# Patient Record
Sex: Female | Born: 1950 | Race: White | Hispanic: No | Marital: Married | Smoking: Former smoker
Health system: Southern US, Community
[De-identification: ages and names within clinical notes are randomized; demographics above are authoritative.]

## PROBLEM LIST (undated history)

## (undated) DIAGNOSIS — J449 Chronic obstructive pulmonary disease, unspecified: Secondary | ICD-10-CM

## (undated) DIAGNOSIS — E669 Obesity, unspecified: Secondary | ICD-10-CM

## (undated) DIAGNOSIS — K219 Gastro-esophageal reflux disease without esophagitis: Secondary | ICD-10-CM

## (undated) DIAGNOSIS — I480 Paroxysmal atrial fibrillation: Secondary | ICD-10-CM

## (undated) DIAGNOSIS — I4892 Unspecified atrial flutter: Secondary | ICD-10-CM

## (undated) DIAGNOSIS — I341 Nonrheumatic mitral (valve) prolapse: Secondary | ICD-10-CM

## (undated) DIAGNOSIS — K635 Polyp of colon: Secondary | ICD-10-CM

## (undated) DIAGNOSIS — I340 Nonrheumatic mitral (valve) insufficiency: Secondary | ICD-10-CM

## (undated) DIAGNOSIS — E7800 Pure hypercholesterolemia, unspecified: Secondary | ICD-10-CM

## (undated) HISTORY — DX: Polyp of colon: K63.5

## (undated) HISTORY — PX: TUBAL LIGATION: SHX77

## (undated) HISTORY — DX: Obesity, unspecified: E66.9

## (undated) HISTORY — PX: ABDOMINAL HYSTERECTOMY: SHX81

## (undated) HISTORY — DX: Unspecified atrial flutter: I48.92

## (undated) HISTORY — DX: Nonrheumatic mitral (valve) insufficiency: I34.0

---

## 2009-09-03 ENCOUNTER — Ambulatory Visit: Payer: Self-pay | Admitting: Gastroenterology

## 2009-09-03 DIAGNOSIS — K625 Hemorrhage of anus and rectum: Secondary | ICD-10-CM

## 2009-10-01 ENCOUNTER — Ambulatory Visit: Payer: Self-pay | Admitting: Gastroenterology

## 2009-10-01 ENCOUNTER — Ambulatory Visit (HOSPITAL_COMMUNITY): Admission: RE | Admit: 2009-10-01 | Discharge: 2009-10-01 | Payer: Self-pay | Admitting: Family Medicine

## 2009-10-01 LAB — HM COLONOSCOPY

## 2009-10-02 ENCOUNTER — Emergency Department (HOSPITAL_COMMUNITY): Admission: EM | Admit: 2009-10-02 | Discharge: 2009-10-02 | Payer: Self-pay | Admitting: Emergency Medicine

## 2009-10-05 ENCOUNTER — Encounter (INDEPENDENT_AMBULATORY_CARE_PROVIDER_SITE_OTHER): Payer: Self-pay

## 2009-12-03 ENCOUNTER — Encounter (INDEPENDENT_AMBULATORY_CARE_PROVIDER_SITE_OTHER): Payer: Self-pay | Admitting: *Deleted

## 2009-12-05 ENCOUNTER — Telehealth (INDEPENDENT_AMBULATORY_CARE_PROVIDER_SITE_OTHER): Payer: Self-pay | Admitting: *Deleted

## 2010-02-07 ENCOUNTER — Emergency Department (HOSPITAL_COMMUNITY): Admission: EM | Admit: 2010-02-07 | Discharge: 2010-02-07 | Payer: Self-pay | Admitting: Emergency Medicine

## 2010-12-03 NOTE — Progress Notes (Signed)
  Phone Note Call from Patient   Action Taken: Phone Call Completed Details for Reason: called regarding f/u appt  Summary of Call: Pt called stating she recieved letter in the mail for follow up appt. She stated that she was " doing fine & did not feel like she needed another appt at this time."  I advised pt to call back if anything changed. cdg   Initial call taken by: Peggyann Shoals,  December 05, 2009 1:21 PM

## 2010-12-03 NOTE — Letter (Signed)
Summary: Appointment Reminder  [facility]  [address]   [place], [place] [address]   Phone: [phone]  Fax: [phone]       December 03, 2009   [name] [address] [place], [place]  [address] 1951-04-29    Dear Ms. Burlingame,  We have been unable to reach you by phone to schedule a follow up   appointment that was recommended for you by Dr. Jena Gauss. It is very   important that we reach you to schedule an appointment. We hope that you  allow [place] to participate in your health care needs. Please contact us at  [phone] at your earliest convenience to schedule your appointment.  Sincerely,    [facility] R. Roetta Sessions, M.D.    Kassie Mends, M.D. Lorenza Burton, FNP-BC    Tana Coast, PA-C Phone: [phone]    Fax: [phone]

## 2011-01-22 LAB — CBC
HCT: 36.7 % (ref 36.0–46.0)
MCHC: 34.8 g/dL (ref 30.0–36.0)
MCV: 87.5 fL (ref 78.0–100.0)
Platelets: 221 10*3/uL (ref 150–400)
WBC: 14.4 10*3/uL — ABNORMAL HIGH (ref 4.0–10.5)

## 2011-01-22 LAB — COMPREHENSIVE METABOLIC PANEL
ALT: 22 U/L (ref 0–35)
Albumin: 3.6 g/dL (ref 3.5–5.2)
Alkaline Phosphatase: 75 U/L (ref 39–117)
CO2: 25 mEq/L (ref 19–32)
Creatinine, Ser: 0.74 mg/dL (ref 0.4–1.2)
GFR calc non Af Amer: >60 mL/min (ref >60)
Glucose, Bld: 149 mg/dL — ABNORMAL HIGH (ref 70–99)
Total Protein: 6.9 g/dL (ref 6.0–8.3)

## 2011-01-22 LAB — DIFFERENTIAL
Basophils Absolute: 0 10*3/uL (ref 0.0–0.1)
Basophils Relative: 0 % (ref 0–1)
Lymphs Abs: 0.5 10*3/uL — ABNORMAL LOW (ref 0.7–4.0)
Monocytes Relative: 2 % — ABNORMAL LOW (ref 3–12)
Neutrophils Relative %: 94 % — ABNORMAL HIGH (ref 43–77)

## 2011-01-22 LAB — URINALYSIS, ROUTINE W REFLEX MICROSCOPIC
Bilirubin Urine: NEGATIVE
Ketones, ur: NEGATIVE mg/dL
Specific Gravity, Urine: 1.015 (ref 1.005–1.030)
pH: 7.5 (ref 5.0–8.0)

## 2013-08-14 ENCOUNTER — Emergency Department (HOSPITAL_COMMUNITY): Payer: BC Managed Care – PPO

## 2013-08-14 ENCOUNTER — Encounter (HOSPITAL_COMMUNITY): Payer: Self-pay | Admitting: Emergency Medicine

## 2013-08-14 ENCOUNTER — Inpatient Hospital Stay (HOSPITAL_COMMUNITY)
Admission: EM | Admit: 2013-08-14 | Discharge: 2013-08-16 | DRG: 584 | Disposition: A | Discharge status: Home / Self Care | Payer: BC Managed Care – PPO | Attending: Internal Medicine | Admitting: Internal Medicine

## 2013-08-14 DIAGNOSIS — E669 Obesity, unspecified: Secondary | ICD-10-CM | POA: Diagnosis present

## 2013-08-14 DIAGNOSIS — A419 Sepsis, unspecified organism: Principal | ICD-10-CM

## 2013-08-14 DIAGNOSIS — E7800 Pure hypercholesterolemia, unspecified: Secondary | ICD-10-CM | POA: Diagnosis present

## 2013-08-14 DIAGNOSIS — D649 Anemia, unspecified: Secondary | ICD-10-CM

## 2013-08-14 DIAGNOSIS — K219 Gastro-esophageal reflux disease without esophagitis: Secondary | ICD-10-CM | POA: Diagnosis present

## 2013-08-14 DIAGNOSIS — K625 Hemorrhage of anus and rectum: Secondary | ICD-10-CM

## 2013-08-14 DIAGNOSIS — D72829 Elevated white blood cell count, unspecified: Secondary | ICD-10-CM | POA: Diagnosis present

## 2013-08-14 DIAGNOSIS — E538 Deficiency of other specified B group vitamins: Secondary | ICD-10-CM | POA: Diagnosis present

## 2013-08-14 DIAGNOSIS — J449 Chronic obstructive pulmonary disease, unspecified: Secondary | ICD-10-CM | POA: Diagnosis present

## 2013-08-14 DIAGNOSIS — I059 Rheumatic mitral valve disease, unspecified: Secondary | ICD-10-CM | POA: Diagnosis present

## 2013-08-14 DIAGNOSIS — J189 Pneumonia, unspecified organism: Secondary | ICD-10-CM | POA: Diagnosis present

## 2013-08-14 DIAGNOSIS — J9611 Chronic respiratory failure with hypoxia: Secondary | ICD-10-CM | POA: Diagnosis present

## 2013-08-14 DIAGNOSIS — J9600 Acute respiratory failure, unspecified whether with hypoxia or hypercapnia: Secondary | ICD-10-CM

## 2013-08-14 DIAGNOSIS — R6510 Systemic inflammatory response syndrome (SIRS) of non-infectious origin without acute organ dysfunction: Secondary | ICD-10-CM

## 2013-08-14 DIAGNOSIS — J9691 Respiratory failure, unspecified with hypoxia: Secondary | ICD-10-CM

## 2013-08-14 DIAGNOSIS — T380X5A Adverse effect of glucocorticoids and synthetic analogues, initial encounter: Secondary | ICD-10-CM | POA: Diagnosis present

## 2013-08-14 DIAGNOSIS — J4489 Other specified chronic obstructive pulmonary disease: Secondary | ICD-10-CM | POA: Diagnosis present

## 2013-08-14 DIAGNOSIS — I4891 Unspecified atrial fibrillation: Secondary | ICD-10-CM | POA: Diagnosis present

## 2013-08-14 DIAGNOSIS — Z8249 Family history of ischemic heart disease and other diseases of the circulatory system: Secondary | ICD-10-CM

## 2013-08-14 DIAGNOSIS — Z87891 Personal history of nicotine dependence: Secondary | ICD-10-CM

## 2013-08-14 DIAGNOSIS — E876 Hypokalemia: Secondary | ICD-10-CM | POA: Diagnosis present

## 2013-08-14 HISTORY — DX: Pure hypercholesterolemia, unspecified: E78.00

## 2013-08-14 HISTORY — DX: Paroxysmal atrial fibrillation: I48.0

## 2013-08-14 HISTORY — DX: Chronic obstructive pulmonary disease, unspecified: J44.9

## 2013-08-14 HISTORY — DX: Nonrheumatic mitral (valve) prolapse: I34.1

## 2013-08-14 LAB — COMPREHENSIVE METABOLIC PANEL
ALT: 17 U/L (ref 0–35)
AST: 20 U/L (ref 0–37)
Alkaline Phosphatase: 73 U/L (ref 39–117)
CO2: 28 mEq/L (ref 19–32)
GFR calc Af Amer: >90 mL/min (ref >90)
GFR calc non Af Amer: 88 mL/min — ABNORMAL LOW (ref >90)
Potassium: 3.8 mEq/L (ref 3.5–5.1)
Total Bilirubin: 1.2 mg/dL (ref 0.3–1.2)

## 2013-08-14 LAB — URINALYSIS, ROUTINE W REFLEX MICROSCOPIC
Specific Gravity, Urine: 1.015 (ref 1.005–1.030)
pH: 6.5 (ref 5.0–8.0)

## 2013-08-14 LAB — CBC WITH DIFFERENTIAL/PLATELET
Basophils Absolute: 0 10*3/uL (ref 0.0–0.1)
Basophils Relative: 0 % (ref 0–1)
Eosinophils Absolute: 0 10*3/uL (ref 0.0–0.7)
Lymphocytes Relative: 5 % — ABNORMAL LOW (ref 12–46)
MCH: 30.1 pg (ref 26.0–34.0)
MCHC: 33.2 g/dL (ref 30.0–36.0)
MCV: 90.8 fL (ref 78.0–100.0)
Monocytes Relative: 4 % (ref 3–12)
Neutro Abs: 15.9 10*3/uL — ABNORMAL HIGH (ref 1.7–7.7)
Platelets: 229 10*3/uL (ref 150–400)
RBC: 4.35 MIL/uL (ref 3.87–5.11)
WBC: 17.5 10*3/uL — ABNORMAL HIGH (ref 4.0–10.5)

## 2013-08-14 MED ORDER — LEVOFLOXACIN IN D5W 750 MG/150ML IV SOLN
750.0000 mg | INTRAVENOUS | Status: DC
  Administered 2013-08-15 – 2013-08-16 (×2): 750 mg via INTRAVENOUS
  Filled 2013-08-14 (×2): qty 150

## 2013-08-14 MED ORDER — DEXTROSE 5 % IV SOLN
1.0000 g | Freq: Once | INTRAVENOUS | Status: AC
  Administered 2013-08-14: 1 g via INTRAVENOUS
  Filled 2013-08-14: qty 10

## 2013-08-14 MED ORDER — ONDANSETRON HCL 4 MG/2ML IJ SOLN: 4.0000 mg | Freq: Four times a day (QID) | INTRAMUSCULAR | Status: DC | PRN

## 2013-08-14 MED ORDER — ONDANSETRON HCL 4 MG PO TABS: 4.0000 mg | ORAL_TABLET | Freq: Four times a day (QID) | ORAL | Status: DC | PRN

## 2013-08-14 MED ORDER — SODIUM CHLORIDE 0.9 % IV BOLUS (SEPSIS)
1000.0000 mL | Freq: Once | INTRAVENOUS | Status: AC
  Administered 2013-08-14: 1000 mL via INTRAVENOUS

## 2013-08-14 MED ORDER — ALBUTEROL SULFATE (5 MG/ML) 0.5% IN NEBU
5.0000 mg | INHALATION_SOLUTION | RESPIRATORY_TRACT | Status: AC
  Administered 2013-08-14: 5 mg via RESPIRATORY_TRACT
  Filled 2013-08-14: qty 1

## 2013-08-14 MED ORDER — SODIUM CHLORIDE 0.9 % IJ SOLN
3.0000 mL | Freq: Two times a day (BID) | INTRAMUSCULAR | Status: DC
  Administered 2013-08-15: 3 mL via INTRAVENOUS

## 2013-08-14 MED ORDER — VANCOMYCIN HCL IN DEXTROSE 1-5 GM/200ML-% IV SOLN
1000.0000 mg | Freq: Once | INTRAVENOUS | Status: DC
  Administered 2013-08-14: 1000 mg via INTRAVENOUS
  Filled 2013-08-14: qty 200

## 2013-08-14 MED ORDER — DEXTROSE 5 % IV SOLN
500.0000 mg | INTRAVENOUS | Status: DC
  Administered 2013-08-14: 500 mg via INTRAVENOUS

## 2013-08-14 MED ORDER — SODIUM CHLORIDE 0.9 % IV SOLN
INTRAVENOUS | Status: AC
  Administered 2013-08-14: 21:00:00 via INTRAVENOUS

## 2013-08-14 MED ORDER — ALBUTEROL SULFATE (5 MG/ML) 0.5% IN NEBU
2.5000 mg | INHALATION_SOLUTION | RESPIRATORY_TRACT | Status: DC | PRN
  Filled 2013-08-14: qty 0.5

## 2013-08-14 MED ORDER — ACETAMINOPHEN 500 MG PO TABS
1000.0000 mg | ORAL_TABLET | Freq: Once | ORAL | Status: AC
  Administered 2013-08-14: 1000 mg via ORAL
  Filled 2013-08-14: qty 2

## 2013-08-14 MED ORDER — SODIUM CHLORIDE 0.9 % IV BOLUS (SEPSIS)
500.0000 mL | Freq: Once | INTRAVENOUS | Status: AC
  Administered 2013-08-14: 500 mL via INTRAVENOUS

## 2013-08-14 MED ORDER — MOMETASONE FURO-FORMOTEROL FUM 100-5 MCG/ACT IN AERO
INHALATION_SPRAY | RESPIRATORY_TRACT | Status: AC
  Filled 2013-08-14: qty 8.8

## 2013-08-14 MED ORDER — ONDANSETRON HCL 4 MG/2ML IJ SOLN
4.0000 mg | Freq: Once | INTRAMUSCULAR | Status: AC
Start: 2013-08-14 — End: 2013-08-14
  Administered 2013-08-14: 4 mg via INTRAVENOUS
  Filled 2013-08-14: qty 2

## 2013-08-14 MED ORDER — ACETAMINOPHEN 650 MG RE SUPP: 650.0000 mg | Freq: Four times a day (QID) | RECTAL | Status: DC | PRN

## 2013-08-14 MED ORDER — ALBUTEROL SULFATE (5 MG/ML) 0.5% IN NEBU
2.5000 mg | INHALATION_SOLUTION | Freq: Four times a day (QID) | RESPIRATORY_TRACT | Status: DC
  Administered 2013-08-14 – 2013-08-16 (×7): 2.5 mg via RESPIRATORY_TRACT
  Filled 2013-08-14 (×6): qty 0.5

## 2013-08-14 MED ORDER — SODIUM CHLORIDE 0.9 % IV SOLN
INTRAVENOUS | Status: AC
  Administered 2013-08-14: 20:00:00 via INTRAVENOUS

## 2013-08-14 MED ORDER — GUAIFENESIN ER 600 MG PO TB12
600.0000 mg | ORAL_TABLET | Freq: Two times a day (BID) | ORAL | Status: DC
  Administered 2013-08-14 – 2013-08-16 (×4): 600 mg via ORAL
  Filled 2013-08-14 (×4): qty 1

## 2013-08-14 MED ORDER — HYDROCORTISONE SOD SUCCINATE 100 MG IJ SOLR
50.0000 mg | Freq: Four times a day (QID) | INTRAMUSCULAR | Status: DC
  Administered 2013-08-14 – 2013-08-15 (×3): 50 mg via INTRAVENOUS
  Filled 2013-08-14 (×3): qty 2

## 2013-08-14 MED ORDER — TIOTROPIUM BROMIDE MONOHYDRATE 18 MCG IN CAPS
18.0000 ug | ORAL_CAPSULE | Freq: Every day | RESPIRATORY_TRACT | Status: DC
  Administered 2013-08-15 – 2013-08-16 (×2): 18 ug via RESPIRATORY_TRACT
  Filled 2013-08-14: qty 5

## 2013-08-14 MED ORDER — MONTELUKAST SODIUM 10 MG PO TABS
10.0000 mg | ORAL_TABLET | Freq: Every day | ORAL | Status: DC
  Administered 2013-08-15: 10 mg via ORAL
  Filled 2013-08-14: qty 1

## 2013-08-14 MED ORDER — ENOXAPARIN SODIUM 40 MG/0.4ML ~~LOC~~ SOLN
40.0000 mg | SUBCUTANEOUS | Status: DC
  Administered 2013-08-14 – 2013-08-15 (×2): 40 mg via SUBCUTANEOUS
  Filled 2013-08-14 (×3): qty 0.4

## 2013-08-14 MED ORDER — TIOTROPIUM BROMIDE MONOHYDRATE 18 MCG IN CAPS
ORAL_CAPSULE | RESPIRATORY_TRACT | Status: AC
  Filled 2013-08-14: qty 5

## 2013-08-14 MED ORDER — MOMETASONE FURO-FORMOTEROL FUM 100-5 MCG/ACT IN AERO
2.0000 | INHALATION_SPRAY | Freq: Two times a day (BID) | RESPIRATORY_TRACT | Status: DC
  Administered 2013-08-14 – 2013-08-16 (×4): 2 via RESPIRATORY_TRACT
  Filled 2013-08-14: qty 8.8

## 2013-08-14 MED ORDER — ACETAMINOPHEN 325 MG PO TABS
650.0000 mg | ORAL_TABLET | Freq: Four times a day (QID) | ORAL | Status: DC | PRN
  Administered 2013-08-16: 650 mg via ORAL
  Filled 2013-08-14: qty 2

## 2013-08-14 MED ORDER — ASPIRIN 325 MG PO TABS
325.0000 mg | ORAL_TABLET | Freq: Every day | ORAL | Status: DC
  Administered 2013-08-15 – 2013-08-16 (×2): 325 mg via ORAL
  Filled 2013-08-14 (×2): qty 1

## 2013-08-14 NOTE — ED Provider Notes (Addendum)
CSN: [id number]     Arrival date & time 08/14/13  1621 History   First MD Initiated Contact with Patient 08/14/13 1639    Scribed for Vida Roller, MD, the patient was seen in room APA01/APA01. This chart was scribed by Lewanda Rife, ED scribe. Patient's care was started at 6:24 PM  Chief Complaint  Patient presents with  . Emesis  . Shortness of Breath   (Consider location/radiation/quality/duration/timing/severity/associated sxs/prior Treatment) The history is provided by the patient. No language interpreter was used.   HPI Comments: [name] is a 62 y.o. female who presents to the Emergency Department with PMHx of COPD, and mitral valve prolapse complaining of constant nausea onset 9 am this morning upon waking up. Reports associated chills, fever, shortness of breath, cough, loose stools (x 2), nasal congestion, headaches, and otalgia.  Denies any aggravating or alleviating factors. Denies associated emesis, dysuria, bloody stools, sore throat, abdominal pain, and rash. Denies recent foreign travels.   On his breath started this morning at 9:00, persistent, severe, nothing makes it better or worse. Past Medical History  Diagnosis Date  . COPD (chronic obstructive pulmonary disease)   . Hypercholesteremia   . Mitral valve prolapse    Past Surgical History  Procedure Laterality Date  . Abdominal hysterectomy    . Cesarean section     History reviewed. No pertinent family history. History  Substance Use Topics  . Smoking status: Former Games developer  . Smokeless tobacco: Not on file  . Alcohol Use: No   OB History   Grav Para Term Preterm Abortions TAB SAB Ect Mult Living                 Review of Systems  Constitutional: Positive for fever.  HENT: Positive for ear pain.   Respiratory: Positive for cough and shortness of breath.   Gastrointestinal: Positive for nausea. Negative for vomiting and abdominal pain.  Genitourinary: Negative for dysuria.  Skin:  Negative for rash.  Neurological: Positive for headaches.  Psychiatric/Behavioral: Negative for confusion.  All other systems reviewed and are negative.   A complete 10 system review of systems was obtained and all systems are negative except as noted in the HPI and PMHx.    Allergies  Penicillins  Home Medications   Current Outpatient Rx  Name  Route  Sig  Dispense  Refill  . albuterol (PROAIR HFA) 108 (90 BASE) MCG/ACT inhaler   Inhalation   Inhale 2 puffs into the lungs every 6 (six) hours as needed for wheezing or shortness of breath.         Marland Kitchen aspirin 325 MG tablet   Oral   Take 325 mg by mouth daily.         . Cholecalciferol (VITAMIN D3) 5000 UNITS CAPS   Oral   Take 1 capsule by mouth daily.         Marland Kitchen CRANBERRY PO   Oral   Take 1 capsule by mouth daily.         . Fluticasone-Salmeterol (ADVAIR) 250-50 MCG/DOSE AEPB   Inhalation   Inhale 1 puff into the lungs every 12 (twelve) hours.         Marland Kitchen levofloxacin (LEVAQUIN) 500 MG tablet   Oral   Take 500 mg by mouth daily.         . meloxicam (MOBIC) 7.5 MG tablet   Oral   Take 7.5 mg by mouth daily.         . metoprolol (  LOPRESSOR) 50 MG tablet   Oral   Take 25-50 mg by mouth 2 (two) times daily. Patient takes 1 tablet in the morning and 1/2 tablet at night         . montelukast (SINGULAIR) 10 MG tablet   Oral   Take 10 mg by mouth at bedtime.         . niacin (NIASPAN) 500 MG CR tablet   Oral   Take 500 mg by mouth at bedtime.         . Omega-3 Krill Oil 300 MG CAPS   Oral   Take 1 capsule by mouth daily.         . predniSONE (DELTASONE) 10 MG tablet   Oral   Take 10 mg by mouth daily.         Marland Kitchen tiotropium (SPIRIVA) 18 MCG inhalation capsule   Inhalation   Place 18 mcg into inhaler and inhale daily.          BP 117/64  Pulse 102  Temp(Src) 101.2 F (38.4 C) (Oral)  Resp 22  Ht 5\' 6"  (1.676 m)  Wt 211 lb (95.709 kg)  BMI 34.07 kg/m2  SpO2 96% Physical Exam   Nursing note and vitals reviewed. Constitutional: She is oriented to person, place, and time. She appears well-developed and well-nourished. No distress.  Appears uncomfortable. Obese.   HENT:  Head: Normocephalic and atraumatic.  Eyes: EOM are normal.  Neck: Normal range of motion. Neck supple. No tracheal deviation present.  No meningismus   Cardiovascular: Regular rhythm.  Tachycardia present.   No murmur heard. Pulmonary/Chest: Effort normal. Not tachypneic. No respiratory distress. She has rales.  Right lower lobe with occasional crackles - slight retractions, mild tachypnea, hypoxia to 87% on room air on initial arrival, up to 92% with deep breathing  Abdominal: Soft. There is no tenderness.  Musculoskeletal: Normal range of motion.  Lymphadenopathy:    She has no cervical adenopathy.  Neurological: She is alert and oriented to person, place, and time.  Skin: Skin is warm and dry.  Psychiatric: She has a normal mood and affect. Her behavior is normal.    ED Course  Procedures (including critical care time) DIAGNOSTIC STUDIES: Oxygen Saturation is 91% on room air, adequate by my interpretation.    COORDINATION OF CARE:  Nursing notes reviewed. Vital signs reviewed. Initial pt interview and examination performed.   6:24 PM-Discussed work up plan with pt at bedside, which includes UA, CBC with diff panel, and CMP. Pt agrees with plan.   Treatment plan initiated: Medications  cefTRIAXone (ROCEPHIN) 1 g in dextrose 5 % 50 mL IVPB (1 g Intravenous New Bag/Given 08/14/13 1805)  azithromycin (ZITHROMAX) 500 mg in dextrose 5 % 250 mL IVPB (not administered)  vancomycin (VANCOCIN) IVPB 1000 mg/200 mL premix (not administered)  ondansetron (ZOFRAN) injection 4 mg (4 mg Intravenous Given 08/14/13 1715)  sodium chloride 0.9 % bolus 1,000 mL (0 mLs Intravenous Stopped 08/14/13 1805)  acetaminophen (TYLENOL) tablet 1,000 mg (1,000 mg Oral Given 08/14/13 1717)  albuterol (PROVENTIL)  (5 MG/ML) 0.5% nebulizer solution 5 mg (5 mg Nebulization Given 08/14/13 1725)     Initial diagnostic testing ordered.    Labs Review Labs Reviewed  CBC WITH DIFFERENTIAL - Abnormal; Notable for the following:    WBC 17.5 (*)    Neutrophils Relative % 91 (*)    Neutro Abs 15.9 (*)    Lymphocytes Relative 5 (*)    All other components within normal  limits  COMPREHENSIVE METABOLIC PANEL - Abnormal; Notable for the following:    Sodium 134 (*)    Chloride 94 (*)    Glucose, Bld 150 (*)    Albumin 3.3 (*)    GFR calc non Af Amer 88 (*)    All other components within normal limits  URINE CULTURE  CULTURE, BLOOD (ROUTINE X 2)  CULTURE, BLOOD (ROUTINE X 2)  LACTIC ACID, PLASMA  URINALYSIS, ROUTINE W REFLEX MICROSCOPIC   Imaging Review Dg Chest 2 View  08/14/2013   CLINICAL DATA:  Mitral valve prolapse. Nausea and vomiting with weakness.  EXAM: CHEST  2 VIEW  COMPARISON:  07/29/2013.  FINDINGS: Normal heart size. Clear left lung. Patchy infiltrates involve the right upper and right middle lobe without effusion or pneumothorax. Stable mild hyperinflation. No osseous findings. Worsened aeration from priors.  IMPRESSION: Early right upper and right middle lobe infiltrates, new from priors.   Electronically Signed   By: Davonna Belling M.D.   On: 08/14/2013 17:02    EKG Interpretation   None       MDM   1. Sepsis   2. CAP (community acquired pneumonia)    Is in respiratory distress with tachypnea, retractions and hypoxia. She has a fever, tachycardia and has a leukocytosis of more than 17,000. This is consistent with systemic inflammatory response syndrome.  On two-view chest x-ray PA and lateral views revealed that she has right-sided infiltrates consistent with multifocal pneumonia which is likely the source of her infection. This pushes her diagnosis into a sepsis category and makes me much more concerned about the patient. Lactic acid  is 2.0, oxygenation is improved with 2 L nasal  cannula and after nebulized treatment she is improved with decreased work of breathing.  Reexamined multiple times with improvement each time.  I discussed her care with the hospitalist who agrees that the patient needs to be admitted to the hospital, and has given permission for holding orders, broad-spectrum antibiotics ordered, care discussed with the patient who is in agreement with the plan  CRITICAL CARE Performed by: Vida Roller Total critical care time: 35 Critical care time was exclusive of separately billable procedures and treating other patients. Critical care was necessary to treat or prevent imminent or life-threatening deterioration. Critical care was time spent personally by me on the following activities: development of treatment plan with patient and/or surrogate as well as nursing, discussions with consultants, evaluation of patient's response to treatment, examination of patient, obtaining history from patient or surrogate, ordering and performing treatments and interventions, ordering and review of laboratory studies, ordering and review of radiographic studies, pulse oximetry and re-evaluation of patient's condition.    I personally performed the services described in this documentation, which was scribed in my presence. The recorded information has been reviewed and is accurate.     Vida Roller, MD 08/14/13 1824  Vida Roller, MD 08/14/13 [phone]

## 2013-08-14 NOTE — ED Notes (Signed)
Pt c/o chills, nausea.no vomiting.  States woke up like this this morning. Nad. Alert/oriented. Sob noted with labored breathing noted. Cough with green sputum.

## 2013-08-14 NOTE — H&P (Signed)
[facility] History and Physical  [name] MVH:[id number] DOB: 08-01-1951 DOA: 08/14/2013   PCP: Samuel Jester, DO  Specialists: None  Chief Complaint: Shortness of breath, cough, chills since this morning  HPI: [name] is a 62 y.o. female with a past medical history of COPD, hypercholesterolemia, mitral valve prolapse, who was in her usual state of health this morning when she woke up and took a bath and then subsequently started feeling weak and short of breath. She started having a cough, which was mostly dry. She does have a chronic cough, but this was worse than normal. She had chills and thinks she may have had a fever, but did not check her temperature at home. She had the nausea, with dry heaves. She denies any dizziness or lightheadedness. No sick contacts including persons from [place]. No recent travel. She is due for a flu shot this year and thinks she is up-to-date on pneumonia vaccine, although she is not sure. She denies any chest pain, no abdominal pain. No diarrhea. She had Levaquin left over from a previous prescription, so she took one tablet this morning. She has been taking prednisone on a tapering dose for the last one month for her COPD and has a few tablets left.  Home Medications: Prior to Admission medications   Medication Sig Start Date End Date Taking? Authorizing Provider  albuterol (PROAIR HFA) 108 (90 BASE) MCG/ACT inhaler Inhale 2 puffs into the lungs every 6 (six) hours as needed for wheezing or shortness of breath.   Yes Historical Provider, MD  aspirin 325 MG tablet Take 325 mg by mouth daily.   Yes Historical Provider, MD  Cholecalciferol (VITAMIN D3) 5000 UNITS CAPS Take 1 capsule by mouth daily.   Yes Historical Provider, MD  CRANBERRY PO Take 1 capsule by mouth daily.   Yes Historical Provider, MD  Fluticasone-Salmeterol (ADVAIR) 250-50 MCG/DOSE AEPB Inhale 1 puff into the lungs every 12 (twelve) hours.   Yes Historical Provider, MD   levofloxacin (LEVAQUIN) 500 MG tablet Take 500 mg by mouth daily.   Yes Historical Provider, MD  meloxicam (MOBIC) 7.5 MG tablet Take 7.5 mg by mouth daily.   Yes Historical Provider, MD  metoprolol (LOPRESSOR) 50 MG tablet Take 25-50 mg by mouth 2 (two) times daily. Patient takes 1 tablet in the morning and 1/2 tablet at night   Yes Historical Provider, MD  montelukast (SINGULAIR) 10 MG tablet Take 10 mg by mouth at bedtime.   Yes Historical Provider, MD  niacin (NIASPAN) 500 MG CR tablet Take 500 mg by mouth at bedtime.   Yes Historical Provider, MD  Omega-3 Krill Oil 300 MG CAPS Take 1 capsule by mouth daily.   Yes Historical Provider, MD  predniSONE (DELTASONE) 10 MG tablet Take 10 mg by mouth daily.   Yes Historical Provider, MD  tiotropium (SPIRIVA) 18 MCG inhalation capsule Place 18 mcg into inhaler and inhale daily.   Yes Historical Provider, MD    Allergies:  Allergies  Allergen Reactions  . Penicillins Rash    Past Medical History: Past Medical History  Diagnosis Date  . COPD (chronic obstructive pulmonary disease)   . Hypercholesteremia   . Mitral valve prolapse     Past Surgical History  Procedure Laterality Date  . Abdominal hysterectomy    . Cesarean section      Social History:  reports that she quit smoking about 23 years ago. She does not have any smokeless tobacco history on file. She reports  that she does not drink alcohol or use illicit drugs.  Living Situation: She lives in [place] with her husband. She is employed as a Environmental health practitioner Activity Level: Independent with daily activities   Family History:  Family History  Problem Relation Age of Onset  . Heart attack Mother   . Emphysema Father      Review of Systems - History obtained from the patient General ROS: positive for  - chills and fatigue Psychological ROS: negative Ophthalmic ROS: negative ENT ROS: negative Allergy and Immunology ROS: negative Hematological and Lymphatic ROS:  negative Endocrine ROS: negative Respiratory ROS: as in hpi Cardiovascular ROS: no chest pain or dyspnea on exertion Gastrointestinal ROS: no abdominal pain, change in bowel habits, or black or bloody stools Genito-Urinary ROS: no dysuria, trouble voiding, or hematuria Musculoskeletal ROS: negative Neurological ROS: no TIA or stroke symptoms Dermatological ROS: negative  Physical Examination  Filed Vitals:   08/14/13 1805 08/14/13 1837 08/14/13 1839 08/14/13 1916  BP: 117/64 85/49 80/46  94/61  Pulse: 102 102 105 90  Temp:    98.3 F (36.8 C)  TempSrc:      Resp: 22 20 19 18   Height:    5\' 6"  (1.676 m)  Weight:    97.7 kg (215 lb 6.2 oz)  SpO2: 96% 94% 95% 97%    General appearance: alert, cooperative, appears stated age, no distress and moderately obese Head: Normocephalic, without obvious abnormality, atraumatic Eyes: conjunctivae/corneas clear. PERRL, EOM's intact. Throat: Dry mucous membranes Neck: no adenopathy, no carotid bruit, no JVD, supple, symmetrical, trachea midline and thyroid not enlarged, symmetric, no tenderness/mass/nodules Resp: She has rhonchi and a few crackles at the right lower lung. Few scattered wheezes. Cardio: regular rate and rhythm, S1, S2 normal, no murmur, click, rub or gallop GI: soft, non-tender; bowel sounds normal; no masses,  no organomegaly Extremities: extremities normal, atraumatic, no cyanosis or edema Pulses: 2+ and symmetric Skin: Skin color, texture, turgor normal. No rashes or lesions Neurologic: She is alert and oriented x3. Cranial nerves are intact. Motor strength is equal, bilateral upper and lower extremities.  Laboratory Data: Results for orders placed during the hospital encounter of 08/14/13 (from the past 48 hour(s))  CBC WITH DIFFERENTIAL     Status: Abnormal   Collection Time    08/14/13  5:16 PM      Result Value Range   WBC 17.5 (*) 4.0 - 10.5 K/uL   RBC 4.35  3.87 - 5.11 MIL/uL   Hemoglobin 13.1  12.0 - 15.0 g/dL    HCT 96.0  45.4 - 09.8 %   MCV 90.8  78.0 - 100.0 fL   MCH 30.1  26.0 - 34.0 pg   MCHC 33.2  30.0 - 36.0 g/dL   RDW 11.9  14.7 - 82.9 %   Platelets 229  150 - 400 K/uL   Neutrophils Relative % 91 (*) 43 - 77 %   Neutro Abs 15.9 (*) 1.7 - 7.7 K/uL   Lymphocytes Relative 5 (*) 12 - 46 %   Lymphs Abs 0.8  0.7 - 4.0 K/uL   Monocytes Relative 4  3 - 12 %   Monocytes Absolute 0.8  0.1 - 1.0 K/uL   Eosinophils Relative 0  0 - 5 %   Eosinophils Absolute 0.0  0.0 - 0.7 K/uL   Basophils Relative 0  0 - 1 %   Basophils Absolute 0.0  0.0 - 0.1 K/uL  COMPREHENSIVE METABOLIC PANEL     Status: Abnormal  Collection Time    08/14/13  5:16 PM      Result Value Range   Sodium 134 (*) 135 - 145 mEq/L   Potassium 3.8  3.5 - 5.1 mEq/L   Chloride 94 (*) 96 - 112 mEq/L   CO2 28  19 - 32 mEq/L   Glucose, Bld 150 (*) 70 - 99 mg/dL   BUN 12  6 - 23 mg/dL   Creatinine, Ser 4.09  0.50 - 1.10 mg/dL   Calcium 9.3  8.4 - 81.1 mg/dL   Total Protein 6.8  6.0 - 8.3 g/dL   Albumin 3.3 (*) 3.5 - 5.2 g/dL   AST 20  0 - 37 U/L   ALT 17  0 - 35 U/L   Alkaline Phosphatase 73  39 - 117 U/L   Total Bilirubin 1.2  0.3 - 1.2 mg/dL   GFR calc non Af Amer 88 (*) >90 mL/min   GFR calc Af Amer >90  >90 mL/min   Comment: (NOTE)     The eGFR has been calculated using the CKD EPI equation.     This calculation has not been validated in all clinical situations.     eGFR's persistently <90 mL/min signify possible Chronic Kidney     Disease.  LACTIC ACID, PLASMA     Status: None   Collection Time    08/14/13  5:17 PM      Result Value Range   Lactic Acid, Venous 2.0  0.5 - 2.2 mmol/L  CULTURE, BLOOD (ROUTINE X 2)     Status: None   Collection Time    08/14/13  5:30 PM      Result Value Range   Specimen Description BLOOD LEFT ARM     Special Requests BOTTLES DRAWN AEROBIC AND ANAEROBIC 6CC     Culture PENDING     Report Status PENDING    CULTURE, BLOOD (ROUTINE X 2)     Status: None   Collection Time    08/14/13   6:00 PM      Result Value Range   Specimen Description BLOOD LEFT HAND     Special Requests BOTTLES DRAWN AEROBIC AND ANAEROBIC 6CC     Culture PENDING     Report Status PENDING      Radiology Reports: Dg Chest 2 View  08/14/2013   CLINICAL DATA:  Mitral valve prolapse. Nausea and vomiting with weakness.  EXAM: CHEST  2 VIEW  COMPARISON:  07/29/2013.  FINDINGS: Normal heart size. Clear left lung. Patchy infiltrates involve the right upper and right middle lobe without effusion or pneumothorax. Stable mild hyperinflation. No osseous findings. Worsened aeration from priors.  IMPRESSION: Early right upper and right middle lobe infiltrates, new from priors.   Electronically Signed   By: Davonna Belling M.D.   On: 08/14/2013 17:02    Problem List  Principal Problem:   CAP (community acquired pneumonia) Active Problems:   COPD (chronic obstructive pulmonary disease)   Obesity, unspecified   Hypercholesteremia   Acute Respiratory failure with hypoxia   Assessment: This is a 62 year old, Caucasian female, who presents with shortness of breath, cough, chills. She was noted to have a fever of 102 in the emergency department. Chest x-ray is suggestive of pneumonia. This is most likely community-acquired pneumonia. She was noted to be hypoxic in the ED She has a history of COPD, and has been on steroids for the past one month. Blood pressure is somewhat on the lower side, but lactic acid level  was normal. She has SIRS.  Plan: #1 community-acquired pneumonia with hypoxic acute respiratory failure: She'll be treated with Levaquin. She took a dose of Levaquin this morning and is sure that she did not vomit it out. She was given ceftriaxone and azithromycin in the ED. We will start Levaquin from tomorrow morning. Strep and Legionella antigens will be checked. Oxygen will be continued for now. She'll be given IV fluids, and fluid boluses as needed.  #2 history of COPD: Continue with the nebulizer  treatments. Since she has been on steroids for the last one month we will give her stress dose hydrocortisone for now.  #3 history of mitral valve prolapse: She is on metoprolol which will be held for now due to her borderline low blood pressure.  DVT Prophylaxis: Lovenox Code Status: Full code Family Communication: Discussed with the patient  Disposition Plan: Admit to telemetry   Further management decisions will depend on results of further testing and patient's response to treatment.  [facility]  [facility] Pager [phone]  If 7PM-7AM, please contact night-coverage [email or web address] Password [facility]  08/14/2013, 8:31 PM

## 2013-08-15 LAB — COMPREHENSIVE METABOLIC PANEL
ALT: 14 U/L (ref 0–35)
AST: 17 U/L (ref 0–37)
CO2: 29 mEq/L (ref 19–32)
Chloride: 106 mEq/L (ref 96–112)
GFR calc Af Amer: >90 mL/min (ref >90)
GFR calc non Af Amer: 87 mL/min — ABNORMAL LOW (ref >90)
Glucose, Bld: 135 mg/dL — ABNORMAL HIGH (ref 70–99)
Potassium: 4.8 mEq/L (ref 3.5–5.1)
Sodium: 143 mEq/L (ref 135–145)
Total Bilirubin: 1.2 mg/dL (ref 0.3–1.2)

## 2013-08-15 LAB — CBC
MCH: 30.1 pg (ref 26.0–34.0)
MCHC: 32.6 g/dL (ref 30.0–36.0)
Platelets: 228 10*3/uL (ref 150–400)
RDW: 13.5 % (ref 11.5–15.5)

## 2013-08-15 LAB — STREP PNEUMONIAE URINARY ANTIGEN: Strep Pneumo Urinary Antigen: NEGATIVE

## 2013-08-15 MED ORDER — METOPROLOL TARTRATE 50 MG PO TABS
50.0000 mg | ORAL_TABLET | Freq: Every morning | ORAL | Status: DC
Start: 2013-08-16 — End: 2013-08-16
  Administered 2013-08-16: 50 mg via ORAL
  Filled 2013-08-15: qty 1

## 2013-08-15 MED ORDER — FAMOTIDINE 20 MG PO TABS
20.0000 mg | ORAL_TABLET | Freq: Two times a day (BID) | ORAL | Status: DC
  Administered 2013-08-15 – 2013-08-16 (×3): 20 mg via ORAL
  Filled 2013-08-15 (×3): qty 1

## 2013-08-15 MED ORDER — PREDNISONE 20 MG PO TABS
40.0000 mg | ORAL_TABLET | Freq: Every day | ORAL | Status: DC
  Administered 2013-08-16: 40 mg via ORAL
  Filled 2013-08-15: qty 2

## 2013-08-15 MED ORDER — METOPROLOL TARTRATE 25 MG PO TABS: 12.5000 mg | ORAL_TABLET | Freq: Two times a day (BID) | ORAL | Status: DC

## 2013-08-15 MED ORDER — METOPROLOL TARTRATE 25 MG PO TABS
25.0000 mg | ORAL_TABLET | ORAL | Status: AC
  Administered 2013-08-15: 25 mg via ORAL
  Filled 2013-08-15: qty 1

## 2013-08-15 MED ORDER — METOPROLOL TARTRATE 1 MG/ML IV SOLN
5.0000 mg | INTRAVENOUS | Status: AC
  Administered 2013-08-15: 5 mg via INTRAVENOUS

## 2013-08-15 MED ORDER — METOPROLOL TARTRATE 1 MG/ML IV SOLN
INTRAVENOUS | Status: AC
  Filled 2013-08-15: qty 5

## 2013-08-15 MED ORDER — METOPROLOL TARTRATE 25 MG PO TABS
25.0000 mg | ORAL_TABLET | Freq: Every evening | ORAL | Status: DC
  Administered 2013-08-15: 25 mg via ORAL
  Filled 2013-08-15: qty 1

## 2013-08-15 MED ORDER — METHYLPREDNISOLONE SODIUM SUCC 125 MG IJ SOLR
INTRAMUSCULAR | Status: AC
  Filled 2013-08-15: qty 2

## 2013-08-15 NOTE — Progress Notes (Signed)
UR chart review completed.  

## 2013-08-15 NOTE — Progress Notes (Signed)
Metoprolol 25 mg po given per orders.

## 2013-08-15 NOTE — Progress Notes (Signed)
Patient reports relief of chest pressure, reports feeling better, heart rate 117-140's on telemetry.

## 2013-08-15 NOTE — Care Management Note (Addendum)
    Page 1 of 1   08/16/2013     2:19:03 PM   CARE MANAGEMENT NOTE 08/16/2013  Patient:  [name], [name]   Account Number:  [id number]  Date Initiated:  08/15/2013  Documentation initiated by:  Sharrie Rothman  Subjective/Objective Assessment:   Pt admitted from home with pneumonia. Pt lives with her husband and will return home at discharge. Pt is independent with ADL's and still works a full time job.     Action/Plan:   No CM needs anticipated.   Anticipated DC Date:  08/17/2013   Anticipated DC Plan:  HOME/SELF CARE      DC Planning Services  CM consult      Choice offered to / List presented to:             Status of service:  Completed, signed off Medicare Important Message given?   (If response is "NO", the following Medicare IM given date fields will be blank) Date Medicare IM given:   Date Additional Medicare IM given:    Discharge Disposition:  HOME/SELF CARE  Per UR Regulation:    If discussed at Long Length of Stay Meetings, dates discussed:    Comments:  08/16/13 1420 Arlyss Queen, RN BSN CM Pt discharged home today. No CM needs noted.  08/15/13 1325 Arlyss Queen, RN BSN CM

## 2013-08-15 NOTE — Progress Notes (Signed)
Dr. Kerry Hough notified of patient's status at this time, patient's heart rate fluctuating in the 110's to 140's, BP checked per orders, 114/72, orders received to give medication if SBP>110.

## 2013-08-15 NOTE — Progress Notes (Signed)
[facility] PROGRESS NOTE  [name]:[id number] DOB: [birth year] DOA: 08/14/2013 PCP: Samuel Jester, DO  Assessment/Plan: 1. Acute respiratory failure. Patient was hypoxic on room air.  Appears to be improving.  Wean oxygen off as tolerated.  Secondary to pneumonia. 2. CAP.  Patient is on levaquin.  Clinically appears to be improving.  Will continue current treatments 3. COPD.  No signs of wheezing.  Patient has been on lower dose prednisone for last 2 weeks.  She does not appear to be septic/toxic. Will discontinue stress dose steroids and start prednisone. 4. History of mitral valve prolapse.  Restart metoprolol once blood pressure permits 5. GERD.  On pepcid. 6. Leukocytosis.  Likely related to steroids/infection.  Does not appear to be toxic.  Continue current treatments.  Code Status: full code Family Communication: discussed with patient Disposition Plan: discharge home when improved, hopefully tomorrow   Consultants:  none  Procedures:  none  Antibiotics:  levaquin  HPI/Subjective: Feeling better.  Breathing improving.  She has a productive cough  Objective: Filed Vitals:   08/15/13 0509  BP: 94/40  Pulse: 83  Temp: 98.3 F (36.8 C)  Resp: 18    Intake/Output Summary (Last 24 hours) at 08/15/13 1123 Last data filed at 08/15/13 0532  Gross per 24 hour  Intake 888.33 ml  Output   1000 ml  Net -111.67 ml   Filed Weights   08/14/13 1627 08/14/13 1916  Weight: 95.709 kg (211 lb) 97.7 kg (215 lb 6.2 oz)    Exam:   General:  NAD  Cardiovascular: S1, S2 RRR  Respiratory: coarse breath sounds at bases  Abdomen: soft, nt, nd, bs+  Musculoskeletal: no edema b/l   Data Reviewed: Basic Metabolic Panel:  Recent Labs Lab 08/14/13 1716 08/15/13 0442  NA 134* 143  K 3.8 4.8  CL 94* 106  CO2 28 29  GLUCOSE 150* 135*  BUN 12 12  CREATININE 0.73 0.77  CALCIUM 9.3 8.5   Liver Function Tests:  Recent Labs Lab 08/14/13 1716  08/15/13 0442  AST 20 17  ALT 17 14  ALKPHOS 73 68  BILITOT 1.2 1.2  PROT 6.8 5.9*  ALBUMIN 3.3* 2.7*   No results found for this basename: LIPASE, AMYLASE,  in the last 168 hours No results found for this basename: AMMONIA,  in the last 168 hours CBC:  Recent Labs Lab 08/14/13 1716 08/15/13 0442  WBC 17.5* 19.6*  NEUTROABS 15.9*  --   HGB 13.1 11.6*  HCT 39.5 35.6*  MCV 90.8 92.2  PLT 229 228   Cardiac Enzymes: No results found for this basename: CKTOTAL, CKMB, CKMBINDEX, TROPONINI,  in the last 168 hours BNP (last 3 results) No results found for this basename: PROBNP,  in the last 8760 hours CBG: No results found for this basename: GLUCAP,  in the last 168 hours  Recent Results (from the past 240 hour(s))  CULTURE, BLOOD (ROUTINE X 2)     Status: None   Collection Time    08/14/13  5:30 PM      Result Value Range Status   Specimen Description BLOOD LEFT ARM   Final   Special Requests BOTTLES DRAWN AEROBIC AND ANAEROBIC 6CC   Final   Culture NO GROWTH 1 DAY   Final   Report Status PENDING   Incomplete  CULTURE, BLOOD (ROUTINE X 2)     Status: None   Collection Time    08/14/13  6:00 PM      Result  Value Range Status   Specimen Description BLOOD LEFT HAND   Final   Special Requests BOTTLES DRAWN AEROBIC AND ANAEROBIC 6CC   Final   Culture NO GROWTH 1 DAY   Final   Report Status PENDING   Incomplete     Studies: Dg Chest 2 View  08/14/2013   CLINICAL DATA:  Mitral valve prolapse. Nausea and vomiting with weakness.  EXAM: CHEST  2 VIEW  COMPARISON:  07/29/2013.  FINDINGS: Normal heart size. Clear left lung. Patchy infiltrates involve the right upper and right middle lobe without effusion or pneumothorax. Stable mild hyperinflation. No osseous findings. Worsened aeration from priors.  IMPRESSION: Early right upper and right middle lobe infiltrates, new from priors.   Electronically Signed   By: Davonna Belling M.D.   On: 08/14/2013 17:02    Scheduled Meds: .  albuterol  2.5 mg Nebulization Q6H  . aspirin  325 mg Oral Daily  . enoxaparin (LOVENOX) injection  40 mg Subcutaneous Q24H  . famotidine  20 mg Oral BID  . guaiFENesin  600 mg Oral BID  . hydrocortisone sod succinate (SOLU-CORTEF) inj  50 mg Intravenous Q6H  . levofloxacin (LEVAQUIN) IV  750 mg Intravenous Q24H  . methylPREDNISolone sodium succinate      . mometasone-formoterol  2 puff Inhalation BID  . montelukast  10 mg Oral QHS  . sodium chloride  3 mL Intravenous Q12H  . tiotropium  18 mcg Inhalation Daily   Continuous Infusions:   Principal Problem:   CAP (community acquired pneumonia) Active Problems:   COPD (chronic obstructive pulmonary disease)   Obesity, unspecified   Hypercholesteremia   Acute Respiratory failure with hypoxia    Time spent:    Loran Fleet  [facility] Pager [phone]. If 7PM-7AM, please contact night-coverage at [email or web address], password [facility] 08/15/2013, 11:23 AM  LOS: 1 day

## 2013-08-15 NOTE — Progress Notes (Signed)
Patient's heart rate increased, running 130-190's on telemetry since 1425 when alerted by central telemetry, patient c/o having pressure in the chest, BP 122/67, Dr. Kerry Hough notified, orders received.

## 2013-08-15 NOTE — Progress Notes (Signed)
ANTIBIOTIC CONSULT NOTE - INITIAL  Pharmacy Consult for renal dose adjustments Indication: COPD exacerbation  Allergies  Allergen Reactions  . Penicillins Rash    Patient Measurements: Height: 5\' 6"  (167.6 cm) Weight: 215 lb 6.2 oz (97.7 kg) IBW/kg (Calculated) : 59.3  Vital Signs: Temp: 98.3 F (36.8 C) (10/13 0509) BP: 94/40 mmHg (10/13 0509) Pulse Rate: 83 (10/13 0509) Intake/Output from previous day: 10/12 0701 - 10/13 0700 In: 888.3 [I.V.:888.3] Out: 1000 [Urine:1000] Intake/Output from this shift:    Labs:  Recent Labs  08/14/13 1716 08/15/13 0442  WBC 17.5* 19.6*  HGB 13.1 11.6*  PLT 229 228  CREATININE 0.73 0.77   Estimated Creatinine Clearance: 83.8 ml/min (by C-G formula based on Cr of 0.77). No results found for this basename: VANCOTROUGH, Leodis Binet, VANCORANDOM, GENTTROUGH, GENTPEAK, GENTRANDOM, TOBRATROUGH, TOBRAPEAK, TOBRARND, AMIKACINPEAK, AMIKACINTROU, AMIKACIN,  in the last 72 hours   Microbiology: Recent Results (from the past 720 hour(s))  CULTURE, BLOOD (ROUTINE X 2)     Status: None   Collection Time    08/14/13  5:30 PM      Result Value Range Status   Specimen Description BLOOD LEFT ARM   Final   Special Requests BOTTLES DRAWN AEROBIC AND ANAEROBIC 6CC   Final   Culture PENDING   Incomplete   Report Status PENDING   Incomplete  CULTURE, BLOOD (ROUTINE X 2)     Status: None   Collection Time    08/14/13  6:00 PM      Result Value Range Status   Specimen Description BLOOD LEFT HAND   Final   Special Requests BOTTLES DRAWN AEROBIC AND ANAEROBIC 6CC   Final   Culture PENDING   Incomplete   Report Status PENDING   Incomplete    Medical History: Past Medical History  Diagnosis Date  . COPD (chronic obstructive pulmonary disease)   . Hypercholesteremia   . Mitral valve prolapse     Medications:  Scheduled:  . albuterol  2.5 mg Nebulization Q6H  . aspirin  325 mg Oral Daily  . enoxaparin (LOVENOX) injection  40 mg Subcutaneous  Q24H  . guaiFENesin  600 mg Oral BID  . hydrocortisone sod succinate (SOLU-CORTEF) inj  50 mg Intravenous Q6H  . levofloxacin (LEVAQUIN) IV  750 mg Intravenous Q24H  . methylPREDNISolone sodium succinate      . mometasone-formoterol  2 puff Inhalation BID  . montelukast  10 mg Oral QHS  . sodium chloride  3 mL Intravenous Q12H  . tiotropium  18 mcg Inhalation Daily   Assessment: 62 yo F admitted with CAP started on Levaquin.  She also has hx COPD.   Her WBC is increasing, but her lactic acid level is normal & she is afebrile. Her renal function is good & at her baseline.   Goal of Therapy:  Eradicate infection.  Plan:  Continue Levaquin 750mg  IV q24h Monitor renal function and cx data    Elson Clan 08/15/2013,8:12 AM

## 2013-08-15 NOTE — Progress Notes (Signed)
Lopressor 5 mg IV given, Lopressor 25 mg po given per orders, patient in no acute distress, heart rate fluctuating between 130-190 on telemetry.

## 2013-08-16 ENCOUNTER — Encounter (HOSPITAL_COMMUNITY): Payer: Self-pay | Admitting: Internal Medicine

## 2013-08-16 DIAGNOSIS — D72829 Elevated white blood cell count, unspecified: Secondary | ICD-10-CM | POA: Diagnosis present

## 2013-08-16 DIAGNOSIS — D649 Anemia, unspecified: Secondary | ICD-10-CM | POA: Diagnosis present

## 2013-08-16 DIAGNOSIS — E538 Deficiency of other specified B group vitamins: Secondary | ICD-10-CM | POA: Diagnosis present

## 2013-08-16 DIAGNOSIS — E876 Hypokalemia: Secondary | ICD-10-CM | POA: Diagnosis present

## 2013-08-16 LAB — CBC
Hemoglobin: 10.3 g/dL — ABNORMAL LOW (ref 12.0–15.0)
Platelets: 202 10*3/uL (ref 150–400)
RDW: 13.9 % (ref 11.5–15.5)
WBC: 12.4 10*3/uL — ABNORMAL HIGH (ref 4.0–10.5)

## 2013-08-16 LAB — LEGIONELLA ANTIGEN, URINE: Legionella Antigen, Urine: NEGATIVE

## 2013-08-16 LAB — BASIC METABOLIC PANEL
CO2: 27 mEq/L (ref 19–32)
Chloride: 103 mEq/L (ref 96–112)
Creatinine, Ser: 0.71 mg/dL (ref 0.50–1.10)
GFR calc Af Amer: >90 mL/min (ref >90)
Potassium: 3.1 mEq/L — ABNORMAL LOW (ref 3.5–5.1)
Sodium: 139 mEq/L (ref 135–145)

## 2013-08-16 MED ORDER — LEVOFLOXACIN 750 MG PO TABS: 750.0000 mg | ORAL_TABLET | Freq: Every day | ORAL | Status: DC

## 2013-08-16 MED ORDER — POTASSIUM CHLORIDE CRYS ER 20 MEQ PO TBCR
40.0000 meq | EXTENDED_RELEASE_TABLET | Freq: Once | ORAL | Status: AC
  Administered 2013-08-16: 40 meq via ORAL
  Filled 2013-08-16: qty 2

## 2013-08-16 MED ORDER — GUAIFENESIN ER 600 MG PO TB12: 600.0000 mg | ORAL_TABLET | Freq: Two times a day (BID) | ORAL | Status: DC

## 2013-08-16 MED ORDER — PREDNISONE 10 MG PO TABS: ORAL_TABLET | ORAL | Status: DC

## 2013-08-16 NOTE — Discharge Summary (Signed)
Physician Discharge Summary  [name]:[id number] DOB: Jan 14, 1951 DOA: 08/14/2013  PCP: Samuel Jester, DO  Admit date: 08/14/2013 Discharge date: 08/16/2013  Time spent: 40 minutes  Recommendations for Outpatient Follow-up:  1. Follow up with PCP  As scheduled 08/22/13. 2. Dr. Juluis Mire 08/30/13 evaluation of mitral valve prolapse. May benefit from updated echo. Also appears to have paroxysmal atrial fibrillation  Discharge Diagnoses:  Principal Problem:   CAP (community acquired pneumonia) Active Problems:   COPD (chronic obstructive pulmonary disease)   Obesity, unspecified   Hypercholesteremia   Acute Respiratory failure with hypoxia   Hypokalemia   Anemia   Leukocytosis, unspecified Paroxysmal atrial fibrillation  Discharge Condition: stable  Diet recommendation: heart healthy  Filed Weights   08/14/13 1627 08/14/13 1916  Weight: 95.709 kg (211 lb) 97.7 kg (215 lb 6.2 oz)    History of present illness:  [name]  a 62 y.o. female with a past medical history of COPD, hypercholesterolemia, mitral valve prolapse, who was in her usual state of health on 08/14/13 when she woke up and took a bath and then subsequently started feeling weak and short of breath. She started having a cough, which was mostly dry. She has hx of chronic cough, but this was worse than normal. She had chills and thought she may have had a fever, but did not check her temperature at home. She had the nausea, with dry heaves. She denied any dizziness or lightheadedness. No sick contacts including persons from [place]. No recent travel. She was due for a flu shot this year and thought she was up-to-date on pneumonia vaccine, although she was not sure. She denied any chest pain, no abdominal pain. No diarrhea. She had Levaquin left over from a previous prescription, so she took one tablet this morning. She had been taking prednisone on a tapering dose for the last one month for her COPD and  had a few tablets left.    Hospital Course:  1. Acute respiratory failure. Secondary to pneumonia.  Admitted to tele.  Patient was hypoxic on room air at admission. Provided with oxygen support, oral steroids and IV antibiotic. At discharge sats on room air at rest >90%. Sats on room air with ambulation >90%. 2. CAP. Patient on levaquin at admission. Given IV levaquin at 750mg  as well as oral steroids. Will be discharged with 5 more days of levaquin to complete 7 day course.  3. COPD. No signs of wheezing. Patient has been on lower dose prednisone for last 2 weeks. She was placed on prednisone taper in the hospital and has done well.  She dose not have any signs of wheezing.  She will be continued on prednisone taper and will follow up with primary care doctor. 4. History of mitral valve prolapse. Metoprolol held on admission. Resumed 08/15/13. Brief episode of rapid afib during this period that resolved once BB resumed. At discharge NSR.  5. GERD. Stable during this hospitalization. On pepcid. 6. Leukocytosis. Likely related to steroids/infection.Trending downward. Patient remained non-toxic during this hospitalization.     Procedures:  none  Consultations:  none  Discharge Exam: Filed Vitals:   08/16/13 1300  BP: 95/48  Pulse: 79  Temp: 98.3 F (36.8 C)  Resp:     General: obese sitting on side of bed NAD Cardiovascular: RRR No MGR No LE edema PPP Respiratory: normal effort BS slightly distant but clear  Discharge Instructions      Discharge Orders   Future Appointments Provider Department Dept  Phone   08/30/2013 8:40 AM Laqueta Linden, MD [facility] [place] [phone]   Future Orders Complete By Expires   Diet - low sodium heart healthy  As directed    Discharge instructions  As directed    Comments:     Change positions slowly.  Drink plenty of fluids Follow up with PCP as scheduled Follow up with cardiology as scheduled Take medication as directed    Increase activity slowly  As directed        Medication List         aspirin 325 MG tablet  Take 325 mg by mouth daily.     CRANBERRY PO  Take 1 capsule by mouth daily.     Fluticasone-Salmeterol 250-50 MCG/DOSE Aepb  Commonly known as:  ADVAIR  Inhale 1 puff into the lungs every 12 (twelve) hours.     guaiFENesin 600 MG 12 hr tablet  Commonly known as:  MUCINEX  Take 1 tablet (600 mg total) by mouth 2 (two) times daily.     levofloxacin 750 MG tablet  Commonly known as:  LEVAQUIN  Take 1 tablet (750 mg total) by mouth daily.     meloxicam 7.5 MG tablet  Commonly known as:  MOBIC  Take 7.5 mg by mouth daily.     metoprolol 50 MG tablet  Commonly known as:  LOPRESSOR  Take 25-50 mg by mouth 2 (two) times daily. Patient takes 1 tablet in the morning and 1/2 tablet at night     montelukast 10 MG tablet  Commonly known as:  SINGULAIR  Take 10 mg by mouth at bedtime.     niacin 500 MG CR tablet  Commonly known as:  NIASPAN  Take 500 mg by mouth at bedtime.     Omega-3 Krill Oil 300 MG Caps  Take 1 capsule by mouth daily.     predniSONE 10 MG tablet  Commonly known as:  DELTASONE  Take 4 tabs on 10/15 then take 3 tabs on 10/16 and 10/17 then take 2 tabs on 10/18 and 10/19 then take 1 tab 10/20 and 10/21 then stop     PROAIR HFA 108 (90 BASE) MCG/ACT inhaler  Generic drug:  albuterol  Inhale 2 puffs into the lungs every 6 (six) hours as needed for wheezing or shortness of breath.     tiotropium 18 MCG inhalation capsule  Commonly known as:  SPIRIVA  Place 18 mcg into inhaler and inhale daily.     Vitamin D3 5000 UNITS Caps  Take 1 capsule by mouth daily.       Allergies  Allergen Reactions  . Penicillins Rash   Follow-up Information   Follow up with BUTLER, CYNTHIA, DO On 08/22/2013. (has appointment at 1:45pm for evaluation of symptoms)    Contact information:   [address] [place] [address] [phone]       Follow up with Laqueta Linden, MD On 08/30/2013. (8:40am for evaluation of afib episode, mitral valve. )    Specialty:  Cardiology   Contact information:   [address] [place] [address] [phone]        The results of significant diagnostics from this hospitalization (including imaging, microbiology, ancillary and laboratory) are listed below for reference.    Significant Diagnostic Studies: Dg Chest 2 View  08/14/2013   CLINICAL DATA:  Mitral valve prolapse. Nausea and vomiting with weakness.  EXAM: CHEST  2 VIEW  COMPARISON:  07/29/2013.  FINDINGS: Normal heart  size. Clear left lung. Patchy infiltrates involve the right upper and right middle lobe without effusion or pneumothorax. Stable mild hyperinflation. No osseous findings. Worsened aeration from priors.  IMPRESSION: Early right upper and right middle lobe infiltrates, new from priors.   Electronically Signed   By: Davonna Belling M.D.   On: 08/14/2013 17:02    Microbiology: Recent Results (from the past 240 hour(s))  CULTURE, BLOOD (ROUTINE X 2)     Status: None   Collection Time    08/14/13  5:30 PM      Result Value Range Status   Specimen Description BLOOD LEFT ARM   Final   Special Requests BOTTLES DRAWN AEROBIC AND ANAEROBIC 6CC   Final   Culture NO GROWTH 2 DAYS   Final   Report Status PENDING   Incomplete  CULTURE, BLOOD (ROUTINE X 2)     Status: None   Collection Time    08/14/13  6:00 PM      Result Value Range Status   Specimen Description BLOOD LEFT HAND   Final   Special Requests BOTTLES DRAWN AEROBIC AND ANAEROBIC 6CC   Final   Culture NO GROWTH 2 DAYS   Final   Report Status PENDING   Incomplete     Labs: Basic Metabolic Panel:  Recent Labs Lab 08/14/13 1716 08/15/13 0442 08/16/13 0442  NA 134* 143 139  K 3.8 4.8 3.1*  CL 94* 106 103  CO2 28 29 27   GLUCOSE 150* 135* 107*  BUN 12 12 10   CREATININE 0.73 0.77 0.71  CALCIUM 9.3 8.5 8.5   Liver Function Tests:  Recent Labs Lab 08/14/13 1716  08/15/13 0442  AST 20 17  ALT 17 14  ALKPHOS 73 68  BILITOT 1.2 1.2  PROT 6.8 5.9*  ALBUMIN 3.3* 2.7*   No results found for this basename: LIPASE, AMYLASE,  in the last 168 hours No results found for this basename: AMMONIA,  in the last 168 hours CBC:  Recent Labs Lab 08/14/13 1716 08/15/13 0442 08/16/13 0442  WBC 17.5* 19.6* 12.4*  NEUTROABS 15.9*  --   --   HGB 13.1 11.6* 10.3*  HCT 39.5 35.6* 32.4*  MCV 90.8 92.2 92.3  PLT 229 228 202   Cardiac Enzymes: No results found for this basename: CKTOTAL, CKMB, CKMBINDEX, TROPONINI,  in the last 168 hours BNP: BNP (last 3 results) No results found for this basename: PROBNP,  in the last 8760 hours CBG: No results found for this basename: GLUCAP,  in the last 168 hours     Signed:  Gwenyth Bender  [facility] 08/16/2013, 2:12 PM  Attending note:  Patient seen and examined.  Agree with note as above.  She has been admitted with pneumonia. She's been started on antibiotics and is now afebrile. Her leukocytosis is trending down. Clinically she is improving on room air. She is breathing comfortably. She also has a history of COPD. She was placed on a prednisone taper and appears to be tolerating this very well. Her beta blocker was held on admission due to low blood pressure. She did develop an episode of paroxysmal rapid atrial fibrillation. This resolved once her beta blocker was resumed. She's been asked to followup with cardiology in the outpatient setting to see if repeat echo is needed/any further workup. The patient is ambulating without difficulty on room air. She is ready for discharge home.  [name]

## 2013-08-16 NOTE — Progress Notes (Signed)
Patient ambulated without 02 and saturation was 90%.

## 2013-08-19 LAB — CULTURE, BLOOD (ROUTINE X 2)

## 2013-08-20 NOTE — Progress Notes (Signed)
Patient received discharge instructions along with follow up appointments. Patient verbalized understanding of all instructions. Patient was escorteded by staff via wheelchair to vehicle. Patient discharged to home in stable conditions.

## 2013-08-22 ENCOUNTER — Other Ambulatory Visit (HOSPITAL_COMMUNITY): Payer: Self-pay | Admitting: Physician Assistant

## 2013-08-22 ENCOUNTER — Ambulatory Visit (HOSPITAL_COMMUNITY)
Admission: RE | Admit: 2013-08-22 | Discharge: 2013-08-22 | Disposition: A | Discharge status: Home / Self Care | Payer: BC Managed Care – PPO | Source: Ambulatory Visit | Attending: Physician Assistant | Admitting: Physician Assistant

## 2013-08-22 ENCOUNTER — Other Ambulatory Visit (HOSPITAL_COMMUNITY): Payer: Self-pay | Admitting: *Deleted

## 2013-08-22 DIAGNOSIS — R222 Localized swelling, mass and lump, trunk: Secondary | ICD-10-CM

## 2013-08-22 DIAGNOSIS — J189 Pneumonia, unspecified organism: Secondary | ICD-10-CM

## 2013-08-24 ENCOUNTER — Ambulatory Visit (HOSPITAL_COMMUNITY)
Admission: RE | Admit: 2013-08-24 | Discharge: 2013-08-24 | Disposition: A | Discharge status: Home / Self Care | Payer: BC Managed Care – PPO | Source: Ambulatory Visit | Attending: *Deleted | Admitting: *Deleted

## 2013-08-24 DIAGNOSIS — J449 Chronic obstructive pulmonary disease, unspecified: Secondary | ICD-10-CM | POA: Insufficient documentation

## 2013-08-24 DIAGNOSIS — R918 Other nonspecific abnormal finding of lung field: Secondary | ICD-10-CM | POA: Insufficient documentation

## 2013-08-24 DIAGNOSIS — R059 Cough, unspecified: Secondary | ICD-10-CM | POA: Insufficient documentation

## 2013-08-24 DIAGNOSIS — J4489 Other specified chronic obstructive pulmonary disease: Secondary | ICD-10-CM | POA: Insufficient documentation

## 2013-08-24 DIAGNOSIS — R05 Cough: Secondary | ICD-10-CM | POA: Insufficient documentation

## 2013-08-24 DIAGNOSIS — R222 Localized swelling, mass and lump, trunk: Secondary | ICD-10-CM

## 2013-08-24 MED ORDER — IOHEXOL 300 MG/ML  SOLN
80.0000 mL | Freq: Once | INTRAMUSCULAR | Status: AC | PRN
  Administered 2013-08-24: 80 mL via INTRAVENOUS

## 2013-08-30 ENCOUNTER — Encounter: Payer: Self-pay | Admitting: Cardiovascular Disease

## 2013-08-30 ENCOUNTER — Ambulatory Visit (INDEPENDENT_AMBULATORY_CARE_PROVIDER_SITE_OTHER): Payer: BC Managed Care – PPO | Admitting: Cardiovascular Disease

## 2013-08-30 VITALS — BP 134/89 | HR 49 | Ht 66.0 in | Wt 213.0 lb

## 2013-08-30 DIAGNOSIS — I059 Rheumatic mitral valve disease, unspecified: Secondary | ICD-10-CM | POA: Insufficient documentation

## 2013-08-30 DIAGNOSIS — I4891 Unspecified atrial fibrillation: Secondary | ICD-10-CM | POA: Insufficient documentation

## 2013-08-30 DIAGNOSIS — I340 Nonrheumatic mitral (valve) insufficiency: Secondary | ICD-10-CM | POA: Insufficient documentation

## 2013-08-30 NOTE — Patient Instructions (Addendum)
Your physician recommends that you schedule a follow-up appointment in: ONE MONTH  Your physician has requested that you have an echocardiogram. Echocardiography is a painless test that uses sound waves to create images of your heart. It provides your doctor with information about the size and shape of your heart and how well your heart's chambers and valves are working. This procedure takes approximately one hour. There are no restrictions for this procedure.  Your physician has recommended that you wear an event monitor. Event monitors are medical devices that record the heart's electrical activity. Doctors most often [place] these monitors to diagnose arrhythmias. Arrhythmias are problems with the speed or rhythm of the heartbeat. The monitor is a small, portable device. You can wear one while you do your normal daily activities. This is usually used to diagnose what is causing palpitations/syncope (passing out).  WE WILL CALL YOU WITH YOUR TEST RESULTS/INSTRUCTIONS/NEXT STEPS ONCE RECEIVED BY THE PROVIDER  PLEASE BE ADVISED YOU WILL STILL NEED TO KEEP YOUR FOLLOW UP APPOINTMENT TO DISCUSS FURTHER DETAILS OF YOUR TEST RESULTS/NEXT STEPS/FUTURE PLAN OF CARE WITH YOUR PROVIDER DESPITE THE FACT THAT YOU MAY HAVE NORMAL TEST RESULTS

## 2013-08-30 NOTE — Progress Notes (Signed)
Patient ID: [name], female   DOB: 03/19/1951, 62 y.o.   MRN: [id number]       CARDIOLOGY CONSULT NOTE  Patient ID: [name] MRN: [id number] DOB/AGE: 09-25-1951 62 y.o.  Admit date: (Not on file) Primary Physician Samuel Jester, DO  Reason for Consultation: mitral valve disease, atrial fibrillation  HPI: Mrs. Yebra is a 62 yr old woman with COPD, mitral valve prolapse, and hypercholesterolemia who was recently hospitalized for community acquired pneumonia. During this hospitalization, she was noted to have a paroxysm of atrial fibrillation which resolved once her home dose of metoprolol was initiated. An ECG from 10/13 revealed atrial fibrillation at 112 bpm. A chest CT revealed scattered groundglass opacities suggestive of an atypical pneumonia.  She said she experienced palpitations for about 2 hours when she had this episode, but after receiving metoprolol, her symptoms resolved. Prior to this hospitalization, she denies any palpitations, chest pain, lightheadedness, syncope, orthopnea, PND, and leg swelling.  She checks her HR at home and says it averages 60-70 bpm. If she feels very tired or uses a lot of albuterol for her COPD, she notices her HR goes to 125 bpm. With this, she denies experiencing palpitations and says her SOB is no different than her baseline.  She says she was diagnosed with mitral valve prolapse in 03-30-84 and hasn't had an echocardiogram since that time.  SocHx: quit smoking in 1990/03/30.  FamHx: mother died of MI at age 69, brother has atrial fibrillation. Father died of emphysema complications.  Allergies  Allergen Reactions  . Penicillins Rash    Current Outpatient Prescriptions  Medication Sig Dispense Refill  . albuterol (PROAIR HFA) 108 (90 BASE) MCG/ACT inhaler Inhale 2 puffs into the lungs every 6 (six) hours as needed for wheezing or shortness of breath.      Marland Kitchen aspirin 325 MG tablet Take 325 mg by mouth daily.      . Cholecalciferol  (VITAMIN D3) 5000 UNITS CAPS Take 1 capsule by mouth daily.      Marland Kitchen CRANBERRY PO Take 1 capsule by mouth daily.      . Fluticasone-Salmeterol (ADVAIR) 250-50 MCG/DOSE AEPB Inhale 1 puff into the lungs every 12 (twelve) hours.      Marland Kitchen guaiFENesin (MUCINEX) 600 MG 12 hr tablet Take 1 tablet (600 mg total) by mouth 2 (two) times daily.      . meloxicam (MOBIC) 7.5 MG tablet Take 7.5 mg by mouth daily.      . metoprolol (LOPRESSOR) 50 MG tablet Take 25-50 mg by mouth 2 (two) times daily. Patient takes 1 tablet in the morning and 1/2 tablet at night      . montelukast (SINGULAIR) 10 MG tablet Take 10 mg by mouth at bedtime.      . niacin (NIASPAN) 500 MG CR tablet Take 500 mg by mouth at bedtime.      . Omega-3 Krill Oil 300 MG CAPS Take 1 capsule by mouth daily.      . ranitidine (ZANTAC) 150 MG tablet Take 150 mg by mouth daily.      Marland Kitchen tiotropium (SPIRIVA) 18 MCG inhalation capsule Place 18 mcg into inhaler and inhale daily.       No current facility-administered medications for this visit.    Past Medical History  Diagnosis Date  . COPD (chronic obstructive pulmonary disease)   . Hypercholesteremia   . Mitral valve prolapse   . Paroxysmal atrial fibrillation     Past Surgical History  Procedure Laterality  Date  . Abdominal hysterectomy    . Cesarean section      History   Social History  . Marital Status: Married    Spouse Name: N/A    Number of Children: N/A  . Years of Education: N/A   Occupational History  . Not on file.   Social History Main Topics  . Smoking status: Former Smoker    Quit date: 08/14/1990  . Smokeless tobacco: Not on file  . Alcohol Use: No  . Drug Use: No  . Sexual Activity: Not on file   Other Topics Concern  . Not on file   Social History Narrative  . No narrative on file     Family History  Problem Relation Age of Onset  . Heart attack Mother   . Emphysema Father      Prior to Admission medications   Medication Sig Start Date End  Date Taking? Authorizing Provider  albuterol (PROAIR HFA) 108 (90 BASE) MCG/ACT inhaler Inhale 2 puffs into the lungs every 6 (six) hours as needed for wheezing or shortness of breath.   Yes Historical Provider, MD  aspirin 325 MG tablet Take 325 mg by mouth daily.   Yes Historical Provider, MD  Cholecalciferol (VITAMIN D3) 5000 UNITS CAPS Take 1 capsule by mouth daily.   Yes Historical Provider, MD  CRANBERRY PO Take 1 capsule by mouth daily.   Yes Historical Provider, MD  Fluticasone-Salmeterol (ADVAIR) 250-50 MCG/DOSE AEPB Inhale 1 puff into the lungs every 12 (twelve) hours.   Yes Historical Provider, MD  guaiFENesin (MUCINEX) 600 MG 12 hr tablet Take 1 tablet (600 mg total) by mouth 2 (two) times daily. 08/16/13  Yes Lesle Chris Black, NP  meloxicam (MOBIC) 7.5 MG tablet Take 7.5 mg by mouth daily.   Yes Historical Provider, MD  metoprolol (LOPRESSOR) 50 MG tablet Take 25-50 mg by mouth 2 (two) times daily. Patient takes 1 tablet in the morning and 1/2 tablet at night   Yes Historical Provider, MD  montelukast (SINGULAIR) 10 MG tablet Take 10 mg by mouth at bedtime.   Yes Historical Provider, MD  niacin (NIASPAN) 500 MG CR tablet Take 500 mg by mouth at bedtime.   Yes Historical Provider, MD  Omega-3 Krill Oil 300 MG CAPS Take 1 capsule by mouth daily.   Yes Historical Provider, MD  ranitidine (ZANTAC) 150 MG tablet Take 150 mg by mouth daily.   Yes Historical Provider, MD  tiotropium (SPIRIVA) 18 MCG inhalation capsule Place 18 mcg into inhaler and inhale daily.   Yes Historical Provider, MD     Review of systems complete and found to be negative unless listed above in HPI     Physical exam Blood pressure 134/89, pulse 49, height 5\' 6"  (1.676 m), weight 213 lb (96.616 kg). General: NAD Neck: No JVD, no thyromegaly or thyroid nodule.  Lungs: Clear to auscultation bilaterally with normal respiratory effort. CV: Nondisplaced PMI.  Heart irregular rhythm, normal rate, normal S1/S2, no S3/S4,  no murmur.  No peripheral edema.  No carotid bruit.  Normal pedal pulses.  Abdomen: Soft, nontender, no hepatosplenomegaly, no distention.  Skin: Intact without lesions or rashes.  Neurologic: Alert and oriented x 3.  Psych: Normal affect. Extremities: No clubbing or cyanosis.  HEENT: Normal.   Labs:   Lab Results  Component Value Date   WBC 12.4* 08/16/2013   HGB 10.3* 08/16/2013   HCT 32.4* 08/16/2013   MCV 92.3 08/16/2013   PLT 202 08/16/2013  No results found for this basename: NA, K, CL, CO2, BUN, CREATININE, CALCIUM, LABALBU, PROT, BILITOT, ALKPHOS, ALT, AST, GLUCOSE,  in the last 168 hours No results found for this basename: CKTOTAL, CKMB, CKMBINDEX, TROPONINI    No results found for this basename: CHOL   No results found for this basename: HDL   No results found for this basename: LDLCALC   No results found for this basename: TRIG   No results found for this basename: CHOLHDL   No results found for this basename: LDLDIRECT       EKG: 10/28 (atrial fibrillation, 104 bpm, TWI in I, aVL, q waves in I and aVL (consider old high lateral infarct)  Studies: see HPI   ASSESSMENT AND PLAN: 1. Atrial fibrillation: the patient denies any symptoms with respect to this outside of her hospitalization for pneumonia. With respect to her risk factors for a stroke, her CHADS-Vasc score is 1/9 (female gender), thus putting her at an exceedingly low risk. For this reason, I do not recommend anticoagulation. I will obtain an echocardiogram to assess her left atrial volume and systolic function. I will also order an event monitor (2 week) to assess her burden of atrial fibrillation. She may have persistent atrial fibrillation with regards to her classification. Her HR has been controlled thus far as per home monitoring, thus I will not change her dose of metoprolol. She takes ASA 325 mg daily to alleviate symptoms from niacin. 2. Mitral valve prolapse: the method of diagnosing MVP in 1985  is different when compared to current techniques, as echocardiography technology has progressed significantly, and many patients who were previously thought to have this actually do not. I will obtain an echocardiogram to assess her mitral valve. 3. Hyperlipidemia: takes niacin with ASA.  Dispo: f/u 4-6 weeks.   Signed: Prentice Docker, M.D., F.A.C.C.  08/30/2013, 8:48 AM

## 2013-09-02 ENCOUNTER — Ambulatory Visit (HOSPITAL_COMMUNITY)
Admission: RE | Admit: 2013-09-02 | Discharge: 2013-09-02 | Disposition: A | Discharge status: Home / Self Care | Payer: BC Managed Care – PPO | Source: Ambulatory Visit | Attending: *Deleted | Admitting: *Deleted

## 2013-09-02 DIAGNOSIS — J4489 Other specified chronic obstructive pulmonary disease: Secondary | ICD-10-CM | POA: Insufficient documentation

## 2013-09-02 DIAGNOSIS — I059 Rheumatic mitral valve disease, unspecified: Secondary | ICD-10-CM | POA: Insufficient documentation

## 2013-09-02 DIAGNOSIS — Z87891 Personal history of nicotine dependence: Secondary | ICD-10-CM | POA: Insufficient documentation

## 2013-09-02 DIAGNOSIS — I4891 Unspecified atrial fibrillation: Secondary | ICD-10-CM

## 2013-09-02 DIAGNOSIS — J449 Chronic obstructive pulmonary disease, unspecified: Secondary | ICD-10-CM | POA: Insufficient documentation

## 2013-09-02 DIAGNOSIS — Z6834 Body mass index (BMI) 34.0-34.9, adult: Secondary | ICD-10-CM | POA: Insufficient documentation

## 2013-09-02 DIAGNOSIS — E669 Obesity, unspecified: Secondary | ICD-10-CM | POA: Insufficient documentation

## 2013-09-02 NOTE — Progress Notes (Signed)
*  PRELIMINARY RESULTS* Echocardiogram 2D Echocardiogram has been performed.  [name] 09/02/2013, 11:54 AM

## 2013-09-05 ENCOUNTER — Ambulatory Visit (INDEPENDENT_AMBULATORY_CARE_PROVIDER_SITE_OTHER): Payer: BC Managed Care – PPO | Admitting: Internal Medicine

## 2013-09-05 ENCOUNTER — Encounter: Payer: Self-pay | Admitting: Internal Medicine

## 2013-09-05 VITALS — BP 108/74 | HR 67 | Temp 98.1°F | Ht 65.25 in | Wt 215.0 lb

## 2013-09-05 DIAGNOSIS — R918 Other nonspecific abnormal finding of lung field: Secondary | ICD-10-CM

## 2013-09-05 DIAGNOSIS — J449 Chronic obstructive pulmonary disease, unspecified: Secondary | ICD-10-CM

## 2013-09-05 NOTE — Patient Instructions (Addendum)
Try stopping the pm dose of advair for two weeks then stop the am dose also and see if it makes any difference  Continue spiriva one capsule daily   Only use your albuterol(proair)  as a rescue medication to be used if you can't catch your breath by resting or doing a relaxed purse lip breathing pattern.  - The less you use it, the better it will work when you need it. - Ok to use up to every 4 hours if you must but call for immediate appointment if use goes up over your usual need - Don't leave home without it !!  (think of it like your spare tire for your car)   Please schedule a follow up office visit in 4 weeks, sooner if needed with cxr and pft's

## 2013-09-05 NOTE — Progress Notes (Signed)
  Subjective:    Patient ID: [name], female    DOB: 1951/03/08    MRN: [id number]  HPI  18 yowf quit smoking in 1991 and dx as copd 1993 here in pulmonary clinic referred back to [facility] clinic 09/05/2013  By Dr Valentina Gu PA Prudy Feeler  for eval of abn CXR   09/05/2013 1st [facility] office visit/ [name] cc acute onset shaking chills, cough/ gag 08/14/13 admitted to [facility] and rx as CAP  but now  feeling 100% baseline doe on  spiriva/ advair/ singulair no 02 and some am cough > most the time not productive.  No obvious day to day or daytime variabilty or   cp or chest tightness, subjective wheeze overt sinus or hb symptoms. No unusual exp hx or h/o childhood pna/ asthma or knowledge of premature birth.  Sleeping ok without nocturnal  or early am exacerbation  of respiratory  c/o's or need for noct saba. Also denies any obvious fluctuation of symptoms with weather or environmental changes or other aggravating or alleviating factors except as outlined above   Current Medications, Allergies, Complete Past Medical History, Past Surgical History, Family History, and Social History were reviewed in [employer] record.             Review of Systems  Constitutional: Negative for fever, chills and unexpected weight change.  HENT: Negative for congestion, dental problem, ear pain, nosebleeds, postnasal drip, rhinorrhea, sinus pressure, sneezing, sore throat, trouble swallowing and voice change.   Eyes: Negative for visual disturbance.  Respiratory: Negative for cough, choking and shortness of breath.   Cardiovascular: Negative for chest pain and leg swelling.  Gastrointestinal: Negative for vomiting, abdominal pain and diarrhea.  Genitourinary: Negative for difficulty urinating.  Musculoskeletal: Negative for arthralgias.  Skin: Negative for rash.  Neurological: Negative for tremors, syncope and headaches.  Hematological: Does not bruise/bleed easily.        Objective:   Physical Exam  Wt Readings from Last 3 Encounters:  09/05/13 215 lb (97.523 kg)  08/30/13 213 lb (96.616 kg)  08/14/13 215 lb 6.2 oz (97.7 kg)      Top dentures/ edentulous   HEENT mild turbinate edema.  Oropharynx no thrush or excess pnd or cobblestoning.  No JVD or cervical adenopathy. Mild accessory muscle hypertrophy. Trachea midline, nl thryroid. Chest was hyperinflated by percussion with diminished breath sounds and moderate increased exp time without wheeze. Hoover sign positive at mid inspiration. Regular rate and rhythm without murmur gallop or rub or increase P2 or edema.  Abd: no hsm, nl excursion. Ext warm without cyanosis or clubbing.      CT chest 08/24/13 1. Scattered ground-glass and indistinct pulmonary nodules in both  lungs. Indistinct margins and the fact that these are new compared  to the prior chest radiograph from 07/29/2013 indicates a high  likelihood of inflammatory etiology, likely from atypical pneumonia.  There is associated airway thickening.      Assessment & Plan:

## 2013-09-07 DIAGNOSIS — R918 Other nonspecific abnormal finding of lung field: Secondary | ICD-10-CM | POA: Insufficient documentation

## 2013-09-07 NOTE — Assessment & Plan Note (Addendum)
See CT 08/22/13 1. Scattered ground-glass and indistinct pulmonary nodules in both  lungs. Indistinct margins and the fact that these are new compared  to the prior chest radiograph from 07/29/2013 indicates a high  likelihood of inflammatory etiology, likely from atypical pneumonia.  There is associated airway thickening. Followup chest radiography in  4 weeks time following antimicrobial therapy is recommended to  ensure resolution and exclude the unlikely possibility of underlying  Malignancy.  Note the acute change on cxr that lead to the ct occurred in the setting of  A febrile illness and Although there are clearly abnormalities on CT scan, most of the nodules  should probably be considered "microscopic" since not obvious on plain cxr and are probably all benign but difficult to prove this now.    In the setting of obvious "macroscopic" health issues,  I am usually  reluctatnt to embark on an invasive w/u at this point anyway but will arrange consevative  follow up and in the meantime see what we can do to address the patient's subjective concerns.  All we really need to do in the setting of multiple nodules is repeat a f/u cxr to see if the one "macroscopic change" clears which would suggest this was all inflammatory  Discussed in detail all the  indications, usual  risks and alternatives  relative to the benefits with patient who agrees to proceed with conservative f/u with cxr at next ov and go from there

## 2013-09-07 NOTE — Assessment & Plan Note (Signed)
DDX of  difficult airways managment all start with A and  include Adherence, Ace Inhibitors, Acid Reflux, Active Sinus Disease, Alpha 1 Antitripsin deficiency, Anxiety masquerading as Airways dz,  ABPA,  allergy(esp in young), Aspiration (esp in elderly), Adverse effects of DPI,  Active smokers, plus two Bs  = Bronchiectasis and Beta blocker use..and one C= CHF  Adherence is always the initial "prime suspect" and is a multilayered concern that requires a "trust but verify" approach in every patient - starting with knowing how to use medications, especially inhalers, correctly, keeping up with refills and understanding the fundamental difference between maintenance and prns vs those medications only taken for a very short course and then stopped and not refilled. The proper method of use, as well as anticipated side effects, of a metered-dose inhaler are discussed and demonstrated to the patient. Improved effectiveness after extensive coaching during this visit to a level of approximately  90% with spiriva but only 75% with advair and 50% with hfa   To simplify rx suggested she try just the spriva and prn saba and regroup in 4-6 weeks with pfts

## 2013-09-15 ENCOUNTER — Other Ambulatory Visit: Payer: Self-pay | Admitting: *Deleted

## 2013-09-15 DIAGNOSIS — I059 Rheumatic mitral valve disease, unspecified: Secondary | ICD-10-CM

## 2013-09-15 DIAGNOSIS — I4891 Unspecified atrial fibrillation: Secondary | ICD-10-CM

## 2013-09-19 MED ORDER — METOPROLOL TARTRATE 50 MG PO TABS: 50.0000 mg | ORAL_TABLET | Freq: Two times a day (BID) | ORAL | Status: DC

## 2013-10-03 ENCOUNTER — Ambulatory Visit (INDEPENDENT_AMBULATORY_CARE_PROVIDER_SITE_OTHER): Payer: BC Managed Care – PPO | Admitting: Internal Medicine

## 2013-10-03 ENCOUNTER — Encounter: Payer: Self-pay | Admitting: Internal Medicine

## 2013-10-03 ENCOUNTER — Ambulatory Visit (INDEPENDENT_AMBULATORY_CARE_PROVIDER_SITE_OTHER)
Admission: RE | Admit: 2013-10-03 | Discharge: 2013-10-03 | Disposition: A | Discharge status: Home / Self Care | Payer: BC Managed Care – PPO | Source: Ambulatory Visit | Attending: Internal Medicine | Admitting: Internal Medicine

## 2013-10-03 VITALS — BP 132/80 | HR 60 | Temp 97.4°F | Ht 66.5 in | Wt 213.0 lb

## 2013-10-03 DIAGNOSIS — J449 Chronic obstructive pulmonary disease, unspecified: Secondary | ICD-10-CM

## 2013-10-03 DIAGNOSIS — R918 Other nonspecific abnormal finding of lung field: Secondary | ICD-10-CM

## 2013-10-03 LAB — PULMONARY FUNCTION TEST

## 2013-10-03 MED ORDER — MOMETASONE FURO-FORMOTEROL FUM 200-5 MCG/ACT IN AERO: INHALATION_SPRAY | RESPIRATORY_TRACT | Status: DC

## 2013-10-03 NOTE — Patient Instructions (Addendum)
Advair 250 /50 one twice daily or dulera 200 Take 2 puffs first thing in am and then another 2 puffs about 12 hours later.   Only use your albuterol as a rescue medication to be used if you can't catch your breath by resting or doing a relaxed purse lip breathing pattern.  - The less you use it, the better it will work when you need it. - Ok to use up to every 4 hours if you must but call for immediate appointment if use goes up over your usual need - Don't leave home without it !!  (think of it like your spare tire for your car)   Work on inhaler technique:  relax and gently blow all the way out then take a nice smooth deep breath back in, triggering the inhaler at same time you start breathing in.  Hold for up to 5 seconds if you can.  Rinse and gargle with water when done   If you are satisfied with your treatment plan let your doctor know and he/she can either refill your medications or you can return here when your prescription runs out.     If in any way you are not 100% satisfied,  please tell us.  If 100% better, tell your friends!

## 2013-10-03 NOTE — Progress Notes (Signed)
PFT done today. 

## 2013-10-03 NOTE — Progress Notes (Signed)
Subjective:    Patient ID: [name], female    DOB: [birth year]    MRN: [id number]   Brief patient profile:  62 yowf quit smoking in 1991 and dx as copd 1993 here in pulmonary clinic referred back to [facility] clinic 09/05/2013  By Dr Valentina Gu PA Prudy Feeler  for eval of abn CXR  History of Present Illness  09/05/2013 1st [facility] office visit/ Wert cc acute onset shaking chills, cough/ gag 08/14/13 admitted to [facility] and rx as CAP  but now  feeling 100% baseline doe on  spiriva/ advair/ singulair no 02 and some am cough > most the time not productive. rec Try stopping the pm dose of advair for two weeks then stop the am dose also and see if it makes any difference Continue spiriva one capsule daily  Only use your albuterol(proair)      10/03/2013 f/u ov/Wert re: GOLD III COPD Chief Complaint  Patient presents with  . Followup with PFT and CXR    Pt states that her breathing is doing well. She has used rescue inhaler x 2 in the past wk. No new co's today.    Worse off advair so restarted - has tried symbicort in past didn't work as well but hfa quite poor (see a/p)   No obvious day to day or daytime variabilty or assoc chronic cough or cp or chest tightness, subjective wheeze overt sinus or hb symptoms. No unusual exp hx or h/o childhood pna/ asthma or knowledge of premature birth.  Sleeping ok without nocturnal  or early am exacerbation  of respiratory  c/o's or need for noct saba. Also denies any obvious fluctuation of symptoms with weather or environmental changes or other aggravating or alleviating factors except as outlined above   Current Medications, Allergies, Complete Past Medical History, Past Surgical History, Family History, and Social History were reviewed in [employer] record.  ROS  The following are not active complaints unless bolded sore throat, dysphagia, dental problems, itching, sneezing,  nasal congestion or excess/ purulent  secretions, ear ache,   fever, chills, sweats, unintended wt loss, pleuritic or exertional cp, hemoptysis,  orthopnea pnd or leg swelling, presyncope, palpitations, heartburn, abdominal pain, anorexia, nausea, vomiting, diarrhea  or change in bowel or urinary habits, change in stools or urine, dysuria,hematuria,  rash, arthralgias, visual complaints, headache, numbness weakness or ataxia or problems with walking or coordination,  change in mood/affect or memory.                       Objective:   Physical Exam  Wt Readings from Last 3 Encounters:  10/03/13 213 lb (96.616 kg)  09/05/13 215 lb (97.523 kg)  08/30/13 213 lb (96.616 kg)         Top dentures/ edentulous   HEENT mild turbinate edema.  Oropharynx no thrush or excess pnd or cobblestoning.  No JVD or cervical adenopathy. Mild accessory muscle hypertrophy. Trachea midline, nl thryroid. Chest was hyperinflated by percussion with diminished breath sounds and moderate increased exp time without wheeze. Hoover sign positive at mid inspiration. Regular rate and rhythm without murmur gallop or rub or increase P2 or edema.  Abd: no hsm, nl excursion. Ext warm without cyanosis or clubbing.      CT chest 08/24/13 1. Scattered ground-glass and indistinct pulmonary nodules in both  lungs. Indistinct margins and the fact that these are new compared  to the prior chest radiograph from 07/29/2013 indicates  a high  likelihood of inflammatory etiology, likely from atypical pneumonia.  There is associated airway thickening.    CXR  10/03/2013 : No active cardiopulmonary disease.     Assessment & Plan:

## 2013-10-03 NOTE — Progress Notes (Signed)
Quick Note:  Spoke with pt and notified of results per Dr. Wert. Pt verbalized understanding and denied any questions.  ______ 

## 2013-10-04 NOTE — Assessment & Plan Note (Signed)
1. Scattered ground-glass and indistinct pulmonary nodules in both  lungs. Indistinct margins and the fact that these are new compared  to the prior chest radiograph from 07/29/2013 indicates a high  likelihood of inflammatory etiology, likely from atypical pneumonia.  There is associated airway thickening. Followup chest radiography in  4 weeks time following antimicrobial therapy is recommended to  ensure resolution and exclude the unlikely possibility of underlying  malignancy.   cxr today is nl so f/u cxr in 6 months all that's needed here

## 2013-10-04 NOTE — Assessment & Plan Note (Signed)
-   PFT's  FEV1  1.07 (40%) ratio 46 and 13% better p B2 and DLCO 61%    Adequate rx though on LABA/LAMA/ICS and issues with insurance/ costs > try dulera 200 2bid samples (on her preferred list and she hasn't tried it yet but essentially the same as symbicort  The proper method of use, as well as anticipated side effects, of a metered-dose inhaler are discussed and demonstrated to the patient. Improved effectiveness after extensive coaching during this visit to a level of approximately  75% from baseline of < 25%  Also reviewed dpi   See instructions for specific recommendations which were reviewed directly with the patient who was given a copy with highlighter outlining the key components.

## 2013-10-13 ENCOUNTER — Ambulatory Visit (INDEPENDENT_AMBULATORY_CARE_PROVIDER_SITE_OTHER): Payer: BC Managed Care – PPO | Admitting: Cardiovascular Disease

## 2013-10-13 ENCOUNTER — Encounter: Payer: Self-pay | Admitting: Cardiovascular Disease

## 2013-10-13 VITALS — BP 119/61 | HR 70 | Ht 67.0 in | Wt 215.0 lb

## 2013-10-13 DIAGNOSIS — I4891 Unspecified atrial fibrillation: Secondary | ICD-10-CM

## 2013-10-13 DIAGNOSIS — E785 Hyperlipidemia, unspecified: Secondary | ICD-10-CM

## 2013-10-13 DIAGNOSIS — I059 Rheumatic mitral valve disease, unspecified: Secondary | ICD-10-CM

## 2013-10-13 NOTE — Progress Notes (Signed)
Patient ID: [name], female   DOB: 01-30-1951, 62 y.o.   MRN: [id number]      SUBJECTIVE: The patient is here to followup on the results of her cardiovascular testing, which include an echocardiogram and an event monitor. Her echo showed normal left ventricular systolic function with an ejection fraction of 55-60% and grade 1 diastolic dysfunction. There is no evidence of mitral valve prolapse nor regurgitation. Her event monitor showed atrial fibrillation with heart rates which ranged between 100-110 beats per minute. For this reason I increased her metoprolol to 50 mg twice daily since her last office visit. She has been feeling well and has only experienced palpitations once. She denies chest pain and dizziness.  She works at [employer].    Allergies  Allergen Reactions  . Penicillins Rash    Current Outpatient Prescriptions  Medication Sig Dispense Refill  . albuterol (PROAIR HFA) 108 (90 BASE) MCG/ACT inhaler Inhale 2 puffs into the lungs every 6 (six) hours as needed for wheezing or shortness of breath.      Marland Kitchen aspirin 325 MG tablet Take 325 mg by mouth daily.      . Cholecalciferol (VITAMIN D3) 5000 UNITS CAPS Take 1 capsule by mouth daily.      Marland Kitchen CRANBERRY PO Take 1 capsule by mouth daily.      . Fluticasone-Salmeterol (ADVAIR) 250-50 MCG/DOSE AEPB Inhale 1 puff into the lungs every 12 (twelve) hours.      Marland Kitchen guaiFENesin (MUCINEX) 600 MG 12 hr tablet Take 600 mg by mouth daily as needed.      . meloxicam (MOBIC) 7.5 MG tablet Take 7.5 mg by mouth daily.      . metoprolol (LOPRESSOR) 50 MG tablet Take 50 mg by mouth 2 (two) times daily.      . mometasone-formoterol (DULERA) 200-5 MCG/ACT AERO Take 2 puffs first thing in am and then another 2 puffs about 12 hours later.  1 Inhaler  11  . montelukast (SINGULAIR) 10 MG tablet Take 10 mg by mouth at bedtime.      . niacin (NIASPAN) 500 MG CR tablet Take 500 mg by mouth at bedtime.      . Omega-3 Krill Oil 300 MG CAPS  Take 1 capsule by mouth daily.      . ranitidine (ZANTAC) 150 MG tablet Take 150 mg by mouth daily.      Marland Kitchen tiotropium (SPIRIVA) 18 MCG inhalation capsule Place 18 mcg into inhaler and inhale daily.       No current facility-administered medications for this visit.    Past Medical History  Diagnosis Date  . COPD (chronic obstructive pulmonary disease)   . Hypercholesteremia   . Mitral valve prolapse   . Paroxysmal atrial fibrillation     Past Surgical History  Procedure Laterality Date  . Abdominal hysterectomy    . Cesarean section    . Tubal ligation      History   Social History  . Marital Status: Married    Spouse Name: N/A    Number of Children: 1  . Years of Education: N/A   Occupational History  . [employer]    Social History Main Topics  . Smoking status: Former Smoker -- 1.00 packs/day for 20 years    Types: Cigarettes    Quit date: 08/14/1990  . Smokeless tobacco: Not on file  . Alcohol Use: No  . Drug Use: No  . Sexual Activity: Not on file   Other Topics Concern  .  Not on file   Social History Narrative  . No narrative on file     Filed Vitals:   10/13/13 1045  Height: 5\' 7"  (1.702 m)  Weight: 215 lb (97.523 kg)    PHYSICAL EXAM General: NAD Neck: No JVD, no thyromegaly or thyroid nodule.  Lungs: Clear to auscultation bilaterally with normal respiratory effort. CV: Nondisplaced PMI.  Heart regular S1/S2, no S3/S4, no murmur.  No peripheral edema.  No carotid bruit.  Normal pedal pulses.  Abdomen: Soft, nontender, no hepatosplenomegaly, no distention.  Neurologic: Alert and oriented x 3.  Psych: Normal affect. Extremities: No clubbing or cyanosis.   ECG: reviewed and available in electronic records.      ASSESSMENT AND PLAN: 1. Persistent atrial fibrillation: she is symptomatically stable. With respect to her risk factors for a stroke, her CHADS-Vasc score is 1/9 (female gender), thus putting her at an exceedingly low risk. For this  reason, I do not recommend anticoagulation at present. Her event monitor showed atrial fibrillation with HR's of 100-110 bpm, for which reason I increased her metoprolol to 50 mg bid. She takes ASA 325 mg daily to alleviate symptoms from niacin. Next year when she turns 76, her CHADS-Vasc score will increase to 2/9, at which point anticoagulation would be indicated. We discussed this in detail, and she is agreeable to this. She will call me in February 2015 to initiate Eliquis. I recommended she not take ASA along with this to reduce her risk for bleeding. She may decide to cut back to 81 mg daily at that time, in order to alleviate the side effects of niacin. 2. Mitral valve prolapse: her echocardiogram demonstrated no evidence of MVP nor mitral regurgitation. 3. Hyperlipidemia: takes niacin with ASA.  Dispo: f/u 6 months.  Prentice Docker, M.D., F.A.C.C.

## 2013-10-13 NOTE — Patient Instructions (Addendum)
Your physician recommends that you schedule a follow-up appointment in: 6 months  Your physician recommends that you continue on your current medications as directed. Please refer to the Current Medication list given to you today.  

## 2013-10-17 ENCOUNTER — Encounter: Payer: Self-pay | Admitting: Internal Medicine

## 2014-02-02 ENCOUNTER — Telehealth: Payer: Self-pay | Admitting: Cardiovascular Disease

## 2014-02-02 NOTE — Telephone Encounter (Signed)
I reached patient,she asked pcp to place her on eliquis 2.5 mg qd and she is taking ASA 81 mg qd,states she will discuss dosing with you at [facility]

## 2014-02-02 NOTE — Telephone Encounter (Signed)
Pt has question about eliquise

## 2014-02-02 NOTE — Telephone Encounter (Signed)
[facility] staff states pt called to say her pcp put her on eliquis as she has turned 74.I attempted to reach pt but she does not have voice mail set up on her phone

## 2014-03-06 ENCOUNTER — Encounter: Payer: Self-pay | Admitting: Cardiovascular Disease

## 2014-03-06 ENCOUNTER — Ambulatory Visit (INDEPENDENT_AMBULATORY_CARE_PROVIDER_SITE_OTHER): Payer: Medicare Other | Admitting: Cardiovascular Disease

## 2014-03-06 VITALS — BP 112/70 | HR 75 | Ht 66.0 in | Wt 216.1 lb

## 2014-03-06 DIAGNOSIS — E785 Hyperlipidemia, unspecified: Secondary | ICD-10-CM

## 2014-03-06 DIAGNOSIS — Z7901 Long term (current) use of anticoagulants: Secondary | ICD-10-CM

## 2014-03-06 DIAGNOSIS — I4891 Unspecified atrial fibrillation: Secondary | ICD-10-CM | POA: Diagnosis not present

## 2014-03-06 DIAGNOSIS — Z5181 Encounter for therapeutic drug level monitoring: Secondary | ICD-10-CM

## 2014-03-06 MED ORDER — APIXABAN 2.5 MG PO TABS: 2.5000 mg | ORAL_TABLET | Freq: Two times a day (BID) | ORAL | Status: DC

## 2014-03-06 NOTE — Patient Instructions (Signed)
Your physician wants you to follow-up in: 6 months You will receive a reminder letter in the mail two months in advance. If you don't receive a letter, please call our office to schedule the follow-up appointment.    Your physician has recommended you make the following change in your medication:    START Eliquis 2.5 mg twice a day    Thank you for choosing [facility] !

## 2014-03-06 NOTE — Progress Notes (Signed)
Patient ID: [name], female   DOB: 1950-12-13, 63 y.o.   MRN: [id number]      SUBJECTIVE: The patient is here to followup for atrial fibrillation. She also has a history of hyperlipidemia and takes niacin and ASA to alleviate the side effects of niacin. She had been recently started on Eliquis 2.5 mg daily but stopped taking it 3 weeks ago. She denies chest pain, shortness of breath, and palpitations.    Allergies  Allergen Reactions  . Penicillins Rash    Current Outpatient Prescriptions  Medication Sig Dispense Refill  . albuterol (PROAIR HFA) 108 (90 BASE) MCG/ACT inhaler Inhale 2 puffs into the lungs every 6 (six) hours as needed for wheezing or shortness of breath.      Marland Kitchen aspirin 325 MG tablet Take 325 mg by mouth daily.      . Cholecalciferol (VITAMIN D3) 5000 UNITS CAPS Take 1 capsule by mouth daily.      Marland Kitchen CRANBERRY PO Take 1 capsule by mouth daily.      . Fluticasone-Salmeterol (ADVAIR) 250-50 MCG/DOSE AEPB Inhale 1 puff into the lungs every 12 (twelve) hours.      Marland Kitchen guaiFENesin (MUCINEX) 600 MG 12 hr tablet Take 600 mg by mouth daily as needed.      . meloxicam (MOBIC) 7.5 MG tablet Take 7.5 mg by mouth daily.      . metoprolol (LOPRESSOR) 50 MG tablet Take 50 mg by mouth 2 (two) times daily.      . mometasone-formoterol (DULERA) 200-5 MCG/ACT AERO Take 2 puffs first thing in am and then another 2 puffs about 12 hours later.  1 Inhaler  11  . montelukast (SINGULAIR) 10 MG tablet Take 10 mg by mouth at bedtime.      . niacin (NIASPAN) 500 MG CR tablet Take 1,000 mg by mouth at bedtime.       . Omega-3 Krill Oil 300 MG CAPS Take 1 capsule by mouth daily.      . ranitidine (ZANTAC) 150 MG tablet Take 150 mg by mouth daily.      Marland Kitchen tiotropium (SPIRIVA) 18 MCG inhalation capsule Place 18 mcg into inhaler and inhale daily.       No current facility-administered medications for this visit.    Past Medical History  Diagnosis Date  . COPD (chronic obstructive pulmonary  disease)   . Hypercholesteremia   . Mitral valve prolapse   . Paroxysmal atrial fibrillation     Past Surgical History  Procedure Laterality Date  . Abdominal hysterectomy    . Cesarean section    . Tubal ligation      History   Social History  . Marital Status: Married    Spouse Name: N/A    Number of Children: 1  . Years of Education: N/A   Occupational History  . [employer]    Social History Main Topics  . Smoking status: Former Smoker -- 1.00 packs/day for 20 years    Types: Cigarettes    Quit date: 08/14/1990  . Smokeless tobacco: Not on file  . Alcohol Use: No  . Drug Use: No  . Sexual Activity: Not on file   Other Topics Concern  . Not on file   Social History Narrative  . No narrative on file    BP 112/70  Pulse 75    PHYSICAL EXAM General: NAD Neck: No JVD, no thyromegaly. Lungs: Clear to auscultation bilaterally with normal respiratory effort. CV: Nondisplaced PMI.  Regular rate and  rhythm, normal S1/S2, no S3/S4, no murmur. No pretibial or periankle edema.  No carotid bruit.  Normal pedal pulses.  Abdomen: Soft, nontender, no hepatosplenomegaly, no distention.  Neurologic: Alert and oriented x 3.  Psych: Normal affect. Extremities: No clubbing or cyanosis.   ECG: reviewed and available in electronic records.      ASSESSMENT AND PLAN: 1. Persistent atrial fibrillation: She is symptomatically stable and currently in a regular rhythm. With respect to her risk factors for a stroke, her CHADS-Vasc score is now 2/9 (age, female gender), thus putting her at an increased risk. For this reason, I recommend anticoagulation. Prior event monitoring showed atrial fibrillation with HR's of 100-110 bpm, for which reason I increased her metoprolol to 50 mg bid previously. She takes ASA 81 mg daily to alleviate symptoms from niacin. We discussed this in detail, and she is agreeable to commencing therapy with Eliquis, but requires ASA. While I would prefer to  treat her with Eliquis 5 mg bid, given her concomitant use of ASA, I will prescribe 2.5 mg bid. She is opposed to taking warfarin. 2. Hyperlipidemia: Takes niacin with ASA 81 mg daily.   Dispo: f/u 6 months.    Kate Sable, M.D., F.A.C.C.

## 2014-03-07 ENCOUNTER — Telehealth: Payer: Self-pay | Admitting: Cardiovascular Disease

## 2014-03-07 NOTE — Telephone Encounter (Signed)
Please see Prior Authorization form in refill bin / tgs  °

## 2014-03-08 ENCOUNTER — Telehealth: Payer: Self-pay | Admitting: *Deleted

## 2014-03-08 NOTE — Telephone Encounter (Signed)
Meloxicam is an NSAID, and all NSAID's have the potential to increase bleeding when used with anticoagulants. That being said, she is on a reduced dose of apixaban. I would not recommend the daily use of meloxican regardless, as it can lead to ulcer formation and kidney disease.

## 2014-03-08 NOTE — Telephone Encounter (Signed)
Made pt aware

## 2014-03-08 NOTE — Telephone Encounter (Signed)
Prior Auth Faxed to [facility].

## 2014-03-08 NOTE — Telephone Encounter (Signed)
Please advise 

## 2014-03-08 NOTE — Telephone Encounter (Signed)
Pt states that [facility] is requesting that patient make sure that she is able to take eliquis and meloxicam together

## 2014-04-05 DIAGNOSIS — Z1231 Encounter for screening mammogram for malignant neoplasm of breast: Secondary | ICD-10-CM | POA: Diagnosis not present

## 2014-08-15 DIAGNOSIS — Z23 Encounter for immunization: Secondary | ICD-10-CM | POA: Diagnosis not present

## 2014-10-10 ENCOUNTER — Encounter: Payer: Self-pay | Admitting: Cardiovascular Disease

## 2014-10-10 ENCOUNTER — Ambulatory Visit (INDEPENDENT_AMBULATORY_CARE_PROVIDER_SITE_OTHER): Payer: Medicare Other | Admitting: Cardiovascular Disease

## 2014-10-10 VITALS — BP 130/70 | HR 67 | Ht 66.0 in | Wt 217.8 lb

## 2014-10-10 DIAGNOSIS — I481 Persistent atrial fibrillation: Secondary | ICD-10-CM

## 2014-10-10 DIAGNOSIS — Z7901 Long term (current) use of anticoagulants: Secondary | ICD-10-CM | POA: Diagnosis not present

## 2014-10-10 DIAGNOSIS — I4819 Other persistent atrial fibrillation: Secondary | ICD-10-CM

## 2014-10-10 DIAGNOSIS — E785 Hyperlipidemia, unspecified: Secondary | ICD-10-CM | POA: Diagnosis not present

## 2014-10-10 DIAGNOSIS — Z5181 Encounter for therapeutic drug level monitoring: Secondary | ICD-10-CM

## 2014-10-10 DIAGNOSIS — I482 Chronic atrial fibrillation: Secondary | ICD-10-CM

## 2014-10-10 NOTE — Patient Instructions (Signed)
Your physician wants you to follow-up in: 6 months You will receive a reminder letter in the mail two months in advance. If you don't receive a letter, please call our office to schedule the follow-up appointment.     Your physician recommends that you continue on your current medications as directed. Please refer to the Current Medication list given to you today.      Thank you for choosing [facility] !        

## 2014-10-10 NOTE — Addendum Note (Signed)
Addended by: Barbarann Ehlers A on: 10/10/2014 09:26 AM   Modules accepted: Level of Service

## 2014-10-10 NOTE — Progress Notes (Signed)
Patient ID: [name], female   DOB: 03/03/1951, 63 y.o.   MRN: [id number]      SUBJECTIVE: The patient is here to followup for atrial fibrillation for which she takes Eliquis 2.5 mg bid. She also has a history of hyperlipidemia and takes niacin and ASA to alleviate the side effects of niacin. She seldom has palpitations. She denies chest pain and leg swelling, and has not had any bleeding problems. ECG today showed normal sinus rhythm with rSR' pattern, HR 63 bpm.   Review of Systems: As per "subjective", otherwise negative.  Allergies  Allergen Reactions  . Penicillins Rash    Current Outpatient Prescriptions  Medication Sig Dispense Refill  . albuterol (PROAIR HFA) 108 (90 BASE) MCG/ACT inhaler Inhale 2 puffs into the lungs every 6 (six) hours as needed for wheezing or shortness of breath.    Marland Kitchen apixaban (ELIQUIS) 2.5 MG TABS tablet Take 1 tablet (2.5 mg total) by mouth 2 (two) times daily. 90 tablet 3  . aspirin 81 MG tablet Take 81 mg by mouth daily.    . Cholecalciferol (VITAMIN D3) 5000 UNITS CAPS Take 1 capsule by mouth daily.    Marland Kitchen CRANBERRY PO Take 1 capsule by mouth daily.    Marland Kitchen guaiFENesin (MUCINEX) 600 MG 12 hr tablet Take 600 mg by mouth daily as needed.    . meloxicam (MOBIC) 7.5 MG tablet Take 7.5 mg by mouth daily.    . metoprolol (LOPRESSOR) 50 MG tablet Take 50 mg by mouth 2 (two) times daily.    . mometasone-formoterol (DULERA) 200-5 MCG/ACT AERO Take 2 puffs first thing in am and then another 2 puffs about 12 hours later. 1 Inhaler 11  . montelukast (SINGULAIR) 10 MG tablet Take 10 mg by mouth at bedtime.    . niacin (NIASPAN) 500 MG CR tablet Take 1,000 mg by mouth at bedtime.     . Omega-3 Krill Oil 300 MG CAPS Take 1 capsule by mouth daily.    . ranitidine (ZANTAC) 150 MG tablet Take 150 mg by mouth as needed.     . tiotropium (SPIRIVA) 18 MCG inhalation capsule Place 18 mcg into inhaler and inhale daily.     No current facility-administered medications for  this visit.    Past Medical History  Diagnosis Date  . COPD (chronic obstructive pulmonary disease)   . Hypercholesteremia   . Mitral valve prolapse   . Paroxysmal atrial fibrillation     Past Surgical History  Procedure Laterality Date  . Abdominal hysterectomy    . Cesarean section    . Tubal ligation      History   Social History  . Marital Status: Married    Spouse Name: N/A    Number of Children: 1  . Years of Education: N/A   Occupational History  . [employer]    Social History Main Topics  . Smoking status: Former Smoker -- 1.00 packs/day for 20 years    Types: Cigarettes    Quit date: 08/14/1990  . Smokeless tobacco: Not on file  . Alcohol Use: No  . Drug Use: No  . Sexual Activity: Not on file   Other Topics Concern  . Not on file   Social History Narrative     Filed Vitals:   10/10/14 0858  BP: 130/70  Pulse: 67  Height: 5\' 6"  (1.676 m)  Weight: 217 lb 12.8 oz (98.793 kg)    PHYSICAL EXAM General: NAD HEENT: Normal. Neck: No JVD, no thyromegaly.  Lungs: Faint, intermittent expiratory wheezes with normal respiratory effort. CV: Nondisplaced PMI.  Regular rate and rhythm, normal S1/S2, no S3/S4, no murmur. No pretibial or periankle edema.  No carotid bruit.  Normal pedal pulses.  Abdomen: Soft, nontender, no hepatosplenomegaly, no distention.  Neurologic: Alert and oriented x 3.  Psych: Normal affect. Skin: Normal. Musculoskeletal: Normal range of motion, no gross deformities. Extremities: No clubbing or cyanosis.   ECG: Most recent ECG reviewed.    ASSESSMENT AND PLAN: 1. Atrial fibrillation: Symptomatically stable, and in normal sinus rhythm. Continue low-dose Eliquis and metoprolol. 2. Hyperlipidemia: Continue niacin with ASA.  Dispo: f/u 6 months.  Kate Sable, M.D., F.A.C.C.

## 2015-06-25 ENCOUNTER — Encounter: Payer: Self-pay | Admitting: Cardiovascular Disease

## 2015-06-25 ENCOUNTER — Ambulatory Visit (INDEPENDENT_AMBULATORY_CARE_PROVIDER_SITE_OTHER): Payer: Medicare Other | Admitting: Cardiovascular Disease

## 2015-06-25 VITALS — BP 112/74 | HR 65 | Ht 66.5 in | Wt 219.8 lb

## 2015-06-25 DIAGNOSIS — Z5181 Encounter for therapeutic drug level monitoring: Secondary | ICD-10-CM

## 2015-06-25 DIAGNOSIS — Z7901 Long term (current) use of anticoagulants: Secondary | ICD-10-CM

## 2015-06-25 DIAGNOSIS — I481 Persistent atrial fibrillation: Secondary | ICD-10-CM | POA: Diagnosis not present

## 2015-06-25 DIAGNOSIS — I4819 Other persistent atrial fibrillation: Secondary | ICD-10-CM

## 2015-06-25 DIAGNOSIS — E785 Hyperlipidemia, unspecified: Secondary | ICD-10-CM | POA: Diagnosis not present

## 2015-06-25 NOTE — Patient Instructions (Signed)
Your physician wants you to follow-up in: 1 year You will receive a reminder letter in the mail two months in advance. If you don't receive a letter, please call our office to schedule the follow-up appointment.    Your physician recommends that you continue on your current medications as directed. Please refer to the Current Medication list given to you today.     Thank you for choosing [facility] !  

## 2015-06-25 NOTE — Progress Notes (Signed)
Patient ID: [name], female   DOB: 1951-05-16, 64 y.o.   MRN: [id number]      SUBJECTIVE: The patient is here to followup for atrial fibrillation for which she takes Eliquis 2.5 mg bid. She also has a history of hyperlipidemia and takes niacin and ASA to alleviate the side effects of niacin. She has palpitations very few weeks and are precipitated by fatigue.  She denies chest pain and leg swelling, and has not had any bleeding problems. She stays active with her grandchildren.   Review of Systems: As per "subjective", otherwise negative.  Allergies  Allergen Reactions  . Penicillins Rash    Current Outpatient Prescriptions  Medication Sig Dispense Refill  . ADVAIR DISKUS 250-50 MCG/DOSE AEPB USE 1 PUFF TWICE A DAY  12  . albuterol (PROAIR HFA) 108 (90 BASE) MCG/ACT inhaler Inhale 2 puffs into the lungs every 6 (six) hours as needed for wheezing or shortness of breath.    Marland Kitchen apixaban (ELIQUIS) 2.5 MG TABS tablet Take 1 tablet (2.5 mg total) by mouth 2 (two) times daily. 90 tablet 3  . aspirin 81 MG tablet Take 81 mg by mouth daily.    . Cholecalciferol (VITAMIN D3) 5000 UNITS CAPS Take 1 capsule by mouth daily.    Marland Kitchen CRANBERRY PO Take 1 capsule by mouth daily.    Marland Kitchen guaiFENesin (MUCINEX) 600 MG 12 hr tablet Take 600 mg by mouth daily as needed.    . meloxicam (MOBIC) 7.5 MG tablet Take 7.5 mg by mouth daily.    . metoprolol (LOPRESSOR) 50 MG tablet Take 50 mg by mouth 2 (two) times daily.    . montelukast (SINGULAIR) 10 MG tablet Take 10 mg by mouth at bedtime.    . niacin (NIASPAN) 750 MG CR tablet Take 750 mg by mouth 2 (two) times daily.  99  . Omega-3 Krill Oil 300 MG CAPS Take 1 capsule by mouth daily.    . ranitidine (ZANTAC) 150 MG tablet Take 150 mg by mouth as needed.     . tiotropium (SPIRIVA) 18 MCG inhalation capsule Place 18 mcg into inhaler and inhale daily.     No current facility-administered medications for this visit.    Past Medical History  Diagnosis Date   . COPD (chronic obstructive pulmonary disease)   . Hypercholesteremia   . Mitral valve prolapse   . Paroxysmal atrial fibrillation     Past Surgical History  Procedure Laterality Date  . Abdominal hysterectomy    . Cesarean section    . Tubal ligation      Social History   Social History  . Marital Status: Married    Spouse Name: N/A  . Number of Children: 1  . Years of Education: N/A   Occupational History  . [employer] Patroller    Social History Main Topics  . Smoking status: Former Smoker -- 1.00 packs/day for 20 years    Types: Cigarettes    Quit date: 08/14/1990  . Smokeless tobacco: Not on file  . Alcohol Use: No  . Drug Use: No  . Sexual Activity: Not on file   Other Topics Concern  . Not on file   Social History Narrative     Filed Vitals:   06/25/15 1324  BP: 112/74  Pulse: 65  Height: 5' 6.5" (1.689 m)  Weight: 219 lb 12.8 oz (99.701 kg)  SpO2: 92%    PHYSICAL EXAM General: NAD HEENT: Normal. Neck: No JVD, no thyromegaly. Lungs: Clear to auscultation bilaterally  with normal respiratory effort. CV: Nondisplaced PMI.  Regular rate and rhythm, normal S1/S2, no S3/S4, no murmur. No pretibial or periankle edema.  No carotid bruit.  Normal pedal pulses.  Abdomen: Soft, nontender, no hepatosplenomegaly, no distention.  Neurologic: Alert and oriented x 3.  Psych: Normal affect. Skin: Normal. Musculoskeletal: Normal range of motion, no gross deformities. Extremities: No clubbing or cyanosis.   ECG: Most recent ECG reviewed.      ASSESSMENT AND PLAN: 1. Persistent atrial fibrillation: Symptomatically stable. Continue low-dose Eliquis (will provide samples) and metoprolol.  2. Hyperlipidemia: Continue niacin with ASA. TG down to 185 as per patient.  Dispo: f/u 1 year.  Kate Sable, M.D., F.A.C.C.

## 2016-06-17 ENCOUNTER — Ambulatory Visit: Payer: Medicare Other | Admitting: Cardiovascular Disease

## 2016-07-14 ENCOUNTER — Ambulatory Visit (INDEPENDENT_AMBULATORY_CARE_PROVIDER_SITE_OTHER): Payer: Medicare Other | Admitting: Cardiovascular Disease

## 2016-07-14 VITALS — BP 124/76 | HR 62 | Ht 66.0 in | Wt 209.0 lb

## 2016-07-14 DIAGNOSIS — I4891 Unspecified atrial fibrillation: Secondary | ICD-10-CM | POA: Diagnosis not present

## 2016-07-14 DIAGNOSIS — I481 Persistent atrial fibrillation: Secondary | ICD-10-CM | POA: Diagnosis not present

## 2016-07-14 DIAGNOSIS — E785 Hyperlipidemia, unspecified: Secondary | ICD-10-CM

## 2016-07-14 DIAGNOSIS — I4819 Other persistent atrial fibrillation: Secondary | ICD-10-CM

## 2016-07-14 NOTE — Patient Instructions (Addendum)
Your physician wants you to follow-up in:  1 year Dr Virgina Jock will receive a reminder letter in the mail two months in advance. If you don't receive a letter, please call our office to schedule the follow-up appointment.    Your physician recommends that you continue on your current medications as directed. Please refer to the Current Medication list given to you today.  3 BOXES ELIQUIS 2.5 MG GIVEN LOT [id number] EXP 9/18 If you need a refill on your cardiac medications before your next appointment, please call your pharmacy.   Thank you for choosing [facility] !

## 2016-07-14 NOTE — Progress Notes (Signed)
SUBJECTIVE: The patient is here to followup for atrial fibrillation for which she takes Eliquis 2.5 mg bid. She also has a history of hyperlipidemia and takes niacin and ASA to alleviate the side effects of niacin. She denies chest pain and leg swelling. Seldom has palpitations.  ECG today shows sinus bradycardia with sinus arrhythmia.   Review of Systems: As per "subjective", otherwise negative.  Allergies  Allergen Reactions  . Penicillins Rash    Current Outpatient Prescriptions  Medication Sig Dispense Refill  . ADVAIR DISKUS 250-50 MCG/DOSE AEPB USE 1 PUFF TWICE A DAY  12  . albuterol (PROAIR HFA) 108 (90 BASE) MCG/ACT inhaler Inhale 2 puffs into the lungs every 6 (six) hours as needed for wheezing or shortness of breath.    Marland Kitchen apixaban (ELIQUIS) 2.5 MG TABS tablet Take 1 tablet (2.5 mg total) by mouth 2 (two) times daily. 90 tablet 3  . aspirin 81 MG tablet Take 81 mg by mouth daily.    . Cholecalciferol (VITAMIN D3) 5000 UNITS CAPS Take 1 capsule by mouth daily.    Marland Kitchen CRANBERRY PO Take 1 capsule by mouth daily.    Marland Kitchen guaiFENesin (MUCINEX) 600 MG 12 hr tablet Take 600 mg by mouth daily as needed.    . meloxicam (MOBIC) 7.5 MG tablet Take 7.5 mg by mouth daily.    . metoprolol (LOPRESSOR) 50 MG tablet Take 50 mg by mouth 2 (two) times daily.    . montelukast (SINGULAIR) 10 MG tablet Take 10 mg by mouth at bedtime.    . niacin (NIASPAN) 750 MG CR tablet Take 750 mg by mouth at bedtime.    . Omega-3 Krill Oil 300 MG CAPS Take 1 capsule by mouth daily.    . ranitidine (ZANTAC) 150 MG tablet Take 150 mg by mouth as needed.     . tiotropium (SPIRIVA) 18 MCG inhalation capsule Place 18 mcg into inhaler and inhale daily.     No current facility-administered medications for this visit.     Past Medical History:  Diagnosis Date  . COPD (chronic obstructive pulmonary disease)   . Hypercholesteremia   . Mitral valve prolapse   . Paroxysmal atrial fibrillation     Past  Surgical History:  Procedure Laterality Date  . ABDOMINAL HYSTERECTOMY    . CESAREAN SECTION    . TUBAL LIGATION      Social History   Social History  . Marital status: Married    Spouse name: N/A  . Number of children: 1  . Years of education: N/A   Occupational History  . [employer]    Social History Main Topics  . Smoking status: Former Smoker    Packs/day: 1.00    Years: 20.00    Types: Cigarettes    Quit date: 08/14/1990  . Smokeless tobacco: Not on file  . Alcohol use No  . Drug use: No  . Sexual activity: Not on file   Other Topics Concern  . Not on file   Social History Narrative  . No narrative on file     Vitals:   07/14/16 0953  BP: 124/76  Pulse: 62  SpO2: 94%  Weight: 209 lb (94.8 kg)  Height: 5\' 6"  (1.676 m)    PHYSICAL EXAM General: NAD HEENT: Normal. Neck: No JVD, no thyromegaly. Lungs: Clear to auscultation bilaterally with normal respiratory effort. CV: Nondisplaced PMI.  Regular rate and rhythm, normal S1/S2, no S3/S4, no murmur. No pretibial or periankle edema.  No carotid bruit.   Abdomen: Soft, nontender, no distention.  Neurologic: Alert and oriented.  Psych: Normal affect. Skin: Normal. Musculoskeletal: No gross deformities.    ECG: Most recent ECG reviewed.      ASSESSMENT AND PLAN: 1. Persistent atrial fibrillation: Symptomatically stable. Continue low-dose Eliquis and metoprolol.  2. Hyperlipidemia: Continue niacin with ASA.  Dispo: f/u 1 year.   Kate Sable, M.D., F.A.C.C.

## 2016-07-28 ENCOUNTER — Other Ambulatory Visit: Payer: Self-pay | Admitting: Physician Assistant

## 2016-08-26 ENCOUNTER — Ambulatory Visit (INDEPENDENT_AMBULATORY_CARE_PROVIDER_SITE_OTHER): Payer: Medicare Other

## 2016-08-26 ENCOUNTER — Encounter: Payer: Self-pay | Admitting: *Deleted

## 2016-08-26 DIAGNOSIS — Z23 Encounter for immunization: Secondary | ICD-10-CM | POA: Diagnosis not present

## 2016-10-01 ENCOUNTER — Other Ambulatory Visit: Payer: Self-pay | Admitting: Physician Assistant

## 2016-10-30 ENCOUNTER — Other Ambulatory Visit: Payer: Self-pay | Admitting: Physician Assistant

## 2017-01-12 ENCOUNTER — Other Ambulatory Visit: Payer: Self-pay | Admitting: Physician Assistant

## 2017-04-01 ENCOUNTER — Ambulatory Visit (INDEPENDENT_AMBULATORY_CARE_PROVIDER_SITE_OTHER): Payer: Medicare HMO | Admitting: Physician Assistant

## 2017-04-01 ENCOUNTER — Encounter: Payer: Self-pay | Admitting: Physician Assistant

## 2017-04-01 VITALS — BP 112/57 | HR 66 | Temp 97.9°F | Ht 66.0 in | Wt 205.8 lb

## 2017-04-01 DIAGNOSIS — I4819 Other persistent atrial fibrillation: Secondary | ICD-10-CM

## 2017-04-01 DIAGNOSIS — E7800 Pure hypercholesterolemia, unspecified: Secondary | ICD-10-CM

## 2017-04-01 DIAGNOSIS — J449 Chronic obstructive pulmonary disease, unspecified: Secondary | ICD-10-CM

## 2017-04-01 DIAGNOSIS — I481 Persistent atrial fibrillation: Secondary | ICD-10-CM

## 2017-04-01 DIAGNOSIS — I059 Rheumatic mitral valve disease, unspecified: Secondary | ICD-10-CM | POA: Diagnosis not present

## 2017-04-01 DIAGNOSIS — Z0000 Encounter for general adult medical examination without abnormal findings: Secondary | ICD-10-CM | POA: Diagnosis not present

## 2017-04-01 MED ORDER — TIOTROPIUM BROMIDE MONOHYDRATE 18 MCG IN CAPS: 18.0000 ug | ORAL_CAPSULE | Freq: Every day | RESPIRATORY_TRACT | 11 refills | Status: DC

## 2017-04-01 MED ORDER — ADVAIR DISKUS 250-50 MCG/DOSE IN AEPB: 1.0000 | INHALATION_SPRAY | Freq: Two times a day (BID) | RESPIRATORY_TRACT | 12 refills | Status: DC

## 2017-04-01 MED ORDER — MONTELUKAST SODIUM 10 MG PO TABS: 10.0000 mg | ORAL_TABLET | Freq: Every day | ORAL | 3 refills | Status: DC

## 2017-04-01 MED ORDER — METOPROLOL TARTRATE 50 MG PO TABS: 50.0000 mg | ORAL_TABLET | Freq: Two times a day (BID) | ORAL | 3 refills | Status: DC

## 2017-04-01 MED ORDER — APIXABAN 2.5 MG PO TABS: 2.5000 mg | ORAL_TABLET | Freq: Two times a day (BID) | ORAL | 11 refills | Status: DC

## 2017-04-01 MED ORDER — MELOXICAM 7.5 MG PO TABS: 7.5000 mg | ORAL_TABLET | Freq: Every day | ORAL | 3 refills | Status: DC

## 2017-04-01 MED ORDER — RANITIDINE HCL 150 MG PO TABS: 150.0000 mg | ORAL_TABLET | ORAL | 3 refills | Status: DC | PRN

## 2017-04-01 MED ORDER — ALBUTEROL SULFATE HFA 108 (90 BASE) MCG/ACT IN AERS: 2.0000 | INHALATION_SPRAY | Freq: Four times a day (QID) | RESPIRATORY_TRACT | 11 refills | Status: DC | PRN

## 2017-04-01 NOTE — Patient Instructions (Signed)
Lumbosacral Strain Lumbosacral strain is an injury that causes pain in the lower back (lumbosacral spine). This injury usually occurs from overstretching the muscles or ligaments along your spine. A strain can affect one or more muscles or cord-like tissues that connect bones to other bones (ligaments). What are the causes? This condition may be caused by:  A hard, direct hit (blow) to the back.  Excessive stretching of the lower back muscles. This may result from: ? A fall. ? Lifting something heavy. ? Repetitive movements such as bending or crouching.  What increases the risk? The following factors may increase your risk of getting this condition:  Participating in sports or activities that involve: ? A sudden twist of the back. ? Pushing or pulling motions.  Being overweight or obese.  Having poor strength and flexibility, especially tight hamstrings or weak muscles in the back or abdomen.  Having too much of a curve in the lower back.  Having a pelvis that is tilted forward.  What are the signs or symptoms? The main symptom of this condition is pain in the lower back, at the site of the strain. Pain may extend (radiate) down one or both legs. How is this diagnosed? This condition is diagnosed based on:  Your symptoms.  Your medical history.  A physical exam. ? Your health care provider may push on certain areas of your back to determine the source of your pain. ? You may be asked to bend forward, backward, and side to side to assess the severity of your pain and your range of motion.  Imaging tests, such as: ? X-rays. ? MRI.  How is this treated? Treatment for this condition may include:  Putting heat and cold on the affected area.  Medicines to help relieve pain and relax your muscles (muscle relaxants).  NSAIDs to help reduce swelling and discomfort.  When your symptoms improve, it is important to gradually return to your normal routine as soon as possible  to reduce pain, avoid stiffness, and avoid loss of muscle strength. Generally, symptoms should improve within 6 weeks of treatment. However, recovery time varies. Follow these instructions at home: Managing pain, stiffness, and swelling   If directed, put ice on the injured area during the first 24 hours after your strain. ? Put ice in a plastic bag. ? Place a towel between your skin and the bag. ? Leave the ice on for 20 minutes, 2-3 times a day.  If directed, put heat on the affected area as often as told by your health care provider. Use the heat source that your health care provider recommends, such as a moist heat pack or a heating pad. ? Place a towel between your skin and the heat source. ? Leave the heat on for 20-30 minutes. ? Remove the heat if your skin turns bright red. This is especially important if you are unable to feel pain, heat, or cold. You may have a greater risk of getting burned. Activity  Rest and return to your normal activities as told by your health care provider. Ask your health care provider what activities are safe for you.  Avoid activities that take a lot of energy for as long as told by your health care provider. General instructions  Take over-the-counter and prescription medicines only as told by your health care provider.  Donot drive or use heavy machinery while taking prescription pain medicine.  Do not use any products that contain nicotine or tobacco, such as cigarettes and e-cigarettes.   If you need help quitting, ask your health care provider.  Keep all follow-up visits as told by your health care provider. This is important. How is this prevented?  Use correct form when playing sports and lifting heavy objects.  Use good posture when sitting and standing.  Maintain a healthy weight.  Sleep on a mattress with medium firmness to support your back.  Be safe and responsible while being active to avoid falls.  Do at least 150 minutes of  moderate-intensity exercise each week, such as brisk walking or water aerobics. Try a form of exercise that takes stress off your back, such as swimming or stationary cycling.  Maintain physical fitness, including: ? Strength. ? Flexibility. ? Cardiovascular fitness. ? Endurance. Contact a health care provider if:  Your back pain does not improve after 6 weeks of treatment.  Your symptoms get worse. Get help right away if:  Your back pain is severe.  You cannot stand or walk.  You have difficulty controlling when you urinate or when you have a bowel movement.  You feel nauseous or you vomit.  Your feet get very cold.  You have numbness, tingling, weakness, or problems using your arms or legs.  You develop any of the following: ? Shortness of breath. ? Dizziness. ? Pain in your legs. ? Weakness in your buttocks or legs. ? Discoloration of the skin on your toes or legs. This information is not intended to replace advice given to you by your health care provider. Make sure you discuss any questions you have with your health care provider. Document Released: 07/30/2005 Document Revised: 05/09/2016 Document Reviewed: 03/23/2016 Elsevier Interactive Patient Education  2017 [employer]  

## 2017-04-02 LAB — LIPID PANEL
CHOL/HDL RATIO: 4.4 ratio (ref 0.0–4.4)
Cholesterol, Total: 196 mg/dL (ref 100–199)
HDL: 45 mg/dL (ref >39)
LDL Calculated: 119 mg/dL — ABNORMAL HIGH (ref 0–99)
TRIGLYCERIDES: 161 mg/dL — AB (ref 0–149)
VLDL Cholesterol Cal: 32 mg/dL (ref 5–40)

## 2017-04-02 LAB — CBC WITH DIFFERENTIAL/PLATELET
Basophils Absolute: 0 10*3/uL (ref 0.0–0.2)
Basos: 0 %
EOS (ABSOLUTE): 0.2 10*3/uL (ref 0.0–0.4)
Eos: 4 %
HEMATOCRIT: 37.8 % (ref 34.0–46.6)
Hemoglobin: 12.6 g/dL (ref 11.1–15.9)
Immature Grans (Abs): 0 10*3/uL (ref 0.0–0.1)
Immature Granulocytes: 0 %
LYMPHS ABS: 1.6 10*3/uL (ref 0.7–3.1)
Lymphs: 30 %
MCH: 29.6 pg (ref 26.6–33.0)
MCHC: 33.3 g/dL (ref 31.5–35.7)
MCV: 89 fL (ref 79–97)
MONOS ABS: 0.3 10*3/uL (ref 0.1–0.9)
Monocytes: 6 %
Neutrophils Absolute: 3.1 10*3/uL (ref 1.4–7.0)
Neutrophils: 60 %
Platelets: 341 10*3/uL (ref 150–379)
RBC: 4.26 x10E6/uL (ref 3.77–5.28)
RDW: 13.8 % (ref 12.3–15.4)
WBC: 5.2 10*3/uL (ref 3.4–10.8)

## 2017-04-02 LAB — CMP14+EGFR
ALT: 15 IU/L (ref 0–32)
AST: 19 IU/L (ref 0–40)
Albumin/Globulin Ratio: 1.4 (ref 1.2–2.2)
Albumin: 3.9 g/dL (ref 3.6–4.8)
Alkaline Phosphatase: 94 IU/L (ref 39–117)
BUN/Creatinine Ratio: 23 (ref 12–28)
BUN: 17 mg/dL (ref 8–27)
Bilirubin Total: 1 mg/dL (ref 0.0–1.2)
CALCIUM: 9.3 mg/dL (ref 8.7–10.3)
CO2: 27 mmol/L (ref 18–29)
CREATININE: 0.74 mg/dL (ref 0.57–1.00)
Chloride: 100 mmol/L (ref 96–106)
GFR calc Af Amer: 96 mL/min/{1.73_m2} (ref >59)
GFR, EST NON AFRICAN AMERICAN: 84 mL/min/{1.73_m2} (ref >59)
GLUCOSE: 97 mg/dL (ref 65–99)
Globulin, Total: 2.7 g/dL (ref 1.5–4.5)
Potassium: 4.5 mmol/L (ref 3.5–5.2)
Sodium: 142 mmol/L (ref 134–144)
Total Protein: 6.6 g/dL (ref 6.0–8.5)

## 2017-04-02 LAB — TSH: TSH: 2.1 u[IU]/mL (ref 0.450–4.500)

## 2017-04-02 NOTE — Progress Notes (Signed)
BP (!) 112/57   Pulse 66   Temp 97.9 F (36.6 C) (Oral)   Ht '5\' 6"'  (1.676 m)   Wt 205 lb 12.8 oz (93.4 kg)   BMI 33.22 kg/m    Subjective:    Patient ID: [name], female    DOB: 11-Apr-1951, 66 y.o.   MRN: [id number]  HPI: JENILEE FRANEY is a 66 y.o. female presenting on 04/01/2017 for Establish Care  This patient comes in for periodic recheck on medications and conditions including COPD, atrial fibrillation, hypercholesterolemia, mitral valve prolapse. She reports that she is doing very well except lumbar pain that will radiate down leg. She has some weakness in legs.   All medications are reviewed today. There are no reports of any problems with the medications. All of the medical conditions are reviewed and updated.  Lab work is reviewed and will be ordered as medically necessary. There are no new problems reported with today's visit.   Relevant past medical, surgical, family and social history reviewed and updated as indicated. Allergies and medications reviewed and updated.  Past Medical History:  Diagnosis Date  . COPD (chronic obstructive pulmonary disease) ([place])   . Hypercholesteremia   . Mitral valve prolapse   . Paroxysmal atrial fibrillation [facility])     Past Surgical History:  Procedure Laterality Date  . ABDOMINAL HYSTERECTOMY    . CESAREAN SECTION    . TUBAL LIGATION      Review of Systems  Constitutional: Negative.  Negative for activity change, fatigue and fever.  HENT: Negative.   Eyes: Negative.   Respiratory: Negative.  Negative for cough.   Cardiovascular: Negative.  Negative for chest pain.  Gastrointestinal: Negative.  Negative for abdominal pain.  Endocrine: Negative.   Genitourinary: Negative.  Negative for dysuria.  Musculoskeletal: Negative.   Skin: Negative.   Neurological: Negative.     Allergies as of 04/01/2017      Reactions   Penicillins Rash      Medication List       Accurate as of 04/01/17 11:59 PM. Always use your  most recent med list.          ADVAIR DISKUS 250-50 MCG/DOSE Aepb Generic drug:  Fluticasone-Salmeterol Inhale 1 puff into the lungs 2 (two) times daily.   albuterol 108 (90 Base) MCG/ACT inhaler Commonly known as:  PROAIR HFA Inhale 2 puffs into the lungs every 6 (six) hours as needed for wheezing or shortness of breath.   apixaban 2.5 MG Tabs tablet Commonly known as:  ELIQUIS Take 1 tablet (2.5 mg total) by mouth 2 (two) times daily.   aspirin 81 MG tablet Take 81 mg by mouth daily.   CRANBERRY PO Take 1 capsule by mouth daily.   guaiFENesin 600 MG 12 hr tablet Commonly known as:  MUCINEX Take 600 mg by mouth daily as needed.   meloxicam 7.5 MG tablet Commonly known as:  MOBIC Take 1 tablet (7.5 mg total) by mouth daily.   metoprolol tartrate 50 MG tablet Commonly known as:  LOPRESSOR Take 1 tablet (50 mg total) by mouth 2 (two) times daily.   montelukast 10 MG tablet Commonly known as:  SINGULAIR Take 1 tablet (10 mg total) by mouth at bedtime.   ranitidine 150 MG tablet Commonly known as:  ZANTAC Take 1 tablet (150 mg total) by mouth as needed.   tiotropium 18 MCG inhalation capsule Commonly known as:  SPIRIVA Place 1 capsule (18 mcg total) into inhaler and inhale daily.  Vitamin D3 5000 units Caps Take 1 capsule by mouth daily.          Objective:    BP (!) 112/57   Pulse 66   Temp 97.9 F (36.6 C) (Oral)   Ht '5\' 6"'  (1.676 m)   Wt 205 lb 12.8 oz (93.4 kg)   BMI 33.22 kg/m   Allergies  Allergen Reactions  . Penicillins Rash    Physical Exam  Constitutional: She is oriented to person, place, and time. She appears well-developed and well-nourished.  HENT:  Head: Normocephalic and atraumatic.  Right Ear: Tympanic membrane, external ear and ear canal normal.  Left Ear: Tympanic membrane, external ear and ear canal normal.  Nose: Nose normal. No rhinorrhea.  Mouth/Throat: Oropharynx is clear and moist and mucous membranes are normal. No  oropharyngeal exudate or posterior oropharyngeal erythema.  Eyes: Conjunctivae and EOM are normal. Pupils are equal, round, and reactive to light.  Neck: Normal range of motion. Neck supple.  Cardiovascular: Normal rate, regular rhythm, normal heart sounds and intact distal pulses.   Pulmonary/Chest: Effort normal and breath sounds normal.  Abdominal: Soft. Bowel sounds are normal.  Neurological: She is alert and oriented to person, place, and time. She has normal reflexes.  Skin: Skin is warm and dry. No rash noted.  Psychiatric: She has a normal mood and affect. Her behavior is normal. Judgment and thought content normal.  Nursing note and vitals reviewed.   Results for orders placed or performed in visit on 04/01/17  CBC with Differential/Platelet  Result Value Ref Range   WBC 5.2 3.4 - 10.8 x10E3/uL   RBC 4.26 3.77 - 5.28 x10E6/uL   Hemoglobin 12.6 11.1 - 15.9 g/dL   Hematocrit 37.8 34.0 - 46.6 %   MCV 89 79 - 97 fL   MCH 29.6 26.6 - 33.0 pg   MCHC 33.3 31.5 - 35.7 g/dL   RDW 13.8 12.3 - 15.4 %   Platelets 341 150 - 379 x10E3/uL   Neutrophils 60 Not Estab. %   Lymphs 30 Not Estab. %   Monocytes 6 Not Estab. %   Eos 4 Not Estab. %   Basos 0 Not Estab. %   Neutrophils Absolute 3.1 1.4 - 7.0 x10E3/uL   Lymphocytes Absolute 1.6 0.7 - 3.1 x10E3/uL   Monocytes Absolute 0.3 0.1 - 0.9 x10E3/uL   EOS (ABSOLUTE) 0.2 0.0 - 0.4 x10E3/uL   Basophils Absolute 0.0 0.0 - 0.2 x10E3/uL   Immature Granulocytes 0 Not Estab. %   Immature Grans (Abs) 0.0 0.0 - 0.1 x10E3/uL  Lipid panel  Result Value Ref Range   Cholesterol, Total 196 100 - 199 mg/dL   Triglycerides 161 (H) 0 - 149 mg/dL   HDL 45 >39 mg/dL   VLDL Cholesterol Cal 32 5 - 40 mg/dL   LDL Calculated 119 (H) 0 - 99 mg/dL   Chol/HDL Ratio 4.4 0.0 - 4.4 ratio  CMP14+EGFR  Result Value Ref Range   Glucose 97 65 - 99 mg/dL   BUN 17 8 - 27 mg/dL   Creatinine, Ser 0.74 0.57 - 1.00 mg/dL   GFR calc non Af Amer 84 >59 mL/min/1.73    GFR calc Af Amer 96 >59 mL/min/1.73   BUN/Creatinine Ratio 23 12 - 28   Sodium 142 134 - 144 mmol/L   Potassium 4.5 3.5 - 5.2 mmol/L   Chloride 100 96 - 106 mmol/L   CO2 27 18 - 29 mmol/L   Calcium 9.3 8.7 - 10.3 mg/dL  Total Protein 6.6 6.0 - 8.5 g/dL   Albumin 3.9 3.6 - 4.8 g/dL   Globulin, Total 2.7 1.5 - 4.5 g/dL   Albumin/Globulin Ratio 1.4 1.2 - 2.2   Bilirubin Total 1.0 0.0 - 1.2 mg/dL   Alkaline Phosphatase 94 39 - 117 IU/L   AST 19 0 - 40 IU/L   ALT 15 0 - 32 IU/L  TSH  Result Value Ref Range   TSH 2.100 0.450 - 4.500 uIU/mL      Assessment & Plan:   1. COPD  GOLD III - tiotropium (SPIRIVA) 18 MCG inhalation capsule; Place 1 capsule (18 mcg total) into inhaler and inhale daily.  Dispense: 30 capsule; Refill: 11 - albuterol (PROAIR HFA) 108 (90 Base) MCG/ACT inhaler; Inhale 2 puffs into the lungs every 6 (six) hours as needed for wheezing or shortness of breath.  Dispense: 1 Inhaler; Refill: 11 - ADVAIR DISKUS 250-50 MCG/DOSE AEPB; Inhale 1 puff into the lungs 2 (two) times daily.  Dispense: 60 each; Refill: 12 - CBC with Differential/Platelet - Lipid panel - CMP14+EGFR - TSH  2. Persistent atrial fibrillation (HCC) - apixaban (ELIQUIS) 2.5 MG TABS tablet; Take 1 tablet (2.5 mg total) by mouth 2 (two) times daily.  Dispense: 60 tablet; Refill: 11 - metoprolol tartrate (LOPRESSOR) 50 MG tablet; Take 1 tablet (50 mg total) by mouth 2 (two) times daily.  Dispense: 180 tablet; Refill: 3 - CBC with Differential/Platelet - Lipid panel - CMP14+EGFR - TSH  3. Hypercholesteremia  4. Mitral valve disease  5. Well adult exam - CBC with Differential/Platelet - Lipid panel - CMP14+EGFR - TSH   Current Outpatient Prescriptions:  .  ADVAIR DISKUS 250-50 MCG/DOSE AEPB, Inhale 1 puff into the lungs 2 (two) times daily., Disp: 60 each, Rfl: 12 .  albuterol (PROAIR HFA) 108 (90 Base) MCG/ACT inhaler, Inhale 2 puffs into the lungs every 6 (six) hours as needed for wheezing or  shortness of breath., Disp: 1 Inhaler, Rfl: 11 .  apixaban (ELIQUIS) 2.5 MG TABS tablet, Take 1 tablet (2.5 mg total) by mouth 2 (two) times daily., Disp: 60 tablet, Rfl: 11 .  aspirin 81 MG tablet, Take 81 mg by mouth daily., Disp: , Rfl:  .  Cholecalciferol (VITAMIN D3) 5000 UNITS CAPS, Take 1 capsule by mouth daily., Disp: , Rfl:  .  CRANBERRY PO, Take 1 capsule by mouth daily., Disp: , Rfl:  .  guaiFENesin (MUCINEX) 600 MG 12 hr tablet, Take 600 mg by mouth daily as needed., Disp: , Rfl:  .  meloxicam (MOBIC) 7.5 MG tablet, Take 1 tablet (7.5 mg total) by mouth daily., Disp: 90 tablet, Rfl: 3 .  metoprolol tartrate (LOPRESSOR) 50 MG tablet, Take 1 tablet (50 mg total) by mouth 2 (two) times daily., Disp: 180 tablet, Rfl: 3 .  montelukast (SINGULAIR) 10 MG tablet, Take 1 tablet (10 mg total) by mouth at bedtime., Disp: 90 tablet, Rfl: 3 .  ranitidine (ZANTAC) 150 MG tablet, Take 1 tablet (150 mg total) by mouth as needed., Disp: 90 tablet, Rfl: 3 .  tiotropium (SPIRIVA) 18 MCG inhalation capsule, Place 1 capsule (18 mcg total) into inhaler and inhale daily., Disp: 30 capsule, Rfl: 11  Continue all other maintenance medications as listed above.  Follow up plan: Return in about 6 months (around 10/02/2017) for recheck.  Educational handout given for lumbar strain  Terald Sleeper PA-C [place] [address]  [place], [place] [address] [phone]   04/02/2017, 11:03 PM

## 2017-04-04 ENCOUNTER — Ambulatory Visit (INDEPENDENT_AMBULATORY_CARE_PROVIDER_SITE_OTHER): Payer: Medicare HMO | Admitting: Family Medicine

## 2017-04-04 ENCOUNTER — Encounter: Payer: Self-pay | Admitting: Family Medicine

## 2017-04-04 VITALS — BP 97/67 | HR 82 | Temp 97.7°F | Ht 66.0 in | Wt 208.0 lb

## 2017-04-04 DIAGNOSIS — N3001 Acute cystitis with hematuria: Secondary | ICD-10-CM

## 2017-04-04 DIAGNOSIS — R300 Dysuria: Secondary | ICD-10-CM | POA: Diagnosis not present

## 2017-04-04 MED ORDER — CEPHALEXIN 500 MG PO CAPS: 500.0000 mg | ORAL_CAPSULE | Freq: Four times a day (QID) | ORAL | 0 refills | Status: DC

## 2017-04-04 NOTE — Progress Notes (Signed)
BP 97/67 (BP Location: Left Arm, Patient Position: Sitting, Cuff Size: Large)   Pulse 82   Temp 97.7 F (36.5 C) (Oral)   Ht 5\' 6"  (1.676 m)   Wt 208 lb (94.3 kg)   BMI 33.57 kg/m    Subjective:    Patient ID: [name], female    DOB: 01-29-1951, 66 y.o.   MRN: [id number]  HPI: [name] is a 66 y.o. female presenting on 04/04/2017 for Urinary Tract Infection (lower abd pressure with urgency, x > 1 wk)   HPI Urinary tract infection Patient comes in today for a urinary tract infection symptoms. She feels like she is having lower abdominal pressure and urgency and frequency that has been increasing over the past week. She denies any hematuria but has had dysuria. She denies any fevers or chills or flank pain. She does get these frequently about a couple times a year but usually clear pretty well. She is taking oral cranberry pills as well.  Relevant past medical, surgical, family and social history reviewed and updated as indicated. Interim medical history since our last visit reviewed. Allergies and medications reviewed and updated.  Review of Systems  Constitutional: Negative for chills and fever.  HENT: Negative for congestion, ear discharge and ear pain.   Eyes: Negative for redness and visual disturbance.  Respiratory: Negative for chest tightness and shortness of breath.   Cardiovascular: Negative for chest pain and leg swelling.  Gastrointestinal: Positive for abdominal pain. Negative for constipation, diarrhea, nausea and vomiting.  Genitourinary: Positive for dysuria, frequency and urgency. Negative for decreased urine volume, difficulty urinating, flank pain, hematuria, vaginal bleeding, vaginal discharge and vaginal pain.  Musculoskeletal: Negative for back pain and gait problem.  Psychiatric/Behavioral: Negative for agitation and behavioral problems.  All other systems reviewed and are negative.   Per HPI unless specifically indicated above          Objective:    BP 97/67 (BP Location: Left Arm, Patient Position: Sitting, Cuff Size: Large)   Pulse 82   Temp 97.7 F (36.5 C) (Oral)   Ht 5\' 6"  (1.676 m)   Wt 208 lb (94.3 kg)   BMI 33.57 kg/m   Wt Readings from Last 3 Encounters:  04/04/17 208 lb (94.3 kg)  04/01/17 205 lb 12.8 oz (93.4 kg)  07/14/16 209 lb (94.8 kg)    Physical Exam  Constitutional: She is oriented to person, place, and time. She appears well-developed and well-nourished. No distress.  Eyes: Conjunctivae are normal.  Cardiovascular: Normal rate, regular rhythm, normal heart sounds and intact distal pulses.   No murmur heard. Pulmonary/Chest: Effort normal and breath sounds normal. No respiratory distress. She has no wheezes.  Abdominal: Soft. Bowel sounds are normal. She exhibits no distension. There is tenderness (Mild suprapubic tenderness, no CVA tenderness). There is no rebound and no guarding.  Musculoskeletal: Normal range of motion. She exhibits no edema or tenderness.  Neurological: She is alert and oriented to person, place, and time. Coordination normal.  Skin: Skin is warm and dry. No rash noted. She is not diaphoretic.  Psychiatric: She has a normal mood and affect. Her behavior is normal.  Nursing note and vitals reviewed.  Urine dip: 3+ blood, 1+ protein, 3+ leukocytes     Assessment & Plan:   Problem List Items Addressed This Visit    None    Visit Diagnoses    Acute cystitis with hematuria    -  Primary   Relevant Medications  cephALEXin (KEFLEX) 500 MG capsule   Other Relevant Orders   Urine culture   Urinalysis       Follow up plan: Return if symptoms worsen or fail to improve.  Counseling provided for all of the vaccine components Orders Placed This Encounter  Procedures  . Urine culture  . Urinalysis    Caryl Pina, MD [facility] 04/04/2017, 10:34 AM

## 2017-04-06 ENCOUNTER — Other Ambulatory Visit: Payer: Self-pay | Admitting: Physician Assistant

## 2017-04-06 DIAGNOSIS — R928 Other abnormal and inconclusive findings on diagnostic imaging of breast: Secondary | ICD-10-CM

## 2017-04-06 LAB — URINALYSIS
BILIRUBIN UA: NEGATIVE
Glucose, UA: NEGATIVE
Ketones, UA: NEGATIVE
NITRITE UA: NEGATIVE
PH UA: 5.5 (ref 5.0–7.5)
SPEC GRAV UA: 1.025 (ref 1.005–1.030)
UUROB: 0.2 mg/dL (ref 0.2–1.0)

## 2017-04-07 LAB — URINE CULTURE

## 2017-04-10 ENCOUNTER — Encounter: Payer: Self-pay | Admitting: Physician Assistant

## 2017-04-22 ENCOUNTER — Encounter: Payer: Self-pay | Admitting: Physician Assistant

## 2017-04-22 MED ORDER — FLUCONAZOLE 150 MG PO TABS: 150.0000 mg | ORAL_TABLET | Freq: Once | ORAL | 0 refills | Status: AC

## 2017-04-30 DIAGNOSIS — N6489 Other specified disorders of breast: Secondary | ICD-10-CM | POA: Diagnosis not present

## 2017-05-10 ENCOUNTER — Emergency Department (HOSPITAL_COMMUNITY): Payer: Medicare HMO

## 2017-05-10 ENCOUNTER — Emergency Department (HOSPITAL_COMMUNITY)
Admission: EM | Admit: 2017-05-10 | Discharge: 2017-05-10 | Disposition: A | Discharge status: Home / Self Care | Payer: Medicare HMO | Attending: Emergency Medicine | Admitting: Emergency Medicine

## 2017-05-10 ENCOUNTER — Encounter (HOSPITAL_COMMUNITY): Payer: Self-pay

## 2017-05-10 DIAGNOSIS — Z7901 Long term (current) use of anticoagulants: Secondary | ICD-10-CM | POA: Insufficient documentation

## 2017-05-10 DIAGNOSIS — I341 Nonrheumatic mitral (valve) prolapse: Secondary | ICD-10-CM | POA: Insufficient documentation

## 2017-05-10 DIAGNOSIS — R002 Palpitations: Secondary | ICD-10-CM | POA: Diagnosis present

## 2017-05-10 DIAGNOSIS — Z87891 Personal history of nicotine dependence: Secondary | ICD-10-CM | POA: Diagnosis not present

## 2017-05-10 DIAGNOSIS — J449 Chronic obstructive pulmonary disease, unspecified: Secondary | ICD-10-CM | POA: Diagnosis not present

## 2017-05-10 DIAGNOSIS — I480 Paroxysmal atrial fibrillation: Secondary | ICD-10-CM

## 2017-05-10 DIAGNOSIS — Z79899 Other long term (current) drug therapy: Secondary | ICD-10-CM | POA: Insufficient documentation

## 2017-05-10 DIAGNOSIS — Z7982 Long term (current) use of aspirin: Secondary | ICD-10-CM | POA: Insufficient documentation

## 2017-05-10 DIAGNOSIS — R000 Tachycardia, unspecified: Secondary | ICD-10-CM | POA: Diagnosis not present

## 2017-05-10 LAB — CBC
HEMATOCRIT: 41.8 % (ref 36.0–46.0)
Hemoglobin: 13.8 g/dL (ref 12.0–15.0)
MCH: 30.1 pg (ref 26.0–34.0)
MCHC: 33 g/dL (ref 30.0–36.0)
MCV: 91.3 fL (ref 78.0–100.0)
Platelets: 301 10*3/uL (ref 150–400)
RBC: 4.58 MIL/uL (ref 3.87–5.11)
RDW: 12.9 % (ref 11.5–15.5)
WBC: 7.3 10*3/uL (ref 4.0–10.5)

## 2017-05-10 LAB — BASIC METABOLIC PANEL
Anion gap: 9 (ref 5–15)
BUN: 5 mg/dL — AB (ref 6–20)
CO2: 25 mmol/L (ref 22–32)
Calcium: 8.9 mg/dL (ref 8.9–10.3)
Chloride: 105 mmol/L (ref 101–111)
Creatinine, Ser: 0.65 mg/dL (ref 0.44–1.00)
GFR calc Af Amer: >60 mL/min (ref >60)
GLUCOSE: 121 mg/dL — AB (ref 65–99)
POTASSIUM: 4 mmol/L (ref 3.5–5.1)
Sodium: 139 mmol/L (ref 135–145)

## 2017-05-10 LAB — TSH: TSH: 1.569 u[IU]/mL (ref 0.350–4.500)

## 2017-05-10 LAB — I-STAT TROPONIN, ED: Troponin i, poc: 0.01 ng/mL (ref 0.00–0.08)

## 2017-05-10 LAB — PROTIME-INR
INR: 1.09
Prothrombin Time: 14.1 seconds (ref 11.4–15.2)

## 2017-05-10 LAB — T4, FREE: Free T4: 1.01 ng/dL (ref 0.61–1.12)

## 2017-05-10 NOTE — Discharge Instructions (Signed)
Continue taking your home medications as prescribed including your prescription of Eliquis and Metoprolol. Call your cardiologist's office tomorrow morning to schedule a follow-up appointment for this week for follow-up evaluation regarding your A. fib. Please return to the Emergency Department if symptoms worsen or new onset of fever, lightheadedness/dizziness, chest pain, shortness of breath, abdominal pain, vomiting, numbness, weakness, syncope.

## 2017-05-10 NOTE — ED Triage Notes (Signed)
Patient complains of recurrent atrial fib that started yesterday. States that this normally last only a few hours and has persisted throughout the night. No pain only palpitations and feeling her heart race, no SOB.heart rate 140's on arrival

## 2017-05-10 NOTE — ED Provider Notes (Signed)
[facility] DEPT Provider Note   CSN: [id number] Arrival date & time: 05/10/17  0800     History   Chief Complaint No chief complaint on file.   HPI [name] is a 66 y.o. female.  HPI   Patient is a 66 year old female with history of A. fib on Eliquis, COPD, HLD and mitral valve prolapse who presents to the ED with complaint of palpitations. Patient reports yesterday afternoon after eating she began having palpitations which she describes as "my heart is racing". She reports monitoring her heart rate at home and reporting HR 127-137 but notes at night her heart rate improved into the 70s without any symptoms. She states when she woke up this morning her symptoms continued resulting her coming to the ED. She reports having similar episodes of A. fib in the past but notes they typically resolve on their own. She states she has been taking her home prescriptions of L Oquist and metoprolol as prescribed without any recent changes in dosages. Denies fever, headache, lightheadedness, dizziness, nasal congestion, rhinorrhea, cough, shortness of breath, chest pain, wheezing, abdominal pain, nausea, vomiting, diarrhea, urinary symptoms, numbness, tingling, weakness, syncope.  PCP- Dr. Ronnald Ramp Cardiologist- Dr. Bronson Ing  Past Medical History:  Diagnosis Date  . COPD (chronic obstructive pulmonary disease) ([place])   . Hypercholesteremia   . Mitral valve prolapse   . Paroxysmal atrial fibrillation [facility])     Patient Active Problem List   Diagnosis Date Noted  . Multiple pulmonary nodules 09/07/2013  . Atrial fibrillation ([place]) 08/30/2013  . Mitral valve disease 08/30/2013  . Hypokalemia 08/16/2013  . Anemia 08/16/2013  . Leukocytosis, unspecified 08/16/2013  . COPD  GOLD III 08/14/2013  . Obesity, unspecified 08/14/2013  . Hypercholesteremia 08/14/2013    Past Surgical History:  Procedure Laterality Date  . ABDOMINAL HYSTERECTOMY    . CESAREAN SECTION    . TUBAL LIGATION        OB History    No data available       Home Medications    Prior to Admission medications   Medication Sig Start Date End Date Taking? Authorizing Provider  ADVAIR DISKUS 250-50 MCG/DOSE AEPB Inhale 1 puff into the lungs 2 (two) times daily. 04/01/17 05/10/17 Yes Terald Sleeper, PA-C  albuterol (PROAIR HFA) 108 (90 Base) MCG/ACT inhaler Inhale 2 puffs into the lungs every 6 (six) hours as needed for wheezing or shortness of breath. 04/01/17  Yes Terald Sleeper, PA-C  apixaban (ELIQUIS) 2.5 MG TABS tablet Take 1 tablet (2.5 mg total) by mouth 2 (two) times daily. 04/01/17  Yes Terald Sleeper, PA-C  aspirin 81 MG tablet Take 81 mg by mouth daily.   Yes [provider]  Cholecalciferol (VITAMIN D3) 5000 UNITS CAPS Take 1 capsule by mouth daily.   Yes [provider]  CRANBERRY PO Take 1 capsule by mouth daily.   Yes [provider]  guaiFENesin (MUCINEX) 600 MG 12 hr tablet Take 600 mg by mouth every morning.  08/16/13  Yes Black, Lezlie Octave, NP  meloxicam (MOBIC) 7.5 MG tablet Take 1 tablet (7.5 mg total) by mouth daily. 04/01/17  Yes Terald Sleeper, PA-C  metoprolol tartrate (LOPRESSOR) 50 MG tablet Take 1 tablet (50 mg total) by mouth 2 (two) times daily. 04/01/17  Yes Terald Sleeper, PA-C  montelukast (SINGULAIR) 10 MG tablet Take 1 tablet (10 mg total) by mouth at bedtime. Patient taking differently: Take 10 mg by mouth every morning.  04/01/17  Yes  Terald Sleeper, PA-C  ranitidine (ZANTAC) 150 MG tablet Take 1 tablet (150 mg total) by mouth as needed. Patient taking differently: Take 150 mg by mouth daily as needed for heartburn.  04/01/17  Yes Terald Sleeper, PA-C  tiotropium (SPIRIVA) 18 MCG inhalation capsule Place 1 capsule (18 mcg total) into inhaler and inhale daily. 04/01/17  Yes Terald Sleeper, PA-C  cephALEXin (KEFLEX) 500 MG capsule Take 1 capsule (500 mg total) by mouth 4 (four) times daily. Patient not taking: Reported on 05/10/2017 04/04/17   Dettinger, Fransisca Kaufmann, MD    Family History Family History  Problem Relation Age of Onset  . Heart attack Mother 22  . Asthma Mother   . Emphysema Father        smoked    Social History Social History  Substance Use Topics  . Smoking status: Former Smoker    Packs/day: 1.00    Years: 20.00    Types: Cigarettes    Quit date: 08/14/1990  . Smokeless tobacco: Never Used  . Alcohol use No     Allergies   Penicillins   Review of Systems Review of Systems  Cardiovascular: Positive for palpitations.  All other systems reviewed and are negative.    Physical Exam Updated Vital Signs BP 131/87   Pulse 70   Temp 98.1 F (36.7 C) (Oral)   Resp 17   Ht 5\' 6"  (1.676 m)   Wt 97.1 kg (214 lb)   SpO2 96%   BMI 34.54 kg/m   Physical Exam  Constitutional: She is oriented to person, place, and time. She appears well-developed and well-nourished. No distress.  HENT:  Head: Normocephalic and atraumatic.  Mouth/Throat: Oropharynx is clear and moist. No oropharyngeal exudate.  Eyes: Conjunctivae and EOM are normal. Right eye exhibits no discharge. Left eye exhibits no discharge. No scleral icterus.  Neck: Normal range of motion. Neck supple.  Cardiovascular: Normal heart sounds and intact distal pulses.   Irregularly irregular rhythm, HR 133  Pulmonary/Chest: Effort normal and breath sounds normal. No respiratory distress. She has no wheezes. She has no rales. She exhibits no tenderness.  Abdominal: Soft. Bowel sounds are normal. She exhibits no distension and no mass. There is no tenderness. There is no rebound and no guarding. No hernia.  Musculoskeletal: Normal range of motion. She exhibits no edema.  Neurological: She is alert and oriented to person, place, and time.  Skin: Skin is warm and dry. She is not diaphoretic.  Nursing note and vitals reviewed.    ED Treatments / Results  Labs (all labs ordered are listed, but only abnormal results are displayed) Labs Reviewed  BASIC METABOLIC  PANEL - Abnormal; Notable for the following:       Result Value   Glucose, Bld 121 (*)    BUN 5 (*)    All other components within normal limits  CBC  PROTIME-INR  TSH  T4, FREE  I-STAT TROPOININ, ED    EKG  EKG Interpretation  Date/Time:  Sunday May 10 2017 08:09:10 EDT Ventricular Rate:  138 PR Interval:  144 QRS Duration: 108 QT Interval:  290 QTC Calculation: 439 R Axis:   -5 Text Interpretation:  Sinus tachycardia Incomplete right bundle branch block Borderline ECG No significant change since last tracing Confirmed by Gareth Morgan [phone]) on 05/10/2017 9:14:27 AM       Radiology Dg Chest 2 View  Result Date: 05/10/2017 CLINICAL DATA:  Tachycardia. EXAM: CHEST  2 VIEW COMPARISON:  Chest  radiograph 10/03/2013 FINDINGS: Normal cardiac and mediastinal contours. Bibasilar linear opacities. No pleural effusion or pneumothorax. Thoracic spine degenerative changes. IMPRESSION: No acute cardiopulmonary process. Basilar scarring/ atelectasis. Electronically Signed   By: Lovey Newcomer M.D.   On: 05/10/2017 08:34    Procedures Procedures (including critical care time)  Medications Ordered in ED Medications - No data to display   Initial Impression / Assessment and Plan / ED Course  I have reviewed the triage vital signs and the nursing notes.  Pertinent labs & imaging results that were available during my care of the patient were reviewed by me and considered in my medical decision making (see chart for details).     Patient presents with palpitations consistent with previous episodes of A. fib she has had in the past. History of A. fib, currently on L Oquist and metoprolol which she reports being compliant. On initial exam, HR 133 in afib on monitor. Remaining vital stable. Exam unremarkable. Patient denies any chest pain or shortness of breath. Labs unremarkable. Chest x-ray negative. Initial EKG showed sinus tachycardia, heart rate 138. Repeat EKG showed A. fib, heart rate  105. On reevaluation, pt sitting resting in bed without any complaints. HR in 80s. Discussed pt with Dr. Billy Fischer. Plan to observe pt in the ED until remaining labs have resulted, if HR >100 will give pt dose of metoprolol. Remaining labs (TSH, T4) unremarkable. Review of monitor vitals shows HR 70s-90s over the past 1.5hours with single episode of mild tachycardic (HR 110) while walking to use restroom. On reevaluation pt continues to deny any sxs, HR 83. Patient has remained hemodynamically stable on the ED. Suspect patient's symptoms are likely due to episodes of paroxysmal A. fib, due to patient with improved heart rate over the past hour and improvement of symptoms do not feel that any further medication/treatment is warranted at this time. Advised patient to take her afternoon/evening dose of metoprolol as prescribed today. Plan to discharge patient home with complaint of calling her cardiologist tomorrow morning to schedule a follow-up appointment. Discussed return precautions.  Final Clinical Impressions(s) / ED Diagnoses   Final diagnoses:  Paroxysmal A-fib [facility])    New Prescriptions New Prescriptions   No medications on file     Nona Dell, Hershal Coria 05/10/17 [place], MD 05/10/17 2211

## 2017-05-12 ENCOUNTER — Encounter: Payer: Self-pay | Admitting: Cardiovascular Disease

## 2017-05-12 ENCOUNTER — Telehealth: Payer: Self-pay | Admitting: Cardiovascular Disease

## 2017-05-12 ENCOUNTER — Ambulatory Visit (INDEPENDENT_AMBULATORY_CARE_PROVIDER_SITE_OTHER): Payer: Medicare HMO | Admitting: Cardiovascular Disease

## 2017-05-12 VITALS — BP 121/77 | HR 69 | Ht 66.5 in | Wt 208.4 lb

## 2017-05-12 DIAGNOSIS — Z5181 Encounter for therapeutic drug level monitoring: Secondary | ICD-10-CM

## 2017-05-12 DIAGNOSIS — E782 Mixed hyperlipidemia: Secondary | ICD-10-CM | POA: Diagnosis not present

## 2017-05-12 DIAGNOSIS — I481 Persistent atrial fibrillation: Secondary | ICD-10-CM | POA: Diagnosis not present

## 2017-05-12 DIAGNOSIS — I4819 Other persistent atrial fibrillation: Secondary | ICD-10-CM

## 2017-05-12 DIAGNOSIS — Z9289 Personal history of other medical treatment: Secondary | ICD-10-CM | POA: Diagnosis not present

## 2017-05-12 DIAGNOSIS — Z7901 Long term (current) use of anticoagulants: Secondary | ICD-10-CM

## 2017-05-12 MED ORDER — APIXABAN 5 MG PO TABS
5.0000 mg | ORAL_TABLET | Freq: Two times a day (BID) | ORAL | 0 refills | Status: DC
Start: 2017-05-12 — End: 2017-07-02

## 2017-05-12 NOTE — Telephone Encounter (Signed)
Pre-cert Verification for the following procedure    ECHO -scheduled for 05-13-17 at [facility]

## 2017-05-12 NOTE — Progress Notes (Signed)
SUBJECTIVE: The patient presents for follow-up of atrial fibrillation. She was evaluated in the ED on 05/10/17 for tachycardia, palpitations, and paroxysmal rapid atrial fibrillation. She takes metoprolol 50 g twice daily. I personally reviewed all labs, studies, and relevant documentation.  I personally reviewed the ECG performed at 9:24 which demonstrated atrial flutter, 105 bpm.  Basic metabolic panel, CBC, TSH, and free T4 were all unremarkable.  Chest x-ray showed by basilar scarring and atelectasis.  ECG performed in the office today which I ordered and personally interpreted demonstrates normal sinus rhythm with no ischemic ST segment or T-wave abnormalities, nor any arrhythmias.  She noticed her heart rate at home prior to going to the ED was between 130 537 bpm. She was talking on the phone when this occurred. She believes the entire episode lasted about 30 hours. She said she has only had occasional palpitations since I last saw her in September 2017.  We again had a discussion about being on a therapeutic dose of Eliquis.  In about another month she will be coming off of aspirin and niacin.    Review of Systems: As per "subjective", otherwise negative.  Allergies  Allergen Reactions  . Penicillins Rash    Has patient had a PCN reaction causing immediate rash, facial/tongue/throat swelling, SOB or lightheadedness with hypotension: Yes Has patient had a PCN reaction causing severe rash involving mucus membranes or skin necrosis: Yes Has patient had a PCN reaction that required hospitalization: No Has patient had a PCN reaction occurring within the last 10 years: No If all of the above answers are "NO", then may proceed with Cephalosporin use.     Current Outpatient Prescriptions  Medication Sig Dispense Refill  . ADVAIR DISKUS 250-50 MCG/DOSE AEPB Inhale 1 puff into the lungs 2 (two) times daily. 60 each 12  . albuterol (PROAIR HFA) 108 (90 Base) MCG/ACT inhaler  Inhale 2 puffs into the lungs every 6 (six) hours as needed for wheezing or shortness of breath. 1 Inhaler 11  . apixaban (ELIQUIS) 2.5 MG TABS tablet Take 1 tablet (2.5 mg total) by mouth 2 (two) times daily. 60 tablet 11  . aspirin 81 MG tablet Take 81 mg by mouth daily.    . Cholecalciferol (VITAMIN D3) 5000 UNITS CAPS Take 1 capsule by mouth daily.    Marland Kitchen CRANBERRY PO Take 1 capsule by mouth daily.    Marland Kitchen guaiFENesin (MUCINEX) 600 MG 12 hr tablet Take 600 mg by mouth every morning.     . meloxicam (MOBIC) 7.5 MG tablet Take 1 tablet (7.5 mg total) by mouth daily. 90 tablet 3  . metoprolol tartrate (LOPRESSOR) 50 MG tablet Take 1 tablet (50 mg total) by mouth 2 (two) times daily. 180 tablet 3  . montelukast (SINGULAIR) 10 MG tablet Take 1 tablet (10 mg total) by mouth at bedtime. (Patient taking differently: Take 10 mg by mouth every morning. ) 90 tablet 3  . ranitidine (ZANTAC) 150 MG tablet Take 1 tablet (150 mg total) by mouth as needed. (Patient taking differently: Take 150 mg by mouth daily as needed for heartburn. ) 90 tablet 3  . tiotropium (SPIRIVA) 18 MCG inhalation capsule Place 1 capsule (18 mcg total) into inhaler and inhale daily. 30 capsule 11   No current facility-administered medications for this visit.     Past Medical History:  Diagnosis Date  . COPD (chronic obstructive pulmonary disease) ([place])   . Hypercholesteremia   . Mitral valve prolapse   .  Paroxysmal atrial fibrillation [facility])     Past Surgical History:  Procedure Laterality Date  . ABDOMINAL HYSTERECTOMY    . CESAREAN SECTION    . TUBAL LIGATION      Social History   Social History  . Marital status: Married    Spouse name: N/A  . Number of children: 1  . Years of education: N/A   Occupational History  . [employer] Pat[employer]    Social History Main Topics  . Smoking status: Former Smoker    Packs/day: 1.00    Years: 20.00    Types: Cigarettes    Quit date: 08/14/1990  . Smokeless tobacco: Never Used  .  Alcohol use No  . Drug use: No  . Sexual activity: Not on file   Other Topics Concern  . Not on file   Social History Narrative  . No narrative on file     Vitals:   05/12/17 0804  BP: 121/77  Pulse: 69  Weight: 208 lb 6.4 oz (94.5 kg)  Height: 5' 6.5" (1.689 m)    Wt Readings from Last 3 Encounters:  05/12/17 208 lb 6.4 oz (94.5 kg)  05/10/17 214 lb (97.1 kg)  04/04/17 208 lb (94.3 kg)     PHYSICAL EXAM General: NAD HEENT: Normal. Neck: No JVD, no thyromegaly. Lungs: Clear to auscultation bilaterally with normal respiratory effort. CV: Nondisplaced PMI.  Regular rate and rhythm, normal S1/S2, no S3/S4, no murmur. No pretibial or periankle edema.  No carotid bruit.   Abdomen: Soft, nontender, no distention.  Neurologic: Alert and oriented.  Psych: Normal affect. Skin: Normal. Musculoskeletal: No gross deformities.    ECG: Most recent ECG reviewed.   Labs: Lab Results  Component Value Date/Time   K 4.0 05/10/2017 08:15 AM   BUN 5 (L) 05/10/2017 08:15 AM   BUN 17 04/01/2017 10:02 AM   CREATININE 0.65 05/10/2017 08:15 AM   ALT 15 04/01/2017 10:02 AM   TSH 1.569 05/10/2017 09:20 AM   TSH 2.100 04/01/2017 10:02 AM   HGB 13.8 05/10/2017 08:15 AM   HGB 12.6 04/01/2017 10:02 AM     Lipids: Lab Results  Component Value Date/Time   LDLCALC 119 (H) 04/01/2017 10:02 AM   CHOL 196 04/01/2017 10:02 AM   TRIG 161 (H) 04/01/2017 10:02 AM   HDL 45 04/01/2017 10:02 AM       ASSESSMENT AND PLAN: 1. Persistent atrial fibrillation and flutter: I will obtain an echocardiogram to evaluate cardiac structure and function and left atrial size in particular. I will continue metoprolol tartrate 50 mg twice daily. I've instructed her she can take an extra 25 mg of metoprolol should she develop tachycardia and palpitations again. I instructed her on how to perform Valsalva maneuvers. I had a long discussion with her about being on a therapeutic dose of Eliquis. This will be  increased to 5 mg twice daily.    Disposition: Follow up 2 months.  Time spent: 40 minutes, of which greater than 50% was spent reviewing symptoms, relevant blood tests and studies, and discussing management plan with the patient.   Kate Sable, M.D., F.A.C.C.

## 2017-05-12 NOTE — Patient Instructions (Signed)
Medication Instructions:  Your physician has recommended you make the following change in your medication:   Begin taking Eliquis 5 mg twice daily  Labwork: NONE  Testing/Procedures: Your physician has requested that you have an echocardiogram. Echocardiography is a painless test that uses sound waves to create images of your heart. It provides your doctor with information about the size and shape of your heart and how well your heart's chambers and valves are working. This procedure takes approximately one hour. There are no restrictions for this procedure.  Follow-Up: Your physician recommends that you schedule a follow-up appointment in: 2 [address] Ing   Any Other Special Instructions Will Be Listed Below (If Applicable).  If you need a refill on your cardiac medications before your next appointment, please call your pharmacy.

## 2017-05-13 ENCOUNTER — Ambulatory Visit (INDEPENDENT_AMBULATORY_CARE_PROVIDER_SITE_OTHER): Payer: Medicare HMO

## 2017-05-13 ENCOUNTER — Other Ambulatory Visit: Payer: Self-pay

## 2017-05-13 DIAGNOSIS — I481 Persistent atrial fibrillation: Secondary | ICD-10-CM | POA: Diagnosis not present

## 2017-05-13 DIAGNOSIS — I4819 Other persistent atrial fibrillation: Secondary | ICD-10-CM

## 2017-05-15 ENCOUNTER — Telehealth: Payer: Self-pay | Admitting: *Deleted

## 2017-05-15 ENCOUNTER — Encounter: Payer: Self-pay | Admitting: Physician Assistant

## 2017-05-15 NOTE — Telephone Encounter (Signed)
Notes recorded by Laurine Blazer, LPN on 05/02/5283 at 1:32 AM EDT Patient notified. Copy to pmd. Follow up already scheduled for 07/28/2017. ------  Notes recorded by Laurine Blazer, LPN on 4/40/1027 at 2:53 PM EDT Left message to return call.  ------  Notes recorded by Herminio Commons, MD on 05/13/2017 at 4:42 PM EDT Normal cardiac function.

## 2017-05-19 MED ORDER — ICOSAPENT ETHYL 1 G PO CAPS: 2.0000 g | ORAL_CAPSULE | Freq: Two times a day (BID) | ORAL | 5 refills | Status: DC

## 2017-05-27 ENCOUNTER — Ambulatory Visit (INDEPENDENT_AMBULATORY_CARE_PROVIDER_SITE_OTHER): Payer: Medicare HMO | Admitting: *Deleted

## 2017-05-27 VITALS — BP 129/66 | HR 59 | Temp 97.0°F | Ht 66.5 in | Wt 209.0 lb

## 2017-05-27 DIAGNOSIS — Z0000 Encounter for general adult medical examination without abnormal findings: Secondary | ICD-10-CM | POA: Diagnosis not present

## 2017-05-27 DIAGNOSIS — Z1212 Encounter for screening for malignant neoplasm of rectum: Secondary | ICD-10-CM

## 2017-05-27 NOTE — Progress Notes (Signed)
Subjective:   [name] is a 66 y.o. female who presents for an Initial Medicare Annual Wellness Visit.Patient has no complaints at this time.She feels her health is the same as it was this time last year. She does report an ER visit on 05/10/17 for Paroxysmal Atrial Fibrillation. She was not hospitalized for the episode. She followed up with her cardiologist Dr Jacinta Shoe 2 days later and an echocardigram was performed and she was instructed to take and an extra 25mg  of metoprolol if she developed tachycardia or palpitations again. Her eliquis was increase to 5mg  BID.She has been feeling well. She works PT at [employer] in [place]. She is retired from [employer] after 42 years of service. She has been married for 103 years and lives at home with her husband. They have one son and 2 grandchildren. She is actively involved in her church serving on several committees.      Objective:    Today's Vitals   05/27/17 0911  BP: 129/66  Pulse: (!) 59  Temp: (!) 97 F (36.1 C)  TempSrc: Oral  Weight: 209 lb (94.8 kg)  Height: 5' 6.5" (1.689 m)  PainSc: 0-No pain   Body mass index is 33.23 kg/m.   Current Medications (verified) Outpatient Encounter Prescriptions as of 05/27/2017  Medication Sig  . albuterol (PROAIR HFA) 108 (90 Base) MCG/ACT inhaler Inhale 2 puffs into the lungs every 6 (six) hours as needed for wheezing or shortness of breath.  Marland Kitchen apixaban (ELIQUIS) 5 MG TABS tablet Take 1 tablet (5 mg total) by mouth 2 (two) times daily.  . Cholecalciferol (VITAMIN D3) 5000 UNITS CAPS Take 1 capsule by mouth daily.  Marland Kitchen CRANBERRY PO Take 1 capsule by mouth daily.  Marland Kitchen guaiFENesin (MUCINEX) 600 MG 12 hr tablet Take 600 mg by mouth every morning.   Vanessa Kick Ethyl (VASCEPA) 1 g CAPS Take 2 g by mouth 2 (two) times daily.  . meloxicam (MOBIC) 7.5 MG tablet Take 1 tablet (7.5 mg total) by mouth daily.  . metoprolol tartrate (LOPRESSOR) 50 MG tablet Take 1 tablet (50 mg total) by mouth 2  (two) times daily.  . montelukast (SINGULAIR) 10 MG tablet Take 1 tablet (10 mg total) by mouth at bedtime. (Patient taking differently: Take 10 mg by mouth every morning. )  . ranitidine (ZANTAC) 150 MG tablet Take 1 tablet (150 mg total) by mouth as needed. (Patient taking differently: Take 150 mg by mouth daily as needed for heartburn. )  . tiotropium (SPIRIVA) 18 MCG inhalation capsule Place 1 capsule (18 mcg total) into inhaler and inhale daily.  Marland Kitchen ADVAIR DISKUS 250-50 MCG/DOSE AEPB Inhale 1 puff into the lungs 2 (two) times daily.  . [DISCONTINUED] aspirin 81 MG tablet Take 81 mg by mouth daily.   No facility-administered encounter medications on file as of 05/27/2017.     Allergies (verified) Penicillins   History: Past Medical History:  Diagnosis Date  . COPD (chronic obstructive pulmonary disease) ([place])   . Hypercholesteremia   . Mitral valve prolapse   . Paroxysmal atrial fibrillation [facility])    Past Surgical History:  Procedure Laterality Date  . ABDOMINAL HYSTERECTOMY    . CESAREAN SECTION    . TUBAL LIGATION     Family History  Problem Relation Age of Onset  . Heart attack Mother 63  . Asthma Mother   . Emphysema Father        smoked  . Atrial fibrillation Brother   . Hypertension Son  Social History   Occupational History  . [employer] Patroller    Social History Main Topics  . Smoking status: Former Smoker    Packs/day: 1.00    Years: 20.00    Types: Cigarettes    Quit date: 08/14/1990  . Smokeless tobacco: Never Used  . Alcohol use No  . Drug use: No  . Sexual activity: Not Currently    Tobacco Counseling Counseling given: Not Answered   Activities of Daily Living In your present state of health, do you have any difficulty performing the following activities: 05/27/2017  Hearing? Y  Vision? Y  Difficulty concentrating or making decisions? N  Walking or climbing stairs? Y  Dressing or bathing? N  Doing errands, shopping? N  Some recent data might  be hidden    Immunizations and Health Maintenance Immunization History  Administered Date(s) Administered  . Influenza Split 08/03/2013  . Influenza,inj,Quad PF,36+ Mos 08/26/2016  . Pneumococcal Conjugate-13 11/04/2007   Health Maintenance Due  Topic Date Due  . Hepatitis C Screening  11-10-1950  . DEXA SCAN  12/10/2013  . PNA vac Low Risk Adult (2 of 2 - PPSV23) 12/10/2013    Patient Care Team: Theodoro Clock as PCP - General (Physician Assistant) Tanda Rockers, MD as Consulting Physician (Pulmonary Disease) Herminio Commons, MD as Attending Physician (Cardiology)  Indicate any recent Medical Services you may have received from other than [facility] providers in the past year (date may be approximate).     Assessment:   This is a routine wellness examination for [name].   Hearing/Vision screen Pt reports she has some mild hearing loss from working at [employer] for 42 years. She wears glasses and will call [facility] for an appointment soon.   Dietary issues and exercise activities discussed: Current Exercise Habits: The patient does not participate in regular exercise at present, Exercise limited by: respiratory conditions(s) (COPD SOB )  Goals    None     Depression Screen PHQ 2/9 Scores 05/27/2017 04/04/2017 04/01/2017  PHQ - 2 Score 0 0 0    Fall Risk Fall Risk  05/27/2017 04/04/2017 04/01/2017  Falls in the past year? No No No    Cognitive Function: MMSE - Mini Mental State Exam 05/27/2017  Orientation to time 5  Orientation to Place 5  Registration 3  Attention/ Calculation 5  Recall 3  Language- name 2 objects 2  Language- repeat 1  Language- follow 3 step command 3  Language- read & follow direction 1  Write a sentence 1  Copy design 1  Total score 30        Screening Tests Health Maintenance  Topic Date Due  . Hepatitis C Screening  09/16/1951  . DEXA SCAN  12/10/2013  . PNA vac Low Risk Adult (2 of 2 - PPSV23) 12/10/2013  . TETANUS/TDAP   09/22/2017 (Originally 12/11/1967)  . INFLUENZA VACCINE  06/03/2017  . MAMMOGRAM  08/26/2018  . COLONOSCOPY  10/02/2019      Plan:   Continue all medications and diet as previously prescribed. FOBT given and explained to patient. Discussed the shingrix vaccine and price is 22.50 today and patient will wait to see if it comes down even more. Appointment made with Particia Nearing PA for 6 month follow up on  11/20 and a mammogram is also scheduled for the mobile unit on 11/20. Will follow up with patient and schedule a dexascan if needed.   I have personally reviewed and noted the following  in the patient's chart:   . Medical and social history . Use of alcohol, tobacco or illicit drugs  . Current medications and supplements . Functional ability and status . Nutritional status . Physical activity . Advanced directives . List of other physicians . Hospitalizations, surgeries, and ER visits in previous 12 months . Vitals . Screenings to include cognitive, depression, and falls . Referrals and appointments  In addition, I have reviewed and discussed with patient certain preventive protocols, quality metrics, and best practice recommendations. A written personalized care plan for preventive services as well as general preventive health recommendations were provided to patient.     Torrie Mayers, RN   05/27/2017    I have reviewed and agree with the above AWV documentation.   Terald Sleeper PA-C [facility] [address]  [place], [place] [address] [phone]

## 2017-05-27 NOTE — Patient Instructions (Signed)
  [name] , Thank you for taking time to come for your Medicare Wellness Visit. I appreciate your ongoing commitment to your health goals. Please review the following plan we discussed and let me know if I can assist you in the future.   These are the goals we discussed: Goals    None      This is a list of the screening recommended for you and due dates:  Health Maintenance  Topic Date Due  .  Hepatitis C: One time screening is recommended by Center for Disease Control  (CDC) for  adults born from [birth year] through 1967.   [birth year]  . Tetanus Vaccine  12/11/1967  . Colon Cancer Screening  12/10/1998  . DEXA scan (bone density measurement)  12/10/2013  . Pneumonia vaccines (2 of 2 - PPSV23) 12/10/2013  . Flu Shot  06/03/2017  . Mammogram  08/26/2018

## 2017-06-02 ENCOUNTER — Other Ambulatory Visit: Payer: Medicare HMO

## 2017-06-02 DIAGNOSIS — Z1212 Encounter for screening for malignant neoplasm of rectum: Secondary | ICD-10-CM

## 2017-06-04 LAB — FECAL OCCULT BLOOD, IMMUNOCHEMICAL: Fecal Occult Bld: POSITIVE — AB

## 2017-06-30 ENCOUNTER — Other Ambulatory Visit: Payer: Self-pay | Admitting: Physician Assistant

## 2017-07-02 ENCOUNTER — Other Ambulatory Visit: Payer: Self-pay | Admitting: Cardiovascular Disease

## 2017-07-02 ENCOUNTER — Ambulatory Visit (INDEPENDENT_AMBULATORY_CARE_PROVIDER_SITE_OTHER): Payer: Medicare HMO | Admitting: Family Medicine

## 2017-07-02 ENCOUNTER — Encounter: Payer: Self-pay | Admitting: Family Medicine

## 2017-07-02 VITALS — BP 105/77 | HR 120 | Temp 98.8°F | Ht 66.5 in | Wt 208.0 lb

## 2017-07-02 DIAGNOSIS — J441 Chronic obstructive pulmonary disease with (acute) exacerbation: Secondary | ICD-10-CM

## 2017-07-02 DIAGNOSIS — N3001 Acute cystitis with hematuria: Secondary | ICD-10-CM | POA: Diagnosis not present

## 2017-07-02 DIAGNOSIS — R109 Unspecified abdominal pain: Secondary | ICD-10-CM | POA: Diagnosis not present

## 2017-07-02 LAB — URINALYSIS
BILIRUBIN UA: NEGATIVE
Glucose, UA: NEGATIVE
NITRITE UA: NEGATIVE
PH UA: 5.5 (ref 5.0–7.5)
UUROB: 0.2 mg/dL (ref 0.2–1.0)

## 2017-07-02 MED ORDER — DOXYCYCLINE HYCLATE 100 MG PO TABS: 100.0000 mg | ORAL_TABLET | Freq: Two times a day (BID) | ORAL | 0 refills | Status: DC

## 2017-07-02 MED ORDER — GUAIFENESIN-CODEINE 100-10 MG/5ML PO SOLN: 5.0000 mL | Freq: Three times a day (TID) | ORAL | 0 refills | Status: DC | PRN

## 2017-07-02 NOTE — Patient Instructions (Signed)
Great to meet you!  Be sure to finish all antibiotics   Urinary Tract Infection, Adult A urinary tract infection (UTI) is an infection of any part of the urinary tract, which includes the kidneys, ureters, bladder, and urethra. These organs make, store, and get rid of urine in the body. UTI can be a bladder infection (cystitis) or kidney infection (pyelonephritis). What are the causes? This infection may be caused by fungi, viruses, or bacteria. Bacteria are the most common cause of UTIs. This condition can also be caused by repeated incomplete emptying of the bladder during urination. What increases the risk? This condition is more likely to develop if:  You ignore your need to urinate or hold urine for long periods of time.  You do not empty your bladder completely during urination.  You wipe back to front after urinating or having a bowel movement, if you are female.  You are uncircumcised, if you are female.  You are constipated.  You have a urinary catheter that stays in place (indwelling).  You have a weak defense (immune) system.  You have a medical condition that affects your bowels, kidneys, or bladder.  You have diabetes.  You take antibiotic medicines frequently or for long periods of time, and the antibiotics no longer work well against certain types of infections (antibiotic resistance).  You take medicines that irritate your urinary tract.  You are exposed to chemicals that irritate your urinary tract.  You are female.  What are the signs or symptoms? Symptoms of this condition include:  Fever.  Frequent urination or passing small amounts of urine frequently.  Needing to urinate urgently.  Pain or burning with urination.  Urine that smells bad or unusual.  Cloudy urine.  Pain in the lower abdomen or back.  Trouble urinating.  Blood in the urine.  Vomiting or being less hungry than normal.  Diarrhea or abdominal pain.  Vaginal discharge, if  you are female.  How is this diagnosed? This condition is diagnosed with a medical history and physical exam. You will also need to provide a urine sample to test your urine. Other tests may be done, including:  Blood tests.  Sexually transmitted disease (STD) testing.  If you have had more than one UTI, a cystoscopy or imaging studies may be done to determine the cause of the infections. How is this treated? Treatment for this condition often includes a combination of two or more of the following:  Antibiotic medicine.  Other medicines to treat less common causes of UTI.  Over-the-counter medicines to treat pain.  Drinking enough water to stay hydrated.  Follow these instructions at home:  Take over-the-counter and prescription medicines only as told by your health care provider.  If you were prescribed an antibiotic, take it as told by your health care provider. Do not stop taking the antibiotic even if you start to feel better.  Avoid alcohol, caffeine, tea, and carbonated beverages. They can irritate your bladder.  Drink enough fluid to keep your urine clear or pale yellow.  Keep all follow-up visits as told by your health care provider. This is important.  Make sure to: ? Empty your bladder often and completely. Do not hold urine for long periods of time. ? Empty your bladder before and after sex. ? Wipe from front to back after a bowel movement if you are female. Use each tissue one time when you wipe. Contact a health care provider if:  You have back pain.  You have  a fever.  You feel nauseous or vomit.  Your symptoms do not get better after 3 days.  Your symptoms go away and then return. Get help right away if:  You have severe back pain or lower abdominal pain.  You are vomiting and cannot keep down any medicines or water. This information is not intended to replace advice given to you by your health care provider. Make sure you discuss any questions you  have with your health care provider. Document Released: 07/30/2005 Document Revised: 04/02/2016 Document Reviewed: 09/10/2015 Elsevier Interactive Patient Education  2017 [employer].

## 2017-07-02 NOTE — Progress Notes (Signed)
   HPI  Patient presents today he reviewed to symptoms.  Patient states that she's had a few days of low back pain, abdominal pressure, and dark-colored urine. She denies dysuria, blood in the urine, or foul odor to the urine. She has had many UTIs and this is characteristic of her previous UTIs.  Cough Patient has COPD, she states that over the last week or so she's had increased cough and increased sputum. She describes an episode of chills characteristic of previous fevers about 2 nights ago.   She requests cough syrup as well  PMH: Smoking status noted ROS: Per HPI  Objective: BP 105/77   Pulse (!) 120   Temp 98.8 F (37.1 C) (Oral)   Ht 5' 6.5" (1.689 m)   Wt 208 lb (94.3 kg)   BMI 33.07 kg/m  Gen: NAD, alert, cooperative with exam HEENT: NCAT CV: RRR, good S1/S2, no murmur Resp: CTABL, no wheezes, non-labored Abd: SNTND, BS present, no guarding or organomegaly, no suprapubic tenderness Ext: No edema, warm Neuro: Alert and oriented, No gross deficits  Assessment and plan:  # COPD exacerbation Issue with increased cough and increased sputum for about one week Treatment doxycycline Cough syrup with codeine also given  # UTI Patient with urinalysis consistent with UTI. Doxycycline should be adequate coverage based on previous urine culture and usual coverage. Return to clinic with any concerns  Patient's pulses noted to be 120 above, on my exam her pulse had normalized significantly, although I did not recheck her pulse she was rate controlled by auscultation   Orders Placed This Encounter  Procedures  . Urine Culture  . Urinalysis    Meds ordered this encounter  Medications  . doxycycline (VIBRA-TABS) 100 MG tablet    Sig: Take 1 tablet (100 mg total) by mouth 2 (two) times daily. 1 po bid    Dispense:  20 tablet    Refill:  0  . guaiFENesin-codeine 100-10 MG/5ML syrup    Sig: Take 5-10 mLs by mouth 3 (three) times daily as needed for cough.   Dispense:  180 mL    Refill:  0    Laroy Apple, MD [place] 07/02/2017, 6:53 PM

## 2017-07-05 LAB — URINE CULTURE

## 2017-07-23 ENCOUNTER — Ambulatory Visit: Payer: Medicare HMO | Admitting: Cardiovascular Disease

## 2017-07-28 ENCOUNTER — Encounter: Payer: Self-pay | Admitting: Cardiovascular Disease

## 2017-07-28 ENCOUNTER — Ambulatory Visit (INDEPENDENT_AMBULATORY_CARE_PROVIDER_SITE_OTHER): Payer: Medicare HMO | Admitting: Cardiovascular Disease

## 2017-07-28 ENCOUNTER — Other Ambulatory Visit: Payer: Self-pay | Admitting: Physician Assistant

## 2017-07-28 ENCOUNTER — Encounter: Payer: Self-pay | Admitting: Physician Assistant

## 2017-07-28 VITALS — BP 140/82 | HR 60 | Ht 66.0 in | Wt 211.0 lb

## 2017-07-28 DIAGNOSIS — I4819 Other persistent atrial fibrillation: Secondary | ICD-10-CM

## 2017-07-28 DIAGNOSIS — Z7901 Long term (current) use of anticoagulants: Secondary | ICD-10-CM

## 2017-07-28 DIAGNOSIS — Z5181 Encounter for therapeutic drug level monitoring: Secondary | ICD-10-CM | POA: Diagnosis not present

## 2017-07-28 DIAGNOSIS — I481 Persistent atrial fibrillation: Secondary | ICD-10-CM

## 2017-07-28 MED ORDER — DOXYCYCLINE HYCLATE 100 MG PO TABS: 100.0000 mg | ORAL_TABLET | Freq: Two times a day (BID) | ORAL | 0 refills | Status: DC

## 2017-07-28 NOTE — Patient Instructions (Signed)

## 2017-07-28 NOTE — Progress Notes (Signed)
SUBJECTIVE: The patient presents for follow-up of persistent atrial fibrillation and atrial flutter.  Echocardiogram 05/13/17: Normal left ventricular systolic and diastolic function, LVEF 41-74%, mild mitral regurgitation.  She has had some palpitations but has not had to take extra metoprolol.  Valsalva maneuvers have been employed and have helped.  I talked to her about potential atrial fibrillation ablation but she is not interested at this time.  She denies chest pain, shortness of breath, and bleeding problems with Eliquis. She does have occasional hot flashes.   Review of Systems: As per "subjective", otherwise negative.  Allergies  Allergen Reactions  . Penicillins Rash    Has patient had a PCN reaction causing immediate rash, facial/tongue/throat swelling, SOB or lightheadedness with hypotension: Yes Has patient had a PCN reaction causing severe rash involving mucus membranes or skin necrosis: Yes Has patient had a PCN reaction that required hospitalization: No Has patient had a PCN reaction occurring within the last 10 years: No If all of the above answers are "NO", then may proceed with Cephalosporin use.     Current Outpatient Prescriptions  Medication Sig Dispense Refill  . albuterol (PROAIR HFA) 108 (90 Base) MCG/ACT inhaler Inhale 2 puffs into the lungs every 6 (six) hours as needed for wheezing or shortness of breath. 1 Inhaler 11  . Cholecalciferol (VITAMIN D3) 5000 UNITS CAPS Take 1 capsule by mouth daily.    Marland Kitchen CRANBERRY PO Take 1 capsule by mouth daily.    Marland Kitchen doxycycline (VIBRA-TABS) 100 MG tablet Take 1 tablet (100 mg total) by mouth 2 (two) times daily. 1 po bid 20 tablet 0  . ELIQUIS 5 MG TABS tablet TAKE  (1)  TABLET TWICE A DAY. 60 tablet 3  . Fluticasone-Salmeterol (ADVAIR) 250-50 MCG/DOSE AEPB Inhale 1 puff into the lungs 2 (two) times daily.    Marland Kitchen guaiFENesin (MUCINEX) 600 MG 12 hr tablet Take 600 mg by mouth every morning.     Marland Kitchen guaiFENesin-codeine  100-10 MG/5ML syrup Take 5-10 mLs by mouth 3 (three) times daily as needed for cough. 180 mL 0  . Icosapent Ethyl (VASCEPA) 1 g CAPS Take 2 g by mouth 2 (two) times daily. 120 capsule 5  . meloxicam (MOBIC) 7.5 MG tablet Take 1 tablet (7.5 mg total) by mouth daily. 90 tablet 3  . metoprolol tartrate (LOPRESSOR) 50 MG tablet Take 1 tablet (50 mg total) by mouth 2 (two) times daily. 180 tablet 3  . montelukast (SINGULAIR) 10 MG tablet Take 1 tablet (10 mg total) by mouth at bedtime. (Patient taking differently: Take 10 mg by mouth every morning. ) 90 tablet 3  . ranitidine (ZANTAC) 150 MG tablet Take 1 tablet (150 mg total) by mouth as needed. (Patient taking differently: Take 150 mg by mouth daily as needed for heartburn. ) 90 tablet 3  . tiotropium (SPIRIVA) 18 MCG inhalation capsule Place 1 capsule (18 mcg total) into inhaler and inhale daily. 30 capsule 11   No current facility-administered medications for this visit.     Past Medical History:  Diagnosis Date  . COPD (chronic obstructive pulmonary disease) ([place])   . Hypercholesteremia   . Mitral valve prolapse   . Paroxysmal atrial fibrillation [facility])     Past Surgical History:  Procedure Laterality Date  . ABDOMINAL HYSTERECTOMY    . CESAREAN SECTION    . TUBAL LIGATION      Social History   Social History  . Marital status: Married    Spouse name:  N/A  . Number of children: 1  . Years of education: N/A   Occupational History  . [employer]    Social History Main Topics  . Smoking status: Former Smoker    Packs/day: 1.00    Years: 20.00    Types: Cigarettes    Quit date: 08/14/1990  . Smokeless tobacco: Never Used  . Alcohol use No  . Drug use: No  . Sexual activity: Not Currently   Other Topics Concern  . Not on file   Social History Narrative  . No narrative on file     Vitals:   07/28/17 1135  BP: 140/82  Pulse: 60  SpO2: 93%  Weight: 211 lb (95.7 kg)  Height: 5\' 6"  (1.676 m)    Wt Readings from  Last 3 Encounters:  07/28/17 211 lb (95.7 kg)  07/02/17 208 lb (94.3 kg)  05/27/17 209 lb (94.8 kg)     PHYSICAL EXAM General: NAD HEENT: Normal. Neck: No JVD, no thyromegaly. Lungs: Clear to auscultation bilaterally with normal respiratory effort. CV: Nondisplaced PMI.  Regular rate and rhythm, normal S1/S2, no S3/S4, no murmur. No pretibial or periankle edema.  No carotid bruit.   Abdomen: Soft, nontender, no distention.  Neurologic: Alert and oriented.  Psych: Normal affect. Skin: Normal. Musculoskeletal: No gross deformities.    ECG: Most recent ECG reviewed.   Labs: Lab Results  Component Value Date/Time   K 4.0 05/10/2017 08:15 AM   BUN 5 (L) 05/10/2017 08:15 AM   BUN 17 04/01/2017 10:02 AM   CREATININE 0.65 05/10/2017 08:15 AM   ALT 15 04/01/2017 10:02 AM   TSH 1.569 05/10/2017 09:20 AM   TSH 2.100 04/01/2017 10:02 AM   HGB 13.8 05/10/2017 08:15 AM   HGB 12.6 04/01/2017 10:02 AM     Lipids: Lab Results  Component Value Date/Time   LDLCALC 119 (H) 04/01/2017 10:02 AM   CHOL 196 04/01/2017 10:02 AM   TRIG 161 (H) 04/01/2017 10:02 AM   HDL 45 04/01/2017 10:02 AM       ASSESSMENT AND PLAN:  1. Persistent atrial fibrillation and flutter: Left atrial size is normal. Continue metoprolol tartrate 50 mg twice daily. I talked to her about potential atrial fibrillation ablation but she is not interested at this time. I previously instructed her she can take an extra 25 mg of metoprolol should she develop tachycardia and palpitations again. I also instructed her on how to perform Valsalva maneuvers. Continue Eliquis 5 mg BID.     Disposition: Follow up 6 months.   Kate Sable, M.D., F.A.C.C.

## 2017-08-18 ENCOUNTER — Encounter: Payer: Self-pay | Admitting: Physician Assistant

## 2017-08-18 ENCOUNTER — Ambulatory Visit (INDEPENDENT_AMBULATORY_CARE_PROVIDER_SITE_OTHER): Payer: Medicare HMO | Admitting: Physician Assistant

## 2017-08-18 VITALS — BP 101/70 | HR 84 | Temp 97.4°F | Ht 66.0 in | Wt 212.8 lb

## 2017-08-18 DIAGNOSIS — J449 Chronic obstructive pulmonary disease, unspecified: Secondary | ICD-10-CM | POA: Diagnosis not present

## 2017-08-18 DIAGNOSIS — M461 Sacroiliitis, not elsewhere classified: Secondary | ICD-10-CM | POA: Diagnosis not present

## 2017-08-18 DIAGNOSIS — J40 Bronchitis, not specified as acute or chronic: Secondary | ICD-10-CM | POA: Diagnosis not present

## 2017-08-18 MED ORDER — PREDNISONE 10 MG PO TABS: 10.0000 mg | ORAL_TABLET | Freq: Every day | ORAL | 0 refills | Status: DC

## 2017-08-18 MED ORDER — HYDROCODONE-HOMATROPINE 5-1.5 MG/5ML PO SYRP
5.0000 mL | ORAL_SOLUTION | Freq: Four times a day (QID) | ORAL | 0 refills | Status: DC | PRN
Start: 2017-08-18 — End: 2017-11-04

## 2017-08-18 MED ORDER — DOXYCYCLINE HYCLATE 100 MG PO TABS: 100.0000 mg | ORAL_TABLET | Freq: Two times a day (BID) | ORAL | 1 refills | Status: DC

## 2017-08-18 NOTE — Progress Notes (Signed)
BP 101/70   Pulse 84   Temp (!) 97.4 F (36.3 C) (Oral)   Ht 5\' 6"  (1.676 m)   Wt 212 lb 12.8 oz (96.5 kg)   BMI 34.35 kg/m    Subjective:    Patient ID: [name], female    DOB: 1951/10/11, 66 y.o.   MRN: [id number]  HPI: [name] is a 66 y.o. female presenting on 08/18/2017 for Back Pain and Cough  Patient with several days of progressing upper respiratory and bronchial symptoms. Initially there was more upper respiratory congestion. This progressed to having significant cough that is productive throughout the day and severe at night. There is occasional wheezing after coughing. Sometimes there is slight dyspnea on exertion. It is productive mucus that is yellow in color. Denies any blood.  She has had increasing pain in the right SI joint. It is made worse after she is still on today. 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 She's tried over-the-counter medications without any relief.  Relevant past medical, surgical, family and social history reviewed and updated as indicated. Allergies and medications reviewed and updated.  Past Medical History:  Diagnosis Date  . COPD (chronic obstructive pulmonary disease) ([place])   . Hypercholesteremia   . Mitral valve prolapse   . Paroxysmal atrial fibrillation [facility])     Past Surgical History:  Procedure Laterality Date  . ABDOMINAL HYSTERECTOMY    . CESAREAN SECTION    . TUBAL LIGATION      Review of Systems  Constitutional: Positive for chills and fatigue. Negative for activity change and appetite change.  HENT: Positive for congestion, postnasal drip and sore throat.   Eyes: Negative.   Respiratory: Positive for cough and wheezing.   Cardiovascular: Negative.  Negative for chest pain, palpitations and leg swelling.  Gastrointestinal: Negative.   Genitourinary: Negative.   Musculoskeletal: Positive for arthralgias and back pain. Negative for gait problem.  Skin: Negative.     Neurological: Positive for headaches.    Allergies as of 08/18/2017      Reactions   Penicillins Rash   Has patient had a PCN reaction causing immediate rash, facial/tongue/throat swelling, SOB or lightheadedness with hypotension: Yes Has patient had a PCN reaction causing severe rash involving mucus membranes or skin necrosis: Yes Has patient had a PCN reaction that required hospitalization: No Has patient had a PCN reaction occurring within the last 10 years: No If all of the above answers are "NO", then may proceed with Cephalosporin use.      Medication List       Accurate as of 08/18/17  8:56 AM. Always use your most recent med list.          albuterol 108 (90 Base) MCG/ACT inhaler Commonly known as:  PROAIR HFA Inhale 2 puffs into the lungs every 6 (six) hours as needed for wheezing or shortness of breath.   CRANBERRY PO Take 1 capsule by mouth daily.   doxycycline 100 MG tablet Commonly known as:  VIBRA-TABS Take 1 tablet (100 mg total) by mouth 2 (two) times daily.   ELIQUIS 5 MG Tabs tablet Generic drug:  apixaban TAKE  (1)  TABLET TWICE A DAY.   Fluticasone-Salmeterol 250-50 MCG/DOSE Aepb Commonly known as:  ADVAIR Inhale 1 puff into the lungs 2 (two) times daily.   guaiFENesin 600 MG 12 hr tablet Commonly known as:  MUCINEX Take 600 mg by mouth every morning.  HYDROcodone-homatropine 5-1.5 MG/5ML syrup Commonly known as:  HYCODAN Take 5 mLs by mouth every 6 (six) hours as needed for cough.   Icosapent Ethyl 1 g Caps Commonly known as:  VASCEPA Take 2 g by mouth 2 (two) times daily.   meloxicam 7.5 MG tablet Commonly known as:  MOBIC Take 1 tablet (7.5 mg total) by mouth daily.   metoprolol tartrate 50 MG tablet Commonly known as:  LOPRESSOR Take 1 tablet (50 mg total) by mouth 2 (two) times daily.   montelukast 10 MG tablet Commonly known as:  SINGULAIR Take 1 tablet (10 mg total) by mouth at bedtime.   predniSONE 10 MG tablet Commonly  known as:  DELTASONE Take 1 tablet (10 mg total) by mouth daily with breakfast.   ranitidine 150 MG tablet Commonly known as:  ZANTAC Take 1 tablet (150 mg total) by mouth as needed.   tiotropium 18 MCG inhalation capsule Commonly known as:  SPIRIVA Place 1 capsule (18 mcg total) into inhaler and inhale daily.   Vitamin D3 5000 units Caps Take 1 capsule by mouth daily.          Objective:    BP 101/70   Pulse 84   Temp (!) 97.4 F (36.3 C) (Oral)   Ht 5\' 6"  (1.676 m)   Wt 212 lb 12.8 oz (96.5 kg)   BMI 34.35 kg/m   Allergies  Allergen Reactions  . Penicillins Rash    Has patient had a PCN reaction causing immediate rash, facial/tongue/throat swelling, SOB or lightheadedness with hypotension: Yes Has patient had a PCN reaction causing severe rash involving mucus membranes or skin necrosis: Yes Has patient had a PCN reaction that required hospitalization: No Has patient had a PCN reaction occurring within the last 10 years: No If all of the above answers are "NO", then may proceed with Cephalosporin use.     Physical Exam  Constitutional: She is oriented to person, place, and time. She appears well-developed and well-nourished.  HENT:  Head: Normocephalic and atraumatic.  Right Ear: There is drainage and tenderness.  Left Ear: There is drainage and tenderness.  Nose: Mucosal edema and rhinorrhea present. Right sinus exhibits maxillary sinus tenderness and frontal sinus tenderness. Left sinus exhibits maxillary sinus tenderness and frontal sinus tenderness.  Mouth/Throat: Oropharyngeal exudate and posterior oropharyngeal erythema present.  Eyes: Pupils are equal, round, and reactive to light. Conjunctivae and EOM are normal.  Neck: Normal range of motion. Neck supple.  Cardiovascular: Normal rate, regular rhythm, normal heart sounds and intact distal pulses.   Pulmonary/Chest: Effort normal. She has wheezes in the right upper field and the left upper field.  Abdominal:  Soft. Bowel sounds are normal.  Musculoskeletal:       Lumbar back: She exhibits tenderness and pain.       Back:  Neurological: She is alert and oriented to person, place, and time. She has normal reflexes.  Skin: Skin is warm and dry. No rash noted.  Psychiatric: She has a normal mood and affect. Her behavior is normal. Judgment and thought content normal.        Assessment & Plan:   1. Sacroiliitis (HCC) - predniSONE (DELTASONE) 10 MG tablet; Take 1 tablet (10 mg total) by mouth daily with breakfast.  Dispense: 40 tablet; Refill: 0  2. Bronchitis - HYDROcodone-homatropine (HYCODAN) 5-1.5 MG/5ML syrup; Take 5 mLs by mouth every 6 (six) hours as needed for cough.  Dispense: 120 mL; Refill: 0 - doxycycline (VIBRA-TABS) 100 MG tablet;  Take 1 tablet (100 mg total) by mouth 2 (two) times daily.  Dispense: 20 tablet; Refill: 1 - predniSONE (DELTASONE) 10 MG tablet; Take 1 tablet (10 mg total) by mouth daily with breakfast.  Dispense: 40 tablet; Refill: 0  3. COPD  GOLD III    Current Outpatient Prescriptions:  .  albuterol (PROAIR HFA) 108 (90 Base) MCG/ACT inhaler, Inhale 2 puffs into the lungs every 6 (six) hours as needed for wheezing or shortness of breath., Disp: 1 Inhaler, Rfl: 11 .  Cholecalciferol (VITAMIN D3) 5000 UNITS CAPS, Take 1 capsule by mouth daily., Disp: , Rfl:  .  CRANBERRY PO, Take 1 capsule by mouth daily., Disp: , Rfl:  .  ELIQUIS 5 MG TABS tablet, TAKE  (1)  TABLET TWICE A DAY., Disp: 60 tablet, Rfl: 3 .  Fluticasone-Salmeterol (ADVAIR) 250-50 MCG/DOSE AEPB, Inhale 1 puff into the lungs 2 (two) times daily., Disp: , Rfl:  .  guaiFENesin (MUCINEX) 600 MG 12 hr tablet, Take 600 mg by mouth every morning. , Disp: , Rfl:  .  Icosapent Ethyl (VASCEPA) 1 g CAPS, Take 2 g by mouth 2 (two) times daily., Disp: 120 capsule, Rfl: 5 .  meloxicam (MOBIC) 7.5 MG tablet, Take 1 tablet (7.5 mg total) by mouth daily., Disp: 90 tablet, Rfl: 3 .  metoprolol tartrate (LOPRESSOR) 50  MG tablet, Take 1 tablet (50 mg total) by mouth 2 (two) times daily., Disp: 180 tablet, Rfl: 3 .  montelukast (SINGULAIR) 10 MG tablet, Take 1 tablet (10 mg total) by mouth at bedtime. (Patient taking differently: Take 10 mg by mouth every morning. ), Disp: 90 tablet, Rfl: 3 .  ranitidine (ZANTAC) 150 MG tablet, Take 1 tablet (150 mg total) by mouth as needed. (Patient taking differently: Take 150 mg by mouth daily as needed for heartburn. ), Disp: 90 tablet, Rfl: 3 .  tiotropium (SPIRIVA) 18 MCG inhalation capsule, Place 1 capsule (18 mcg total) into inhaler and inhale daily., Disp: 30 capsule, Rfl: 11 .  doxycycline (VIBRA-TABS) 100 MG tablet, Take 1 tablet (100 mg total) by mouth 2 (two) times daily., Disp: 20 tablet, Rfl: 1 .  HYDROcodone-homatropine (HYCODAN) 5-1.5 MG/5ML syrup, Take 5 mLs by mouth every 6 (six) hours as needed for cough., Disp: 120 mL, Rfl: 0 .  predniSONE (DELTASONE) 10 MG tablet, Take 1 tablet (10 mg total) by mouth daily with breakfast., Disp: 40 tablet, Rfl: 0 Continue all other maintenance medications as listed above.  Follow up plan: Return if symptoms worsen or fail to improve.  Educational handout given for [place] PA-C Nathalie [address]  [place], [place] [address] [phone]   08/18/2017, 8:56 AM

## 2017-08-18 NOTE — Patient Instructions (Signed)
In a few days you may receive a survey in the mail or online from [employer] regarding your visit with us today. Please take a moment to fill this out. Your feedback is very important to our whole office. It can help us better understand your needs as well as improve your experience and satisfaction. Thank you for taking your time to complete it. We care about you.  Rockie Schnoor, PA-C  

## 2017-08-24 ENCOUNTER — Encounter: Payer: Self-pay | Admitting: Physician Assistant

## 2017-08-24 NOTE — Progress Notes (Signed)
Cardiology Office Note    Date:  08/25/2017  ID:  [name], DOB 22-Jan-1951, MRN [id number] PCP:  Terald Sleeper, PA-C  Cardiologist:  Dr. Bronson Ing  Chief Complaint: "a quiver in my chest"  History of Present Illness:  [name] is a 66 y.o. female with history of paroxysmal atrial fibrillation in 2014, paroxysmal atrial flutter diagnosed in 05/2017, hypercholesterolemia, COPD, mitral valve prolapse (dx 1985, not seen on recent echo), mild mitral regurgitation, obesity who presents for evaluation of chest heaviness.   She was initially diagnosed with atrial fib in 2014 after admission for CAP. In followup in 2015 she was in NSR without requiring DCCV. In 2016 and 2017 she appeared to be in NSR by notes. In 05/2017 she was seen in the ER for new onset atrial flutter. She was back in NSR at time of visit 05/12/17. F/u echo 05/13/17 showed EF 83-66%, normal diastolic parameters, mild MR. She was also in NSR on 07/28/17. Dr. Bronson Ing continued her on 50mg  of metoprolol BID with additional 25mg  daily PRN. Lipids followed by PCP. She was seen by PCP 08/18/17 and felt to have sacroilitis and bronchitis and was started on prednisone and doxycycline. She states she never started the prednisone. HR at that visit was 84. She has history of daytime fatigue and snoring. Most recent labs showed positive screening FOBT recommending f/u GI (05/2017), normal TSH/fT4/CBC/Cr 0.65/glucose 121 in 05/2017. She has not followed up with GI - tells me today she assumed this was due to her blood thinner so decided not to follow through.  She reports that ever since 08/12/17 (the day she got the flu shot), she's noticed a strange sensation in her chest somewhat similar to when she had her arrhythmias in the past but also a little different. She's been following her HR and seeing readings up to the 130s, but settling down to the 80s particularly at night. When it slows down into the lower numbers, she will feel a  sensation of quivering in her chest. She adamantly denies any chest pain. She has had intermittent dyspnea, most notably on Sunday. However, without intervention she woke up on Monday and felt fine. She states she feels "fine" today. Reports compliance with meds including Eliquis without missed doses.    Past Medical History:  Diagnosis Date  . COPD (chronic obstructive pulmonary disease) ([facility])   . Hypercholesteremia   . Mild mitral regurgitation   . Mitral valve prolapse    a. dx 1985, not seen on echoes more recently.  . Obesity   . Paroxysmal atrial fibrillation ([facility])   . Paroxysmal atrial flutter ([facility])    a. dx 05/2017.    Past Surgical History:  Procedure Laterality Date  . ABDOMINAL HYSTERECTOMY    . CESAREAN SECTION    . TUBAL LIGATION      Current Medications: Current Meds  Medication Sig  . albuterol (PROAIR HFA) 108 (90 Base) MCG/ACT inhaler Inhale 2 puffs into the lungs every 6 (six) hours as needed for wheezing or shortness of breath.  . Cholecalciferol (VITAMIN D3) 5000 UNITS CAPS Take 1 capsule by mouth daily.  Marland Kitchen CRANBERRY PO Take 1 capsule by mouth daily.  Marland Kitchen doxycycline (VIBRA-TABS) 100 MG tablet Take 1 tablet (100 mg total) by mouth 2 (two) times daily.  Marland Kitchen ELIQUIS 5 MG TABS tablet TAKE  (1)  TABLET TWICE A DAY.  Marland Kitchen Fluticasone-Salmeterol (ADVAIR) 250-50 MCG/DOSE AEPB Inhale 1 puff into the lungs 2 (two) times daily.  Marland Kitchen  guaiFENesin (MUCINEX) 600 MG 12 hr tablet Take 600 mg by mouth every morning.   Marland Kitchen HYDROcodone-homatropine (HYCODAN) 5-1.5 MG/5ML syrup Take 5 mLs by mouth every 6 (six) hours as needed for cough.  Vanessa Kick Ethyl (VASCEPA) 1 g CAPS Take 2 g by mouth 2 (two) times daily.  . meloxicam (MOBIC) 7.5 MG tablet Take 1 tablet (7.5 mg total) by mouth daily.  . metoprolol tartrate (LOPRESSOR) 50 MG tablet Take 1 tablet (50 mg total) by mouth 2 (two) times daily.  . montelukast (SINGULAIR) 10 MG tablet Take 1 tablet (10 mg total) by mouth at bedtime. (Patient  taking differently: Take 10 mg by mouth every morning. )  . predniSONE (DELTASONE) 10 MG tablet Take 1 tablet (10 mg total) by mouth daily with breakfast.  . ranitidine (ZANTAC) 150 MG tablet Take 1 tablet (150 mg total) by mouth as needed. (Patient taking differently: Take 150 mg by mouth daily as needed for heartburn. )  . tiotropium (SPIRIVA) 18 MCG inhalation capsule Place 1 capsule (18 mcg total) into inhaler and inhale daily.     Allergies:   Penicillins   Social History   Social History  . Marital status: Married    Spouse name: N/A  . Number of children: 1  . Years of education: N/A   Occupational History  . [employer]    Social History Main Topics  . Smoking status: Former Smoker    Packs/day: 1.00    Years: 20.00    Types: Cigarettes    Quit date: 08/14/1990  . Smokeless tobacco: Never Used  . Alcohol use No  . Drug use: No  . Sexual activity: Not Currently   Other Topics Concern  . None   Social History Narrative  . None     Family History:  Family History  Problem Relation Age of Onset  . Heart attack Mother 17  . Asthma Mother   . Emphysema Father        smoked  . Atrial fibrillation Brother   . Hypertension Son     ROS:   Please see the history of present illness. All other systems are reviewed and otherwise negative.    PHYSICAL EXAM:   VS:  BP 126/74   Pulse (!) 134   Ht 5' 6.5" (1.689 m)   Wt 214 lb (97.1 kg)   SpO2 94%   BMI 34.02 kg/m   BMI: Body mass index is 34.02 kg/m. GEN: Well nourished, well developed WF, in no acute distress  HEENT: normocephalic, atraumatic Neck: no JVD, carotid bruits, or masses Cardiac: irregularly irregular, no murmurs, rubs, or gallops, trace ankle edema which patient states is chronic (varicose veins) Respiratory:  clear to auscultation bilaterally, normal work of breathing GI: soft, nontender, nondistended, + BS MS: no deformity or atrophy  Skin: warm and dry, no rash Neuro:  Alert and Oriented x  3, Strength and sensation are intact, follows commands Psych: euthymic mood, full affect  Wt Readings from Last 3 Encounters:  08/25/17 214 lb (97.1 kg)  08/18/17 212 lb 12.8 oz (96.5 kg)  07/28/17 211 lb (95.7 kg)      Studies/Labs Reviewed:   EKG:  2 tracings reviewed, both appear to show atrial flutter with nonspeciic ST-T changes - one in a 2:1 conduction and the other in a variable conduction  Recent Labs: 04/01/2017: ALT 15 05/10/2017: BUN 5; Creatinine, Ser 0.65; Hemoglobin 13.8; Platelets 301; Potassium 4.0; Sodium 139; TSH 1.569   Lipid Panel  Component Value Date/Time   CHOL 196 04/01/2017 1002   TRIG 161 (H) 04/01/2017 1002   HDL 45 04/01/2017 1002   CHOLHDL 4.4 04/01/2017 1002   LDLCALC 119 (H) 04/01/2017 1002    Additional studies/ records that were reviewed today include: Summarized above    ASSESSMENT & PLAN:   1. Paroxysmal atrial flutter - she reports episodic elevated HR since 08/12/17. She believes she's been out of rhythm since that time but cannot be sure. She has history of paroxysms of her atrial arrhythmias previously responsive to extra metoprolol but this time she has not tried that approach. Today she is marginally symptomatic, only reporting occasional "quiver." Her initial intake HR was 134 when in 2:1 block but is currently in the 90s while in variable block. Will add diltiazem CD 120mg  daily and follow. (Sinus HR previously in the 60-70 range.) Continue metoprolol. Check BMET, TSH, CBC. Given historical paroxysmal nature of her arrhythmias, I would not jump straight to cardioversion but this could be considered if she remains in atrial flutter despite diltiazem. She is also getting over a URI. We briefly touched on antiarrhythmic therapy but Ms. Buttery told me she traditionally is not a fan of changing a lot of medicines, so she would prefer to start one step at a time. She also was not previously interested in ablation. I'm not sure she would be a  good candidate for this as she has previously had atrial fibrillation as well. We discussed importance of long-term weight loss. Given her daytime sleepiness periodically as well as h/o snoring herself awake, would recommend sleep study. She does not wish to pursue at this time but she will think about it and get back if she decides to pursue. 2. Paroxysmal atrial fib - see above. Continue Eliquis. 3. Mild mitral regurgitation - follow clinically. Recent echo 05/2017 was stable without evidence of MVP. 4. Prior positive hemoccult - she did not f/u yet with GI because she assumed the positive test was due to Eliquis. We discussed that any kind of bleeding on a blood thinner is not normal and should be addressed, as blood thinners do not inherently cause bleeding but rather reveal bleeding. Will check CBC today. I encouraged her to f/u GI as recommended.  Disposition: F/u with Dr. Adonis Huguenin within 1 week.   Medication Adjustments/Labs and Tests Ordered: Current medicines are reviewed at length with the patient today.  Concerns regarding medicines are outlined above. Medication changes, Labs and Tests ordered today are summarized above and listed in the Patient Instructions accessible in Encounters.   Signed, Charlie Pitter, PA-C  08/25/2017 2:24 PM    [facility] Location in [place], [place] [address] Ph: [phone]; Fax [phone]

## 2017-08-25 ENCOUNTER — Ambulatory Visit (INDEPENDENT_AMBULATORY_CARE_PROVIDER_SITE_OTHER): Payer: Medicare HMO | Admitting: Physician Assistant

## 2017-08-25 ENCOUNTER — Encounter: Payer: Self-pay | Admitting: Physician Assistant

## 2017-08-25 ENCOUNTER — Other Ambulatory Visit (HOSPITAL_COMMUNITY)
Admission: RE | Admit: 2017-08-25 | Discharge: 2017-08-25 | Disposition: A | Discharge status: Home / Self Care | Payer: Medicare HMO | Source: Ambulatory Visit | Attending: Physician Assistant | Admitting: Physician Assistant

## 2017-08-25 VITALS — BP 126/74 | HR 134 | Ht 66.5 in | Wt 214.0 lb

## 2017-08-25 DIAGNOSIS — R002 Palpitations: Secondary | ICD-10-CM | POA: Diagnosis not present

## 2017-08-25 DIAGNOSIS — I4892 Unspecified atrial flutter: Secondary | ICD-10-CM | POA: Diagnosis not present

## 2017-08-25 DIAGNOSIS — I340 Nonrheumatic mitral (valve) insufficiency: Secondary | ICD-10-CM | POA: Diagnosis not present

## 2017-08-25 DIAGNOSIS — R195 Other fecal abnormalities: Secondary | ICD-10-CM

## 2017-08-25 DIAGNOSIS — I480 Paroxysmal atrial fibrillation: Secondary | ICD-10-CM | POA: Insufficient documentation

## 2017-08-25 LAB — CBC WITH DIFFERENTIAL/PLATELET
BASOS PCT: 0 %
Basophils Absolute: 0 10*3/uL (ref 0.0–0.1)
EOS ABS: 0.2 10*3/uL (ref 0.0–0.7)
Eosinophils Relative: 3 %
HCT: 39 % (ref 36.0–46.0)
Hemoglobin: 12.3 g/dL (ref 12.0–15.0)
Lymphocytes Relative: 31 %
Lymphs Abs: 2.3 10*3/uL (ref 0.7–4.0)
MCH: 29.3 pg (ref 26.0–34.0)
MCHC: 31.5 g/dL (ref 30.0–36.0)
MCV: 92.9 fL (ref 78.0–100.0)
MONO ABS: 0.4 10*3/uL (ref 0.1–1.0)
MONOS PCT: 5 %
Neutro Abs: 4.5 10*3/uL (ref 1.7–7.7)
Neutrophils Relative %: 61 %
PLATELETS: 289 10*3/uL (ref 150–400)
RBC: 4.2 MIL/uL (ref 3.87–5.11)
RDW: 13.7 % (ref 11.5–15.5)
WBC: 7.3 10*3/uL (ref 4.0–10.5)

## 2017-08-25 LAB — BASIC METABOLIC PANEL
Anion gap: 7 (ref 5–15)
BUN: 15 mg/dL (ref 6–20)
CALCIUM: 9.1 mg/dL (ref 8.9–10.3)
CO2: 31 mmol/L (ref 22–32)
Chloride: 102 mmol/L (ref 101–111)
Creatinine, Ser: 0.92 mg/dL (ref 0.44–1.00)
GFR calc Af Amer: >60 mL/min (ref >60)
GLUCOSE: 104 mg/dL — AB (ref 65–99)
Potassium: 4 mmol/L (ref 3.5–5.1)
SODIUM: 140 mmol/L (ref 135–145)

## 2017-08-25 LAB — TSH: TSH: 1.556 u[IU]/mL (ref 0.350–4.500)

## 2017-08-25 MED ORDER — DILTIAZEM HCL ER COATED BEADS 120 MG PO CP24: 120.0000 mg | ORAL_CAPSULE | Freq: Every day | ORAL | 1 refills | Status: DC

## 2017-08-25 NOTE — Patient Instructions (Signed)
Medication Instructions:  Your physician has recommended you make the following change in your medication:  Start Diltiazem 120 mg Daily    Labwork: Your physician recommends that you return for lab work in: Today    Testing/Procedures: Let us know if you decide to go ahead with sleep study.   Follow-Up: Your physician recommends that you schedule a follow-up appointment in: 1 Week    Any Other Special Instructions Will Be Listed Below (If Applicable).     If you need a refill on your cardiac medications before your next appointment, please call your pharmacy.  Thank you for choosing [facility]!

## 2017-08-31 NOTE — Progress Notes (Signed)
Cardiology Office Note   Date:  09/01/2017   ID:  [name], DOB 03/15/1951, MRN [id number]  PCP:  Terald Sleeper, PA-C  Cardiologist:  Kate Sable, MD  Chief Complaint  Patient presents with  . Palpitations  . Atrial Fibrillation    PAF      History of Present Illness: [name] is a 66 y.o. female who presents for ongoing assessment and management of paroxysmal atrial fibrillation, paroxysmal atrial flutter, hypercholesterolemia, mitral valve prolapse, history of COPD, and obesity.  She was recently seen in the office by Sharrell Ku PA, on 08/25/2017.  At that time she reported "a strange sensation" in her chest similar to what she had experienced when she had arrhythmias.  This happened not long after receiving her flu shot on 08/12/2017.  She states that she had felt her heart racing up to 130 bpm based upon her blood pressure machine.  [name] added diltiazem 120 mg daily, continue metoprolol, check to be met, TSH, and CBC.  She was also recommended for a sleep study but did not wish to pursue it at that time.  She was continued on Eliquis.  She had not yet followed up with GI for history of positive Hemoccult.    Labs: Sodium 140, potassium 4.0, chloride 102, CO2 31, glucose 104, BUN 15, creatinine 0.92.  Hemoglobin 12.3, hematocrit 39.0, white blood cells 7.3, platelets 289.  TSH was within normal limits at 1.556.  The patient comes today not feeling any differently.  She states that she still can feel palpitations despite use of diltiazem.  The patient has not had any dizziness, fatigue, or fluid retention.  She states that she mostly feels the palpitations occur when her heart rate is slower.  However, the patient checks her oxygen saturation frequently at home and notices that her heart rate can sometimes get up into the 130s despite taking medication.  She states this occurs daily.  She continues to be medically compliant and does take Eliquis as  directed.  Past Medical History:  Diagnosis Date  . COPD (chronic obstructive pulmonary disease) ([facility])   . Hypercholesteremia   . Mild mitral regurgitation   . Mitral valve prolapse    a. dx 1985, not seen on echoes more recently.  . Obesity   . Paroxysmal atrial fibrillation ([facility])   . Paroxysmal atrial flutter ([facility])    a. dx 05/2017.    Past Surgical History:  Procedure Laterality Date  . ABDOMINAL HYSTERECTOMY    . CESAREAN SECTION    . TUBAL LIGATION       Current Outpatient Prescriptions  Medication Sig Dispense Refill  . albuterol (PROAIR HFA) 108 (90 Base) MCG/ACT inhaler Inhale 2 puffs into the lungs every 6 (six) hours as needed for wheezing or shortness of breath. 1 Inhaler 11  . Cholecalciferol (VITAMIN D3) 5000 UNITS CAPS Take 1 capsule by mouth daily.    Marland Kitchen CRANBERRY PO Take 1 capsule by mouth daily.    Marland Kitchen diltiazem (CARDIZEM CD) 120 MG 24 hr capsule Take 1 capsule (120 mg total) by mouth daily. 30 capsule 6  . doxycycline (VIBRA-TABS) 100 MG tablet Take 1 tablet (100 mg total) by mouth 2 (two) times daily. 20 tablet 1  . ELIQUIS 5 MG TABS tablet TAKE  (1)  TABLET TWICE A DAY. 60 tablet 3  . Fluticasone-Salmeterol (ADVAIR) 250-50 MCG/DOSE AEPB Inhale 1 puff into the lungs 2 (two) times daily.    Marland Kitchen guaiFENesin (MUCINEX) 600 MG  12 hr tablet Take 600 mg by mouth every morning.     Marland Kitchen HYDROcodone-homatropine (HYCODAN) 5-1.5 MG/5ML syrup Take 5 mLs by mouth every 6 (six) hours as needed for cough. 120 mL 0  . Icosapent Ethyl (VASCEPA) 1 g CAPS Take 2 g by mouth 2 (two) times daily. 120 capsule 5  . meloxicam (MOBIC) 7.5 MG tablet Take 1 tablet (7.5 mg total) by mouth daily. 90 tablet 3  . metoprolol tartrate (LOPRESSOR) 50 MG tablet Take 1 tablet (50 mg total) by mouth 2 (two) times daily. 180 tablet 3  . montelukast (SINGULAIR) 10 MG tablet Take 1 tablet (10 mg total) by mouth at bedtime. (Patient taking differently: Take 10 mg by mouth every morning. ) 90 tablet 3  .  ranitidine (ZANTAC) 150 MG tablet Take 1 tablet (150 mg total) by mouth as needed. (Patient taking differently: Take 150 mg by mouth daily as needed for heartburn. ) 90 tablet 3  . tiotropium (SPIRIVA) 18 MCG inhalation capsule Place 1 capsule (18 mcg total) into inhaler and inhale daily. 30 capsule 11   No current facility-administered medications for this visit.     Allergies:   Penicillins    Social History:  The patient  reports that she quit smoking about 27 years ago. Her smoking use included Cigarettes. She has a 20.00 pack-year smoking history. She has never used smokeless tobacco. She reports that she does not drink alcohol or use drugs.   Family History:  The patient's family history includes Asthma in her mother; Atrial fibrillation in her brother; Emphysema in her father; Heart attack (age of onset: 82) in her mother; Hypertension in her son.    ROS: All other systems are reviewed and negative. Unless otherwise mentioned in H&P    PHYSICAL EXAM: VS:  BP 106/68 (BP Location: Right Arm)   Pulse 76   Ht 5' 6.5" (1.689 m)   Wt 212 lb (96.2 kg)   SpO2 93%   BMI 33.71 kg/m  , BMI Body mass index is 33.71 kg/m. GEN: Well nourished, well developed, in no acute distress  HEENT: normal  Neck: no JVD, carotid bruits, or masses Cardiac: IRRR; frequent extrasystole no murmurs, rubs, or gallops,no edema  Respiratory:  clear to auscultation bilaterally, normal work of breathing GI: soft, nontender, nondistended, + BS MS: no deformity or atrophy  Skin: warm and dry, no rash Neuro:  Strength and sensation are intact Psych: euthymic mood, full affect   Recent Labs: 04/01/2017: ALT 15 08/25/2017: BUN 15; Creatinine, Ser 0.92; Hemoglobin 12.3; Platelets 289; Potassium 4.0; Sodium 140; TSH 1.556    Lipid Panel    Component Value Date/Time   CHOL 196 04/01/2017 1002   TRIG 161 (H) 04/01/2017 1002   HDL 45 04/01/2017 1002   CHOLHDL 4.4 04/01/2017 1002   LDLCALC 119 (H)  04/01/2017 1002      Wt Readings from Last 3 Encounters:  09/01/17 212 lb (96.2 kg)  08/25/17 214 lb (97.1 kg)  08/18/17 212 lb 12.8 oz (96.5 kg)      Other studies Reviewed: Echocardiogram May 16, 2017 Left ventricle: The cavity size was normal. Wall thickness was   normal. Systolic function was normal. The estimated ejection   fraction was in the range of 60% to 65%. Wall motion was normal;   there were no regional wall motion abnormalities. Left   ventricular diastolic function parameters were normal. - Aortic valve: Trileaflet; mildly thickened leaflets. - Mitral valve: There was mild regurgitation.  ASSESSMENT AND  PLAN:  1.  Paroxysmal atrial fibrillation: The patient states that she continues to have frequent heart rate elevations throughout the day which she notices by her oxygen saturation finger monitor although she is unable to feel them.  She states that she notices the palpitations more when her heart rate is slower.  She  states that she is not feeling any better with diltiazem dose.    I will place a 48-hour Holter monitor for definitive  documentation of frequency of heart rate elevations and for average heart rate throughout the day.  I will not make any medication changes at this time.  Currently her heart rate is well controlled.  It may be necessary for her to be seen by EP.  She is not interested in cardioversion or sleep study.   2.  COPD: The patient continues to have chronic symptoms.  She does use albuterol inhaler as needed  I am not hearing any wheezing during this office visit.  She is to follow with PCP for ongoing management.  Current medicines are reviewed at length with the patient today.    Labs/ tests ordered today include: Holter monitor-48-hour  Phill Myron. West Pugh, ANP, AACC   09/01/2017 4:38 PM    [facility] [address], [place], [place] [address] Phone: [phone]; Fax: [phone]

## 2017-09-01 ENCOUNTER — Ambulatory Visit (INDEPENDENT_AMBULATORY_CARE_PROVIDER_SITE_OTHER): Payer: Medicare HMO | Admitting: Adult Health

## 2017-09-01 ENCOUNTER — Encounter: Payer: Self-pay | Admitting: Adult Health

## 2017-09-01 VITALS — BP 106/68 | HR 76 | Ht 66.5 in | Wt 212.0 lb

## 2017-09-01 DIAGNOSIS — I480 Paroxysmal atrial fibrillation: Secondary | ICD-10-CM

## 2017-09-01 MED ORDER — DILTIAZEM HCL ER COATED BEADS 120 MG PO CP24: 120.0000 mg | ORAL_CAPSULE | Freq: Every day | ORAL | 6 refills | Status: DC

## 2017-09-01 NOTE — Patient Instructions (Signed)
Medication Instructions:  Your physician recommends that you continue on your current medications as directed. Please refer to the Current Medication list given to you today.   Labwork: NONE   Testing/Procedures: Your physician has recommended that you wear a holter monitor. Holter monitors are medical devices that record the heart's electrical activity. Doctors most often use these monitors to diagnose arrhythmias. Arrhythmias are problems with the speed or rhythm of the heartbeat. The monitor is a small, portable device. You can wear one while you do your normal daily activities. This is usually used to diagnose what is causing palpitations/syncope (passing out).    Follow-Up: Your physician recommends that you schedule a follow-up appointment after test.    Any Other Special Instructions Will Be Listed Below (If Applicable).     If you need a refill on your cardiac medications before your next appointment, please call your pharmacy. Thank you for choosing [facility]!

## 2017-09-02 ENCOUNTER — Ambulatory Visit (HOSPITAL_COMMUNITY)
Admission: RE | Admit: 2017-09-02 | Discharge: 2017-09-02 | Disposition: A | Discharge status: Home / Self Care | Payer: Medicare HMO | Source: Ambulatory Visit | Attending: Adult Health | Admitting: Adult Health

## 2017-09-02 DIAGNOSIS — I340 Nonrheumatic mitral (valve) insufficiency: Secondary | ICD-10-CM | POA: Diagnosis not present

## 2017-09-02 DIAGNOSIS — R002 Palpitations: Secondary | ICD-10-CM | POA: Insufficient documentation

## 2017-09-02 DIAGNOSIS — I480 Paroxysmal atrial fibrillation: Secondary | ICD-10-CM | POA: Insufficient documentation

## 2017-09-10 ENCOUNTER — Encounter: Payer: Self-pay | Admitting: Cardiovascular Disease

## 2017-09-10 ENCOUNTER — Ambulatory Visit (INDEPENDENT_AMBULATORY_CARE_PROVIDER_SITE_OTHER): Payer: Medicare HMO | Admitting: Cardiovascular Disease

## 2017-09-10 VITALS — BP 116/70 | HR 72 | Ht 66.5 in | Wt 209.0 lb

## 2017-09-10 DIAGNOSIS — R002 Palpitations: Secondary | ICD-10-CM | POA: Diagnosis not present

## 2017-09-10 DIAGNOSIS — I481 Persistent atrial fibrillation: Secondary | ICD-10-CM

## 2017-09-10 DIAGNOSIS — I4892 Unspecified atrial flutter: Secondary | ICD-10-CM

## 2017-09-10 DIAGNOSIS — I4819 Other persistent atrial fibrillation: Secondary | ICD-10-CM

## 2017-09-10 DIAGNOSIS — F419 Anxiety disorder, unspecified: Secondary | ICD-10-CM | POA: Diagnosis not present

## 2017-09-10 MED ORDER — DILTIAZEM HCL ER COATED BEADS 180 MG PO CP24: 180.0000 mg | ORAL_CAPSULE | Freq: Every day | ORAL | 3 refills | Status: DC

## 2017-09-10 NOTE — Progress Notes (Signed)
SUBJECTIVE: The patient presents for follow-up of persistent atrial fibrillation and atrial flutter.   Echocardiogram 05/13/17: Normal left ventricular systolic and diastolic function, LVEF 68-12%, mild mitral regurgitation. Left atrial size is normal.  I last saw her on 07/28/17.  She then came to our office and saw Melina Copa PA-C on 08/25/17.  She was complaining of palpitations.  She was having paroxysms of atrial flutter.  Long-acting diltiazem 120 mg daily was added.  She was getting over an upper respiratory tract infection.  She then saw K.  Lawrence DNP on 09/01/17.  She was not feeling any better.  Holter monitoring was arranged.  It was noted that she was not interested in either a cardioversion or sleep study.  Average heart rate was 93 bpm with atrial fibrillation and flutter noted.  She feels no better.  She is anxious and afraid about being sedated for a cardioversion.  She wants to try medical therapy.    Review of Systems: As per "subjective", otherwise negative.  Allergies  Allergen Reactions  . Penicillins Rash    Has patient had a PCN reaction causing immediate rash, facial/tongue/throat swelling, SOB or lightheadedness with hypotension: Yes Has patient had a PCN reaction causing severe rash involving mucus membranes or skin necrosis: Yes Has patient had a PCN reaction that required hospitalization: No Has patient had a PCN reaction occurring within the last 10 years: No If all of the above answers are "NO", then may proceed with Cephalosporin use.     Current Outpatient Medications  Medication Sig Dispense Refill  . albuterol (PROAIR HFA) 108 (90 Base) MCG/ACT inhaler Inhale 2 puffs into the lungs every 6 (six) hours as needed for wheezing or shortness of breath. 1 Inhaler 11  . Cholecalciferol (VITAMIN D3) 5000 UNITS CAPS Take 1 capsule by mouth daily.    Marland Kitchen diltiazem (CARDIZEM CD) 120 MG 24 hr capsule Take 1 capsule (120 mg total) by mouth daily. 30  capsule 6  . doxycycline (VIBRA-TABS) 100 MG tablet Take 1 tablet (100 mg total) by mouth 2 (two) times daily. 20 tablet 1  . ELIQUIS 5 MG TABS tablet TAKE  (1)  TABLET TWICE A DAY. 60 tablet 3  . Fluticasone-Salmeterol (ADVAIR) 250-50 MCG/DOSE AEPB Inhale 1 puff into the lungs 2 (two) times daily.    Marland Kitchen guaiFENesin (MUCINEX) 600 MG 12 hr tablet Take 600 mg by mouth every morning.     Marland Kitchen HYDROcodone-homatropine (HYCODAN) 5-1.5 MG/5ML syrup Take 5 mLs by mouth every 6 (six) hours as needed for cough. 120 mL 0  . Icosapent Ethyl (VASCEPA) 1 g CAPS Take 2 g by mouth 2 (two) times daily. 120 capsule 5  . meloxicam (MOBIC) 7.5 MG tablet Take 1 tablet (7.5 mg total) by mouth daily. 90 tablet 3  . metoprolol tartrate (LOPRESSOR) 50 MG tablet Take 1 tablet (50 mg total) by mouth 2 (two) times daily. 180 tablet 3  . montelukast (SINGULAIR) 10 MG tablet Take 1 tablet (10 mg total) by mouth at bedtime. (Patient taking differently: Take 10 mg by mouth every morning. ) 90 tablet 3  . ranitidine (ZANTAC) 150 MG tablet Take 1 tablet (150 mg total) by mouth as needed. (Patient taking differently: Take 150 mg by mouth daily as needed for heartburn. ) 90 tablet 3  . tiotropium (SPIRIVA) 18 MCG inhalation capsule Place 1 capsule (18 mcg total) into inhaler and inhale daily. 30 capsule 11   No current facility-administered medications for this visit.  Past Medical History:  Diagnosis Date  . COPD (chronic obstructive pulmonary disease) ([place])   . Hypercholesteremia   . Mild mitral regurgitation   . Mitral valve prolapse    a. dx 1985, not seen on echoes more recently.  . Obesity   . Paroxysmal atrial fibrillation (HCC)   . Paroxysmal atrial flutter ([place])    a. dx 05/2017.    Past Surgical History:  Procedure Laterality Date  . ABDOMINAL HYSTERECTOMY    . CESAREAN SECTION    . TUBAL LIGATION      Social History   Socioeconomic History  . Marital status: Married    Spouse name: Not on file  .  Number of children: 1  . Years of education: Not on file  . Highest education level: Not on file  Social Needs  . Financial resource strain: Not on file  . Food insecurity - worry: Not on file  . Food insecurity - inability: Not on file  . Transportation needs - medical: Not on file  . Transportation needs - non-medical: Not on file  Occupational History  . Occupation: Actuary  Tobacco Use  . Smoking status: Former Smoker    Packs/day: 1.00    Years: 20.00    Pack years: 20.00    Types: Cigarettes    Last attempt to quit: 08/14/1990    Years since quitting: 27.0  . Smokeless tobacco: Never Used  Substance and Sexual Activity  . Alcohol use: No    Alcohol/week: 0.0 oz  . Drug use: No  . Sexual activity: Not Currently  Other Topics Concern  . Not on file  Social History Narrative  . Not on file     Vitals:   09/10/17 0854  BP: 116/70  Pulse: 72  SpO2: 93%  Weight: 209 lb (94.8 kg)  Height: 5' 6.5" (1.689 m)    Wt Readings from Last 3 Encounters:  09/10/17 209 lb (94.8 kg)  09/01/17 212 lb (96.2 kg)  08/25/17 214 lb (97.1 kg)     PHYSICAL EXAM General: NAD HEENT: Normal. Neck: No JVD, no thyromegaly. Lungs: Clear to auscultation bilaterally with normal respiratory effort. CV: Regular rate and irregular rhythm, normal S1/S2, no S3/, no murmur. No pretibial or periankle edema.  Abdomen: Soft, nontender, no distention.  Neurologic: Alert and oriented.  Psych: Normal affect. Skin: Normal. Musculoskeletal: No gross deformities.    ECG: Most recent ECG reviewed.   Labs: Lab Results  Component Value Date/Time   K 4.0 08/25/2017 03:08 PM   BUN 15 08/25/2017 03:08 PM   BUN 17 04/01/2017 10:02 AM   CREATININE 0.92 08/25/2017 03:08 PM   ALT 15 04/01/2017 10:02 AM   TSH 1.556 08/25/2017 03:08 PM   TSH 2.100 04/01/2017 10:02 AM   HGB 12.3 08/25/2017 03:08 PM   HGB 12.6 04/01/2017 10:02 AM     Lipids: Lab Results  Component Value Date/Time    LDLCALC 119 (H) 04/01/2017 10:02 AM   CHOL 196 04/01/2017 10:02 AM   TRIG 161 (H) 04/01/2017 10:02 AM   HDL 45 04/01/2017 10:02 AM       ASSESSMENT AND PLAN: 1.  Persistent atrial fibrillation and flutter: She continues to be symptomatic in spite of metoprolol 50 mg twice daily and long-acting diltiazem 120 mg daily.  She is anxious about being cardioverted and does not want to consider ablation.  She is appropriately anticoagulated with Eliquis 5 mg twice daily.  I will increase long-acting diltiazem to 180 mg daily.  Disposition: Follow up 2 months    Kate Sable, M.D., F.A.C.C.

## 2017-09-10 NOTE — Patient Instructions (Signed)
Your physician recommends that you schedule a follow-up appointment in:  2 months with Dr.Koneswaran    INCREASE Cardizem to 180 mg daily    NO Lab work or test ordered today.       Thank you for choosing [facility] !

## 2017-09-10 NOTE — H&P (View-Only) (Signed)
SUBJECTIVE: The patient presents for follow-up of persistent atrial fibrillation and atrial flutter.   Echocardiogram 05/13/17: Normal left ventricular systolic and diastolic function, LVEF 25-85%, mild mitral regurgitation. Left atrial size is normal.  I last saw her on 07/28/17.  She then came to our office and saw Melina Copa PA-C on 08/25/17.  She was complaining of palpitations.  She was having paroxysms of atrial flutter.  Long-acting diltiazem 120 mg daily was added.  She was getting over an upper respiratory tract infection.  She then saw K.  Lawrence DNP on 09/01/17.  She was not feeling any better.  Holter monitoring was arranged.  It was noted that she was not interested in either a cardioversion or sleep study.  Average heart rate was 93 bpm with atrial fibrillation and flutter noted.  She feels no better.  She is anxious and afraid about being sedated for a cardioversion.  She wants to try medical therapy.    Review of Systems: As per "subjective", otherwise negative.  Allergies  Allergen Reactions  . Penicillins Rash    Has patient had a PCN reaction causing immediate rash, facial/tongue/throat swelling, SOB or lightheadedness with hypotension: Yes Has patient had a PCN reaction causing severe rash involving mucus membranes or skin necrosis: Yes Has patient had a PCN reaction that required hospitalization: No Has patient had a PCN reaction occurring within the last 10 years: No If all of the above answers are "NO", then may proceed with Cephalosporin use.     Current Outpatient Medications  Medication Sig Dispense Refill  . albuterol (PROAIR HFA) 108 (90 Base) MCG/ACT inhaler Inhale 2 puffs into the lungs every 6 (six) hours as needed for wheezing or shortness of breath. 1 Inhaler 11  . Cholecalciferol (VITAMIN D3) 5000 UNITS CAPS Take 1 capsule by mouth daily.    Marland Kitchen diltiazem (CARDIZEM CD) 120 MG 24 hr capsule Take 1 capsule (120 mg total) by mouth daily. 30  capsule 6  . doxycycline (VIBRA-TABS) 100 MG tablet Take 1 tablet (100 mg total) by mouth 2 (two) times daily. 20 tablet 1  . ELIQUIS 5 MG TABS tablet TAKE  (1)  TABLET TWICE A DAY. 60 tablet 3  . Fluticasone-Salmeterol (ADVAIR) 250-50 MCG/DOSE AEPB Inhale 1 puff into the lungs 2 (two) times daily.    Marland Kitchen guaiFENesin (MUCINEX) 600 MG 12 hr tablet Take 600 mg by mouth every morning.     Marland Kitchen HYDROcodone-homatropine (HYCODAN) 5-1.5 MG/5ML syrup Take 5 mLs by mouth every 6 (six) hours as needed for cough. 120 mL 0  . Icosapent Ethyl (VASCEPA) 1 g CAPS Take 2 g by mouth 2 (two) times daily. 120 capsule 5  . meloxicam (MOBIC) 7.5 MG tablet Take 1 tablet (7.5 mg total) by mouth daily. 90 tablet 3  . metoprolol tartrate (LOPRESSOR) 50 MG tablet Take 1 tablet (50 mg total) by mouth 2 (two) times daily. 180 tablet 3  . montelukast (SINGULAIR) 10 MG tablet Take 1 tablet (10 mg total) by mouth at bedtime. (Patient taking differently: Take 10 mg by mouth every morning. ) 90 tablet 3  . ranitidine (ZANTAC) 150 MG tablet Take 1 tablet (150 mg total) by mouth as needed. (Patient taking differently: Take 150 mg by mouth daily as needed for heartburn. ) 90 tablet 3  . tiotropium (SPIRIVA) 18 MCG inhalation capsule Place 1 capsule (18 mcg total) into inhaler and inhale daily. 30 capsule 11   No current facility-administered medications for this visit.  Past Medical History:  Diagnosis Date  . COPD (chronic obstructive pulmonary disease) ([place])   . Hypercholesteremia   . Mild mitral regurgitation   . Mitral valve prolapse    a. dx 1985, not seen on echoes more recently.  . Obesity   . Paroxysmal atrial fibrillation (HCC)   . Paroxysmal atrial flutter ([place])    a. dx 05/2017.    Past Surgical History:  Procedure Laterality Date  . ABDOMINAL HYSTERECTOMY    . CESAREAN SECTION    . TUBAL LIGATION      Social History   Socioeconomic History  . Marital status: Married    Spouse name: Not on file  .  Number of children: 1  . Years of education: Not on file  . Highest education level: Not on file  Social Needs  . Financial resource strain: Not on file  . Food insecurity - worry: Not on file  . Food insecurity - inability: Not on file  . Transportation needs - medical: Not on file  . Transportation needs - non-medical: Not on file  Occupational History  . Occupation: Actuary  Tobacco Use  . Smoking status: Former Smoker    Packs/day: 1.00    Years: 20.00    Pack years: 20.00    Types: Cigarettes    Last attempt to quit: 08/14/1990    Years since quitting: 27.0  . Smokeless tobacco: Never Used  Substance and Sexual Activity  . Alcohol use: No    Alcohol/week: 0.0 oz  . Drug use: No  . Sexual activity: Not Currently  Other Topics Concern  . Not on file  Social History Narrative  . Not on file     Vitals:   09/10/17 0854  BP: 116/70  Pulse: 72  SpO2: 93%  Weight: 209 lb (94.8 kg)  Height: 5' 6.5" (1.689 m)    Wt Readings from Last 3 Encounters:  09/10/17 209 lb (94.8 kg)  09/01/17 212 lb (96.2 kg)  08/25/17 214 lb (97.1 kg)     PHYSICAL EXAM General: NAD HEENT: Normal. Neck: No JVD, no thyromegaly. Lungs: Clear to auscultation bilaterally with normal respiratory effort. CV: Regular rate and irregular rhythm, normal S1/S2, no S3/, no murmur. No pretibial or periankle edema.  Abdomen: Soft, nontender, no distention.  Neurologic: Alert and oriented.  Psych: Normal affect. Skin: Normal. Musculoskeletal: No gross deformities.    ECG: Most recent ECG reviewed.   Labs: Lab Results  Component Value Date/Time   K 4.0 08/25/2017 03:08 PM   BUN 15 08/25/2017 03:08 PM   BUN 17 04/01/2017 10:02 AM   CREATININE 0.92 08/25/2017 03:08 PM   ALT 15 04/01/2017 10:02 AM   TSH 1.556 08/25/2017 03:08 PM   TSH 2.100 04/01/2017 10:02 AM   HGB 12.3 08/25/2017 03:08 PM   HGB 12.6 04/01/2017 10:02 AM     Lipids: Lab Results  Component Value Date/Time    LDLCALC 119 (H) 04/01/2017 10:02 AM   CHOL 196 04/01/2017 10:02 AM   TRIG 161 (H) 04/01/2017 10:02 AM   HDL 45 04/01/2017 10:02 AM       ASSESSMENT AND PLAN: 1.  Persistent atrial fibrillation and flutter: She continues to be symptomatic in spite of metoprolol 50 mg twice daily and long-acting diltiazem 120 mg daily.  She is anxious about being cardioverted and does not want to consider ablation.  She is appropriately anticoagulated with Eliquis 5 mg twice daily.  I will increase long-acting diltiazem to 180 mg daily.  Disposition: Follow up 2 months    Kate Sable, M.D., F.A.C.C.

## 2017-09-22 ENCOUNTER — Other Ambulatory Visit: Payer: Self-pay | Admitting: Cardiovascular Disease

## 2017-09-22 ENCOUNTER — Ambulatory Visit: Payer: Medicare HMO | Admitting: Physician Assistant

## 2017-09-22 ENCOUNTER — Encounter: Payer: Self-pay | Admitting: Physician Assistant

## 2017-09-22 ENCOUNTER — Telehealth: Payer: Self-pay | Admitting: Cardiovascular Disease

## 2017-09-22 VITALS — BP 98/60 | HR 67 | Temp 96.8°F | Ht 66.5 in | Wt 204.8 lb

## 2017-09-22 DIAGNOSIS — J449 Chronic obstructive pulmonary disease, unspecified: Secondary | ICD-10-CM

## 2017-09-22 DIAGNOSIS — I059 Rheumatic mitral valve disease, unspecified: Secondary | ICD-10-CM | POA: Diagnosis not present

## 2017-09-22 DIAGNOSIS — Z1231 Encounter for screening mammogram for malignant neoplasm of breast: Secondary | ICD-10-CM | POA: Diagnosis not present

## 2017-09-22 DIAGNOSIS — I4819 Other persistent atrial fibrillation: Secondary | ICD-10-CM

## 2017-09-22 DIAGNOSIS — I481 Persistent atrial fibrillation: Secondary | ICD-10-CM | POA: Diagnosis not present

## 2017-09-22 NOTE — Telephone Encounter (Signed)
    Cardioversion scheduled for Thursday 10/08/17 @ 9:00am.   [name] - Please advise patient to arrive at 7:30am to [facility]. Also advise of pre op scheduled for Friday 10/02/17 @ 1:15pm.    Please have her hold diltiazem morning of. She can take metoprolol.

## 2017-09-22 NOTE — Patient Instructions (Signed)
In a few days you may receive a survey in the mail or online from [employer] regarding your visit with us today. Please take a moment to fill this out. Your feedback is very important to our whole office. It can help us better understand your needs as well as improve your experience and satisfaction. Thank you for taking your time to complete it. We care about you.  Zoriah Pulice, PA-C  

## 2017-09-22 NOTE — Telephone Encounter (Signed)
Please schedule with me during a hospital week.

## 2017-09-22 NOTE — Telephone Encounter (Signed)
Patient notified of cardioversion date/time, instructions

## 2017-09-22 NOTE — Progress Notes (Signed)
BP 98/60   Pulse 67   Temp (!) 96.8 F (36 C) (Oral)   Ht 5' 6.5" (1.689 m)   Wt 204 lb 12.8 oz (92.9 kg)   BMI 32.56 kg/m    Subjective:    Patient ID: [name], female    DOB: [birth year], 66 y.o.   MRN: [id number]  HPI: [name] is a 66 y.o. female presenting on 09/22/2017 for Follow-up (6 month ) and COPD  Patient comes in today for recheck on her conditions.  Her atrial relation has been more out of control.  There was an adjustment made in her medication of diltiazem up to 180 mg.  Since then she has been having some times where she feels dizzy upon standing.  Overall she has fairly well controlled blood pressure.  Says today it is low.  She can still feel the atrial fibrillation symptoms. Her COPD symptoms are very well controlled at this time. We have had a conversation about her getting back in touch with her cardiologist about cardioversion.  I recommended that she try to work on this and it will probably be 1 of the best resolutions to her condition.  Relevant past medical, surgical, family and social history reviewed and updated as indicated. Allergies and medications reviewed and updated.  Past Medical History:  Diagnosis Date  . COPD (chronic obstructive pulmonary disease) ([place])   . Hypercholesteremia   . Mild mitral regurgitation   . Mitral valve prolapse    a. dx 1985, not seen on echoes more recently.  . Obesity   . Paroxysmal atrial fibrillation (HCC)   . Paroxysmal atrial flutter ([place])    a. dx 05/2017.    Past Surgical History:  Procedure Laterality Date  . ABDOMINAL HYSTERECTOMY    . CESAREAN SECTION    . TUBAL LIGATION      Review of Systems  Constitutional: Positive for fatigue. Negative for activity change and fever.  HENT: Negative.   Eyes: Negative.   Respiratory: Negative.  Negative for cough, shortness of breath and wheezing.   Cardiovascular: Positive for palpitations. Negative for chest pain and leg swelling.    Gastrointestinal: Negative.  Negative for abdominal pain.  Endocrine: Negative.   Genitourinary: Negative.  Negative for dysuria.  Musculoskeletal: Negative.   Skin: Negative.   Neurological: Positive for dizziness and weakness.    Allergies as of 09/22/2017      Reactions   Penicillins Rash   Has patient had a PCN reaction causing immediate rash, facial/tongue/throat swelling, SOB or lightheadedness with hypotension: Yes Has patient had a PCN reaction causing severe rash involving mucus membranes or skin necrosis: Yes Has patient had a PCN reaction that required hospitalization: No Has patient had a PCN reaction occurring within the last 10 years: No If all of the above answers are "NO", then may proceed with Cephalosporin use.      Medication List        Accurate as of 09/22/17 10:04 AM. Always use your most recent med list.          albuterol 108 (90 Base) MCG/ACT inhaler Commonly known as:  PROAIR HFA Inhale 2 puffs into the lungs every 6 (six) hours as needed for wheezing or shortness of breath.   diltiazem 180 MG 24 hr capsule Commonly known as:  CARDIZEM CD Take 1 capsule (180 mg total) daily by mouth.   doxycycline 100 MG tablet Commonly known as:  VIBRA-TABS Take 1 tablet (100 mg total) by  mouth 2 (two) times daily.   ELIQUIS 5 MG Tabs tablet Generic drug:  apixaban TAKE  (1)  TABLET TWICE A DAY.   Fluticasone-Salmeterol 250-50 MCG/DOSE Aepb Commonly known as:  ADVAIR Inhale 1 puff into the lungs 2 (two) times daily.   guaiFENesin 600 MG 12 hr tablet Commonly known as:  MUCINEX Take 600 mg by mouth every morning.   HYDROcodone-homatropine 5-1.5 MG/5ML syrup Commonly known as:  HYCODAN Take 5 mLs by mouth every 6 (six) hours as needed for cough.   Icosapent Ethyl 1 g Caps Commonly known as:  VASCEPA Take 2 g by mouth 2 (two) times daily.   meloxicam 7.5 MG tablet Commonly known as:  MOBIC Take 1 tablet (7.5 mg total) by mouth daily.   metoprolol  tartrate 50 MG tablet Commonly known as:  LOPRESSOR Take 1 tablet (50 mg total) by mouth 2 (two) times daily.   montelukast 10 MG tablet Commonly known as:  SINGULAIR Take 1 tablet (10 mg total) by mouth at bedtime.   ranitidine 150 MG tablet Commonly known as:  ZANTAC Take 1 tablet (150 mg total) by mouth as needed.   tiotropium 18 MCG inhalation capsule Commonly known as:  SPIRIVA Place 1 capsule (18 mcg total) into inhaler and inhale daily.   Vitamin D3 5000 units Caps Take 1 capsule by mouth daily.          Objective:    BP 98/60   Pulse 67   Temp (!) 96.8 F (36 C) (Oral)   Ht 5' 6.5" (1.689 m)   Wt 204 lb 12.8 oz (92.9 kg)   BMI 32.56 kg/m   Allergies  Allergen Reactions  . Penicillins Rash    Has patient had a PCN reaction causing immediate rash, facial/tongue/throat swelling, SOB or lightheadedness with hypotension: Yes Has patient had a PCN reaction causing severe rash involving mucus membranes or skin necrosis: Yes Has patient had a PCN reaction that required hospitalization: No Has patient had a PCN reaction occurring within the last 10 years: No If all of the above answers are "NO", then may proceed with Cephalosporin use.     Physical Exam  Constitutional: She is oriented to person, place, and time. She appears well-developed and well-nourished.  HENT:  Head: Normocephalic and atraumatic.  Eyes: Conjunctivae and EOM are normal. Pupils are equal, round, and reactive to light.  Cardiovascular: Normal rate, normal heart sounds and intact distal pulses. An irregularly irregular rhythm present.  Pulmonary/Chest: Effort normal and breath sounds normal.  Abdominal: Soft. Bowel sounds are normal.  Neurological: She is alert and oriented to person, place, and time. She has normal reflexes.  Skin: Skin is warm and dry. No rash noted.  Psychiatric: She has a normal mood and affect. Her behavior is normal. Judgment and thought content normal.    Results for  orders placed or performed during the hospital encounter of 10/62/69  Basic metabolic panel  Result Value Ref Range   Sodium 140 135 - 145 mmol/L   Potassium 4.0 3.5 - 5.1 mmol/L   Chloride 102 101 - 111 mmol/L   CO2 31 22 - 32 mmol/L   Glucose, Bld 104 (H) 65 - 99 mg/dL   BUN 15 6 - 20 mg/dL   Creatinine, Ser 0.92 0.44 - 1.00 mg/dL   Calcium 9.1 8.9 - 10.3 mg/dL   GFR calc non Af Amer >60 >60 mL/min   GFR calc Af Amer >60 >60 mL/min   Anion gap 7 5 -  15  TSH  Result Value Ref Range   TSH 1.556 0.350 - 4.500 uIU/mL  CBC with Differential/Platelet  Result Value Ref Range   WBC 7.3 4.0 - 10.5 K/uL   RBC 4.20 3.87 - 5.11 MIL/uL   Hemoglobin 12.3 12.0 - 15.0 g/dL   HCT 39.0 36.0 - 46.0 %   MCV 92.9 78.0 - 100.0 fL   MCH 29.3 26.0 - 34.0 pg   MCHC 31.5 30.0 - 36.0 g/dL   RDW 13.7 11.5 - 15.5 %   Platelets 289 150 - 400 K/uL   Neutrophils Relative % 61 %   Neutro Abs 4.5 1.7 - 7.7 K/uL   Lymphocytes Relative 31 %   Lymphs Abs 2.3 0.7 - 4.0 K/uL   Monocytes Relative 5 %   Monocytes Absolute 0.4 0.1 - 1.0 K/uL   Eosinophils Relative 3 %   Eosinophils Absolute 0.2 0.0 - 0.7 K/uL   Basophils Relative 0 %   Basophils Absolute 0.0 0.0 - 0.1 K/uL      Assessment & Plan:   . 1. Mitral valve disease  2. Persistent atrial fibrillation (HCC) Contact Dr. Bronson Ing Consider cardioversion Reduce metoprolol to half tablet twice daily to avoid hypotension. May eat salt for the next few days to bring blood pressure up  3. COPD  GOLD III   Current Outpatient Medications:  .  albuterol (PROAIR HFA) 108 (90 Base) MCG/ACT inhaler, Inhale 2 puffs into the lungs every 6 (six) hours as needed for wheezing or shortness of breath., Disp: 1 Inhaler, Rfl: 11 .  Cholecalciferol (VITAMIN D3) 5000 UNITS CAPS, Take 1 capsule by mouth daily., Disp: , Rfl:  .  diltiazem (CARDIZEM CD) 180 MG 24 hr capsule, Take 1 capsule (180 mg total) daily by mouth., Disp: 90 capsule, Rfl: 3 .  doxycycline  (VIBRA-TABS) 100 MG tablet, Take 1 tablet (100 mg total) by mouth 2 (two) times daily., Disp: 20 tablet, Rfl: 1 .  ELIQUIS 5 MG TABS tablet, TAKE  (1)  TABLET TWICE A DAY., Disp: 60 tablet, Rfl: 3 .  Fluticasone-Salmeterol (ADVAIR) 250-50 MCG/DOSE AEPB, Inhale 1 puff into the lungs 2 (two) times daily., Disp: , Rfl:  .  guaiFENesin (MUCINEX) 600 MG 12 hr tablet, Take 600 mg by mouth every morning. , Disp: , Rfl:  .  HYDROcodone-homatropine (HYCODAN) 5-1.5 MG/5ML syrup, Take 5 mLs by mouth every 6 (six) hours as needed for cough., Disp: 120 mL, Rfl: 0 .  Icosapent Ethyl (VASCEPA) 1 g CAPS, Take 2 g by mouth 2 (two) times daily., Disp: 120 capsule, Rfl: 5 .  meloxicam (MOBIC) 7.5 MG tablet, Take 1 tablet (7.5 mg total) by mouth daily., Disp: 90 tablet, Rfl: 3 .  metoprolol tartrate (LOPRESSOR) 50 MG tablet, Take 1 tablet (50 mg total) by mouth 2 (two) times daily., Disp: 180 tablet, Rfl: 3 .  montelukast (SINGULAIR) 10 MG tablet, Take 1 tablet (10 mg total) by mouth at bedtime. (Patient taking differently: Take 10 mg by mouth every morning. ), Disp: 90 tablet, Rfl: 3 .  ranitidine (ZANTAC) 150 MG tablet, Take 1 tablet (150 mg total) by mouth as needed. (Patient taking differently: Take 150 mg by mouth daily as needed for heartburn. ), Disp: 90 tablet, Rfl: 3 .  tiotropium (SPIRIVA) 18 MCG inhalation capsule, Place 1 capsule (18 mcg total) into inhaler and inhale daily., Disp: 30 capsule, Rfl: 11 Continue all other maintenance medications as listed above.  Follow up plan: Recheck 6 months  Educational handout  given for [place] PA-C Nellie [address]  [place],  [id number] [phone]   09/22/2017, 10:04 AM

## 2017-09-22 NOTE — Telephone Encounter (Signed)
Per phone call pt would like to have cardioversion

## 2017-09-22 NOTE — Telephone Encounter (Signed)
I will inform Dr.Koneswaran and then call patient

## 2017-10-01 NOTE — Patient Instructions (Signed)
[name]  10/01/2017     @PREFPERIOPPHARMACY @   Your procedure is scheduled on 10/08/2017   Report to [facility] at  700   A.M.  Call this number if you have problems the morning of surgery:  [phone]   Remember:  Do not eat food or drink liquids after midnight.  Take these medicines the morning of surgery with A SIP OF WATER : Dr Jacinta Shoe wants you to hold your cardiazem the morning of your procedure. You may take your mobic, metoprolol, singulair and zantac per his instructions. Use your inhalers before you come and bring your rescue inhaler with you if you have one.   Do not wear jewelry, make-up or nail polish.  Do not wear lotions, powders, or perfumes, or deoderant.  Do not shave 48 hours prior to surgery.  Men may shave face and neck.  Do not bring valuables to the hospital.  [facility] is not responsible for any belongings or valuables.  Contacts, dentures or bridgework may not be worn into surgery.  Leave your suitcase in the car.  After surgery it may be brought to your room.  For patients admitted to the hospital, discharge time will be determined by your treatment team.  Patients discharged the day of surgery will not be allowed to drive home.   Name and phone number of your driver:   family Special instructions:  Make sure you have not or do not miss any doses of your eliquis prior to your procedure.  Please read over the following fact sheets that you were given. Anesthesia Post-op Instructions and Care and Recovery After Surgery       Electrical Cardioversion Electrical cardioversion is the delivery of a jolt of electricity to restore a normal rhythm to the heart. A rhythm that is too fast or is not regular keeps the heart from pumping well. In this procedure, sticky patches or metal paddles are placed on the chest to deliver electricity to the heart from a device. This procedure may be done in an emergency if:  There is low or  no blood pressure as a result of the heart rhythm.  Normal rhythm must be restored as fast as possible to protect the brain and heart from further damage.  It may save a life.  This procedure may also be done for irregular or fast heart rhythms that are not immediately life-threatening. Tell a health care provider about:  Any allergies you have.  All medicines you are taking, including vitamins, herbs, eye drops, creams, and over-the-counter medicines.  Any problems you or family members have had with anesthetic medicines.  Any blood disorders you have.  Any surgeries you have had.  Any medical conditions you have.  Whether you are pregnant or may be pregnant. What are the risks? Generally, this is a safe procedure. However, problems may occur, including:  Allergic reactions to medicines.  A blood clot that breaks free and travels to other parts of your body.  The possible return of an abnormal heart rhythm within hours or days after the procedure.  Your heart stopping (cardiac arrest). This is rare.  What happens before the procedure? Medicines  Your health care provider may have you start taking: ? Blood-thinning medicines (anticoagulants) so your blood does not clot as easily. ? Medicines may be given to help stabilize your heart rate and rhythm.  Ask your health care provider about changing or stopping your  regular medicines. This is especially important if you are taking diabetes medicines or blood thinners. General instructions  Plan to have someone take you home from the hospital or clinic.  If you will be going home right after the procedure, plan to have someone with you for 24 hours.  Follow instructions from your health care provider about eating or drinking restrictions. What happens during the procedure?  To lower your risk of infection: ? Your health care team will wash or sanitize their hands. ? Your skin will be washed with soap.  An IV tube will  be inserted into one of your veins.  You will be given a medicine to help you relax (sedative).  Sticky patches (electrodes) or metal paddles may be placed on your chest.  An electrical shock will be delivered. The procedure may vary among health care providers and hospitals. What happens after the procedure?  Your blood pressure, heart rate, breathing rate, and blood oxygen level will be monitored until the medicines you were given have worn off.  Do not drive for 24 hours if you were given a sedative.  Your heart rhythm will be watched to make sure it does not change. This information is not intended to replace advice given to you by your health care provider. Make sure you discuss any questions you have with your health care provider. Document Released: 10/10/2002 Document Revised: 06/18/2016 Document Reviewed: 04/25/2016 Elsevier Interactive Patient Education  2017 [employer].  Electrical Cardioversion, Care After This sheet gives you information about how to care for yourself after your procedure. Your health care provider may also give you more specific instructions. If you have problems or questions, contact your health care provider. What can I expect after the procedure? After the procedure, it is common to have:  Some redness on the skin where the shocks were given.  Follow these instructions at home:  Do not drive for 24 hours if you were given a medicine to help you relax (sedative).  Take over-the-counter and prescription medicines only as told by your health care provider.  Ask your health care provider how to check your pulse. Check it often.  Rest for 48 hours after the procedure or as told by your health care provider.  Avoid or limit your caffeine use as told by your health care provider. Contact a health care provider if:  You feel like your heart is beating too quickly or your pulse is not regular.  You have a serious muscle cramp that does not go  away. Get help right away if:  You have discomfort in your chest.  You are dizzy or you feel faint.  You have trouble breathing or you are short of breath.  Your speech is slurred.  You have trouble moving an arm or leg on one side of your body.  Your fingers or toes turn cold or blue. This information is not intended to replace advice given to you by your health care provider. Make sure you discuss any questions you have with your health care provider. Document Released: 08/10/2013 Document Revised: 05/23/2016 Document Reviewed: 04/25/2016 Elsevier Interactive Patient Education  2018 [place] Anesthesia is a term that refers to techniques, procedures, and medicines that help a person stay safe and comfortable during a medical procedure. Monitored anesthesia care, or sedation, is one type of anesthesia. Your anesthesia specialist may recommend sedation if you will be having a procedure that does not require you to be unconscious, such  as:  Cataract surgery.  A dental procedure.  A biopsy.  A colonoscopy.  During the procedure, you may receive a medicine to help you relax (sedative). There are three levels of sedation:  Mild sedation. At this level, you may feel awake and relaxed. You will be able to follow directions.  Moderate sedation. At this level, you will be sleepy. You may not remember the procedure.  Deep sedation. At this level, you will be asleep. You will not remember the procedure.  The more medicine you are given, the deeper your level of sedation will be. Depending on how you respond to the procedure, the anesthesia specialist may change your level of sedation or the type of anesthesia to fit your needs. An anesthesia specialist will monitor you closely during the procedure. Let your health care provider know about:  Any allergies you have.  All medicines you are taking, including vitamins, herbs, eye drops, creams, and  over-the-counter medicines.  Any use of steroids (by mouth or as a cream).  Any problems you or family members have had with sedatives and anesthetic medicines.  Any blood disorders you have.  Any surgeries you have had.  Any medical conditions you have, such as sleep apnea.  Whether you are pregnant or may be pregnant.  Any use of cigarettes, alcohol, or street drugs. What are the risks? Generally, this is a safe procedure. However, problems may occur, including:  Getting too much medicine (oversedation).  Nausea.  Allergic reaction to medicines.  Trouble breathing. If this happens, a breathing tube may be used to help with breathing. It will be removed when you are awake and breathing on your own.  Heart trouble.  Lung trouble.  Before the procedure Staying hydrated Follow instructions from your health care provider about hydration, which may include:  Up to 2 hours before the procedure - you may continue to drink clear liquids, such as water, clear fruit juice, black coffee, and plain tea.  Eating and drinking restrictions Follow instructions from your health care provider about eating and drinking, which may include:  8 hours before the procedure - stop eating heavy meals or foods such as meat, fried foods, or fatty foods.  6 hours before the procedure - stop eating light meals or foods, such as toast or cereal.  6 hours before the procedure - stop drinking milk or drinks that contain milk.  2 hours before the procedure - stop drinking clear liquids.  Medicines Ask your health care provider about:  Changing or stopping your regular medicines. This is especially important if you are taking diabetes medicines or blood thinners.  Taking medicines such as aspirin and ibuprofen. These medicines can thin your blood. Do not take these medicines before your procedure if your health care provider instructs you not to.  Tests and exams  You will have a physical  exam.  You may have blood tests done to show: ? How well your kidneys and liver are working. ? How well your blood can clot.  General instructions  Plan to have someone take you home from the hospital or clinic.  If you will be going home right after the procedure, plan to have someone with you for 24 hours.  What happens during the procedure?  Your blood pressure, heart rate, breathing, level of pain and overall condition will be monitored.  An IV tube will be inserted into one of your veins.  Your anesthesia specialist will give you medicines as needed to keep you  comfortable during the procedure. This may mean changing the level of sedation.  The procedure will be performed. After the procedure  Your blood pressure, heart rate, breathing rate, and blood oxygen level will be monitored until the medicines you were given have worn off.  Do not drive for 24 hours if you received a sedative.  You may: ? Feel sleepy, clumsy, or nauseous. ? Feel forgetful about what happened after the procedure. ? Have a sore throat if you had a breathing tube during the procedure. ? Vomit. This information is not intended to replace advice given to you by your health care provider. Make sure you discuss any questions you have with your health care provider. Document Released: 07/16/2005 Document Revised: 03/28/2016 Document Reviewed: 02/10/2016 Elsevier Interactive Patient Education  2018 [place], Care After These instructions provide you with information about caring for yourself after your procedure. Your health care provider may also give you more specific instructions. Your treatment has been planned according to current medical practices, but problems sometimes occur. Call your health care provider if you have any problems or questions after your procedure. What can I expect after the procedure? After your procedure, it is common to:  Feel sleepy for several  hours.  Feel clumsy and have poor balance for several hours.  Feel forgetful about what happened after the procedure.  Have poor judgment for several hours.  Feel nauseous or vomit.  Have a sore throat if you had a breathing tube during the procedure.  Follow these instructions at home: For at least 24 hours after the procedure:   Do not: ? Participate in activities in which you could fall or become injured. ? Drive. ? Use heavy machinery. ? Drink alcohol. ? Take sleeping pills or medicines that cause drowsiness. ? Make important decisions or sign legal documents. ? Take care of children on your own.  Rest. Eating and drinking  Follow the diet that is recommended by your health care provider.  If you vomit, drink water, juice, or soup when you can drink without vomiting.  Make sure you have little or no nausea before eating solid foods. General instructions  Have a responsible adult stay with you until you are awake and alert.  Take over-the-counter and prescription medicines only as told by your health care provider.  If you smoke, do not smoke without supervision.  Keep all follow-up visits as told by your health care provider. This is important. Contact a health care provider if:  You keep feeling nauseous or you keep vomiting.  You feel light-headed.  You develop a rash.  You have a fever. Get help right away if:  You have trouble breathing. This information is not intended to replace advice given to you by your health care provider. Make sure you discuss any questions you have with your health care provider. Document Released: 02/10/2016 Document Revised: 06/11/2016 Document Reviewed: 02/10/2016 Elsevier Interactive Patient Education  [employer].

## 2017-10-02 ENCOUNTER — Encounter (HOSPITAL_COMMUNITY)
Admission: RE | Admit: 2017-10-02 | Discharge: 2017-10-02 | Disposition: A | Discharge status: Home / Self Care | Payer: Medicare HMO | Source: Ambulatory Visit | Attending: Cardiovascular Disease | Admitting: Cardiovascular Disease

## 2017-10-02 ENCOUNTER — Other Ambulatory Visit: Payer: Self-pay

## 2017-10-02 ENCOUNTER — Encounter (HOSPITAL_COMMUNITY): Payer: Self-pay

## 2017-10-02 DIAGNOSIS — Z01812 Encounter for preprocedural laboratory examination: Secondary | ICD-10-CM | POA: Diagnosis not present

## 2017-10-02 HISTORY — DX: Gastro-esophageal reflux disease without esophagitis: K21.9

## 2017-10-02 LAB — BASIC METABOLIC PANEL
Anion gap: 8 (ref 5–15)
BUN: 18 mg/dL (ref 6–20)
CHLORIDE: 101 mmol/L (ref 101–111)
CO2: 27 mmol/L (ref 22–32)
Calcium: 8.8 mg/dL — ABNORMAL LOW (ref 8.9–10.3)
Creatinine, Ser: 0.75 mg/dL (ref 0.44–1.00)
GFR calc Af Amer: >60 mL/min (ref >60)
GFR calc non Af Amer: >60 mL/min (ref >60)
GLUCOSE: 99 mg/dL (ref 65–99)
POTASSIUM: 4.1 mmol/L (ref 3.5–5.1)
Sodium: 136 mmol/L (ref 135–145)

## 2017-10-02 LAB — CBC WITH DIFFERENTIAL/PLATELET
Basophils Absolute: 0 10*3/uL (ref 0.0–0.1)
Basophils Relative: 0 %
EOS PCT: 4 %
Eosinophils Absolute: 0.3 10*3/uL (ref 0.0–0.7)
HEMATOCRIT: 41.4 % (ref 36.0–46.0)
HEMOGLOBIN: 13.1 g/dL (ref 12.0–15.0)
LYMPHS ABS: 2.1 10*3/uL (ref 0.7–4.0)
LYMPHS PCT: 34 %
MCH: 29.2 pg (ref 26.0–34.0)
MCHC: 31.6 g/dL (ref 30.0–36.0)
MCV: 92.2 fL (ref 78.0–100.0)
Monocytes Absolute: 0.3 10*3/uL (ref 0.1–1.0)
Monocytes Relative: 5 %
NEUTROS PCT: 57 %
Neutro Abs: 3.5 10*3/uL (ref 1.7–7.7)
Platelets: 305 10*3/uL (ref 150–400)
RBC: 4.49 MIL/uL (ref 3.87–5.11)
RDW: 13.6 % (ref 11.5–15.5)
WBC: 6.2 10*3/uL (ref 4.0–10.5)

## 2017-10-08 ENCOUNTER — Encounter (HOSPITAL_COMMUNITY): Payer: Self-pay | Admitting: Anesthesiology

## 2017-10-08 ENCOUNTER — Encounter (HOSPITAL_COMMUNITY)
Admission: RE | Disposition: A | Discharge status: Home / Self Care | Payer: Self-pay | Source: Ambulatory Visit | Attending: Cardiovascular Disease

## 2017-10-08 ENCOUNTER — Ambulatory Visit (HOSPITAL_COMMUNITY): Payer: Medicare HMO | Admitting: Anesthesiology

## 2017-10-08 ENCOUNTER — Ambulatory Visit (HOSPITAL_COMMUNITY)
Admission: RE | Admit: 2017-10-08 | Discharge: 2017-10-08 | Disposition: A | Discharge status: Home / Self Care | Payer: Medicare HMO | Source: Ambulatory Visit | Attending: Cardiovascular Disease | Admitting: Cardiovascular Disease

## 2017-10-08 ENCOUNTER — Telehealth: Payer: Self-pay

## 2017-10-08 ENCOUNTER — Other Ambulatory Visit: Payer: Self-pay

## 2017-10-08 DIAGNOSIS — I341 Nonrheumatic mitral (valve) prolapse: Secondary | ICD-10-CM | POA: Diagnosis not present

## 2017-10-08 DIAGNOSIS — I340 Nonrheumatic mitral (valve) insufficiency: Secondary | ICD-10-CM | POA: Insufficient documentation

## 2017-10-08 DIAGNOSIS — J449 Chronic obstructive pulmonary disease, unspecified: Secondary | ICD-10-CM | POA: Diagnosis not present

## 2017-10-08 DIAGNOSIS — I4891 Unspecified atrial fibrillation: Secondary | ICD-10-CM | POA: Diagnosis not present

## 2017-10-08 DIAGNOSIS — I4892 Unspecified atrial flutter: Secondary | ICD-10-CM | POA: Diagnosis not present

## 2017-10-08 DIAGNOSIS — Z79899 Other long term (current) drug therapy: Secondary | ICD-10-CM | POA: Diagnosis not present

## 2017-10-08 DIAGNOSIS — Z7901 Long term (current) use of anticoagulants: Secondary | ICD-10-CM | POA: Insufficient documentation

## 2017-10-08 DIAGNOSIS — I481 Persistent atrial fibrillation: Secondary | ICD-10-CM | POA: Diagnosis not present

## 2017-10-08 DIAGNOSIS — Z87891 Personal history of nicotine dependence: Secondary | ICD-10-CM | POA: Insufficient documentation

## 2017-10-08 DIAGNOSIS — E7800 Pure hypercholesterolemia, unspecified: Secondary | ICD-10-CM | POA: Insufficient documentation

## 2017-10-08 DIAGNOSIS — Z90710 Acquired absence of both cervix and uterus: Secondary | ICD-10-CM | POA: Diagnosis not present

## 2017-10-08 DIAGNOSIS — Z880 Allergy status to penicillin: Secondary | ICD-10-CM | POA: Insufficient documentation

## 2017-10-08 DIAGNOSIS — I480 Paroxysmal atrial fibrillation: Secondary | ICD-10-CM | POA: Diagnosis not present

## 2017-10-08 HISTORY — PX: CARDIOVERSION: SHX1299

## 2017-10-08 SURGERY — CARDIOVERSION
Anesthesia: Monitor Anesthesia Care

## 2017-10-08 MED ORDER — PROPOFOL 10 MG/ML IV BOLUS
INTRAVENOUS | Status: AC
  Filled 2017-10-08: qty 20

## 2017-10-08 MED ORDER — PROPOFOL 10 MG/ML IV BOLUS
INTRAVENOUS | Status: DC | PRN
  Administered 2017-10-08 (×2): 20 mg via INTRAVENOUS

## 2017-10-08 MED ORDER — BUTAMBEN-TETRACAINE-BENZOCAINE 2-2-14 % EX AERO
INHALATION_SPRAY | CUTANEOUS | Status: AC
  Filled 2017-10-08: qty 5

## 2017-10-08 MED ORDER — LACTATED RINGERS IV SOLN
INTRAVENOUS | Status: DC
  Administered 2017-10-08: 08:00:00 via INTRAVENOUS

## 2017-10-08 MED ORDER — ONDANSETRON 4 MG PO TBDP
ORAL_TABLET | ORAL | Status: AC
  Filled 2017-10-08: qty 1

## 2017-10-08 MED ORDER — PROPOFOL 500 MG/50ML IV EMUL
INTRAVENOUS | Status: DC | PRN
  Administered 2017-10-08: 75 ug/kg/min via INTRAVENOUS

## 2017-10-08 MED ORDER — SODIUM CHLORIDE BACTERIOSTATIC 0.9 % IJ SOLN
INTRAMUSCULAR | Status: AC
  Filled 2017-10-08: qty 20

## 2017-10-08 MED ORDER — PHENYLEPHRINE 40 MCG/ML (10ML) SYRINGE FOR IV PUSH (FOR BLOOD PRESSURE SUPPORT)
PREFILLED_SYRINGE | INTRAVENOUS | Status: AC
  Filled 2017-10-08: qty 10

## 2017-10-08 MED ORDER — MIDAZOLAM HCL 2 MG/2ML IJ SOLN
1.0000 mg | Freq: Once | INTRAMUSCULAR | Status: AC | PRN
  Administered 2017-10-08: 2 mg via INTRAVENOUS
  Filled 2017-10-08: qty 2

## 2017-10-08 MED ORDER — AMIODARONE HCL 200 MG PO TABS: 200.0000 mg | ORAL_TABLET | Freq: Every day | ORAL | 3 refills | Status: DC

## 2017-10-08 MED ORDER — PROPOFOL 10 MG/ML IV BOLUS
INTRAVENOUS | Status: AC
  Filled 2017-10-08: qty 40

## 2017-10-08 MED ORDER — ONDANSETRON 4 MG PO TBDP
4.0000 mg | ORAL_TABLET | Freq: Once | ORAL | Status: AC
  Administered 2017-10-08: 4 mg via ORAL

## 2017-10-08 NOTE — Procedures (Signed)
Elective direct current cardioversion  Indication: Persistent atrial fibrillation and flutter  Description of procedure: After informed consent was obtained and preprocedure evaluation by Anesthesia, patient was taken to the procedure room. Timeout performed. Sedation was managed completely by the Anesthesia service, please refer to their documentation for details. Anterior and posterior chest pads were placed and connected to a biphasic defibrillator. A single synchronized shock of 120J was delivered resulting in restoration of sinus rhythm. The patient remained hemodynamically stable throughout, and there were no immediate complications noted. Follow-up ECG obtained.  Medication changes: I will stop diltiazem and metoprolol. I will start amiodarone 200 mg bid x one week, then 200 mg daily. Continue Eliquis 5 mg bid.  I will arrange for her to follow up with me in one month.   Kate Sable, M.D., F.A.C.C.

## 2017-10-08 NOTE — Discharge Instructions (Signed)
STOP CARDIZEM AND METOPROLOL START AMIODARONE (PACERONE) 200 MG TWICE A DAY FOR 2 WEEKS THEN ONCE A DAY THEREAFTER CONTINUE ON ELIQUIS OFFICE WILL CALL YOU WITH A 1 MONTH FOLLOW UP  Amiodarone tablets What is this medicine? AMIODARONE (a MEE oh da rone) is an antiarrhythmic drug. It helps make your heart beat regularly. Because of the side effects caused by this medicine, it is only used when other medicines have not worked. It is usually used for heartbeat problems that may be life threatening. This medicine may be used for other purposes; ask your health care provider or pharmacist if you have questions. COMMON BRAND NAME(S): Cordarone, Pacerone What should I tell my health care provider before I take this medicine? They need to know if you have any of these conditions: -liver disease -lung disease -other heart problems -thyroid disease -an unusual or allergic reaction to amiodarone, iodine, other medicines, foods, dyes, or preservatives -pregnant or trying to get pregnant -breast-feeding How should I use this medicine? Take this medicine by mouth with a glass of water. Follow the directions on the prescription label. You can take this medicine with or without food. However, you should always take it the same way each time. Take your doses at regular intervals. Do not take your medicine more often than directed. Do not stop taking except on the advice of your doctor or health care professional. A special MedGuide will be given to you by the pharmacist with each prescription and refill. Be sure to read this information carefully each time. Talk to your pediatrician regarding the use of this medicine in children. Special care may be needed. Overdosage: If you think you have taken too much of this medicine contact a poison control center or emergency room at once. NOTE: This medicine is only for you. Do not share this medicine with others. What if I miss a dose? If you miss a dose, take it  as soon as you can. If it is almost time for your next dose, take only that dose. Do not take double or extra doses. What may interact with this medicine? Do not take this medicine with any of the following medications: -abarelix -apomorphine -arsenic trioxide -certain antibiotics like erythromycin, gemifloxacin, levofloxacin, pentamidine -certain medicines for depression like amoxapine, tricyclic antidepressants -certain medicines for fungal infections like fluconazole, itraconazole, ketoconazole, posaconazole, voriconazole -certain medicines for irregular heart beat like disopyramide, dofetilide, dronedarone, ibutilide, propafenone, sotalol -certain medicines for malaria like chloroquine, halofantrine -cisapride -droperidol -haloperidol -hawthorn -maprotiline -methadone -phenothiazines like chlorpromazine, mesoridazine, thioridazine -pimozide -ranolazine -red yeast rice -vardenafil -ziprasidone This medicine may also interact with the following medications: -antiviral medicines for HIV or AIDS -certain medicines for blood pressure, heart disease, irregular heart beat -certain medicines for cholesterol like atorvastatin, cerivastatin, lovastatin, simvastatin -certain medicines for hepatitis C like sofosbuvir and ledipasvir; sofosbuvir -certain medicines for seizures like phenytoin -certain medicines for thyroid problems -certain medicines that treat or prevent blood clots like warfarin -cholestyramine -cimetidine -clopidogrel -cyclosporine -dextromethorphan -diuretics -fentanyl -general anesthetics -grapefruit juice -lidocaine -loratadine -methotrexate -other medicines that prolong the QT interval (cause an abnormal heart rhythm) -procainamide -quinidine -rifabutin, rifampin, or rifapentine -St. John's Wort -trazodone This list may not describe all possible interactions. Give your health care provider a list of all the medicines, herbs, non-prescription drugs, or  dietary supplements you use. Also tell them if you smoke, drink alcohol, or use illegal drugs. Some items may interact with your medicine. What should I watch for while using this medicine? Your condition  will be monitored closely when you first begin therapy. Often, this drug is first started in a hospital or other monitored health care setting. Once you are on maintenance therapy, visit your doctor or health care professional for regular checks on your progress. Because your condition and use of this medicine carry some risk, it is a good idea to carry an identification card, necklace or bracelet with details of your condition, medications, and doctor or health care professional. Dennis Bast may get drowsy or dizzy. Do not drive, use machinery, or do anything that needs mental alertness until you know how this medicine affects you. Do not stand or sit up quickly, especially if you are an older patient. This reduces the risk of dizzy or fainting spells. This medicine can make you more sensitive to the sun. Keep out of the sun. If you cannot avoid being in the sun, wear protective clothing and use sunscreen. Do not use sun lamps or tanning beds/booths. You should have regular eye exams before and during treatment. Call your doctor if you have blurred vision, see halos, or your eyes become sensitive to light. Your eyes may get dry. It may be helpful to use a lubricating eye solution or artificial tears solution. If you are going to have surgery or a procedure that requires contrast dyes, tell your doctor or health care professional that you are taking this medicine. What side effects may I notice from receiving this medicine? Side effects that you should report to your doctor or health care professional as soon as possible: -allergic reactions like skin rash, itching or hives, swelling of the face, lips, or tongue -blue-gray coloring of the skin -blurred vision, seeing blue green halos, increased sensitivity of  the eyes to light -breathing problems -chest pain -dark urine -fast, irregular heartbeat -feeling faint or light-headed -intolerance to heat or cold -nausea or vomiting -pain and swelling of the scrotum -pain, tingling, numbness in feet, hands -redness, blistering, peeling or loosening of the skin, including inside the mouth -spitting up blood -stomach pain -sweating -unusual or uncontrolled movements of body -unusually weak or tired -weight gain or loss -yellowing of the eyes or skin Side effects that usually do not require medical attention (report to your doctor or health care professional if they continue or are bothersome): -change in sex drive or performance -constipation -dizziness -headache -loss of appetite -trouble sleeping This list may not describe all possible side effects. Call your doctor for medical advice about side effects. You may report side effects to FDA at 1-800-FDA-1088. Where should I keep my medicine? Keep out of the reach of children. Store at room temperature between 20 and 25 degrees C (68 and 77 degrees F). Protect from light. Keep container tightly closed. Throw away any unused medicine after the expiration date. NOTE: This sheet is a summary. It may not cover all possible information. If you have questions about this medicine, talk to your doctor, pharmacist, or health care provider.  2018 Elsevier/Gold Standard (2014-01-23 19:48:11)     Electrical Cardioversion, Care After This sheet gives you information about how to care for yourself after your procedure. Your health care provider may also give you more specific instructions. If you have problems or questions, contact your health care provider. What can I expect after the procedure? After the procedure, it is common to have:  Some redness on the skin where the shocks were given.  Follow these instructions at home:  Do not drive for 24 hours if you were given  a medicine to help you relax  (sedative).  Take over-the-counter and prescription medicines only as told by your health care provider.  Ask your health care provider how to check your pulse. Check it often.  Rest for 48 hours after the procedure or as told by your health care provider.  Avoid or limit your caffeine use as told by your health care provider. Contact a health care provider if:  You feel like your heart is beating too quickly or your pulse is not regular.  You have a serious muscle cramp that does not go away. Get help right away if:  You have discomfort in your chest.  You are dizzy or you feel faint.  You have trouble breathing or you are short of breath.  Your speech is slurred.  You have trouble moving an arm or leg on one side of your body.  Your fingers or toes turn cold or blue. This information is not intended to replace advice given to you by your health care provider. Make sure you discuss any questions you have with your health care provider. Document Released: 08/10/2013 Document Revised: 05/23/2016 Document Reviewed: 04/25/2016 Elsevier Interactive Patient Education  2018 [place] POST-ANESTHESIA  IMMEDIATELY FOLLOWING SURGERY:  Do not drive or operate machinery for the first twenty four hours after surgery.  Do not make any important decisions for twenty four hours after surgery or while taking narcotic pain medications or sedatives.  If you develop intractable nausea and vomiting or a severe headache please notify your doctor immediately.  FOLLOW-UP:  Please make an appointment with your surgeon as instructed. You do not need to follow up with anesthesia unless specifically instructed to do so.  WOUND CARE INSTRUCTIONS (if applicable):  Keep a dry clean dressing on the anesthesia/puncture wound site if there is drainage.  Once the wound has quit draining you may leave it open to air.  Generally you should leave the bandage intact for twenty four hours  unless there is drainage.  If the epidural site drains for more than 36-48 hours please call the anesthesia department.  QUESTIONS?:  Please feel free to call your physician or the hospital operator if you have any questions, and they will be happy to assist you.

## 2017-10-08 NOTE — Telephone Encounter (Signed)
I spoke with patient, she was already aware of medication changes and f/u apt scheduled for the 11/23/16

## 2017-10-08 NOTE — Anesthesia Postprocedure Evaluation (Signed)
Anesthesia Post Note  Patient: [name]  Procedure(s) Performed: CARDIOVERSION (N/A )  Patient location during evaluation: PACU Anesthesia Type: MAC Level of consciousness: awake and alert Pain management: pain level controlled Vital Signs Assessment: post-procedure vital signs reviewed and stable Respiratory status: spontaneous breathing and patient connected to nasal cannula oxygen Postop Assessment: no apparent nausea or vomiting Anesthetic complications: no     Last Vitals:  Vitals:   10/08/17 0855 10/08/17 0900  BP:  132/78  Pulse:    Resp: 18 (!) 24  Temp:    SpO2: 97% 97%    Last Pain:  Vitals:   10/08/17 0738  TempSrc: Oral                 [name]

## 2017-10-08 NOTE — Telephone Encounter (Signed)
-----   Message from Herminio Commons, MD sent at 10/08/2017  9:18 AM EST ----- Regarding: Med changes today I just cardioverted her. Continue Eliquis. Stop diltiazem and metoprolol. Start amiodarone 200 mg bid x 1 week, then 200 mg daily.  Have her follow up with me in a month.  Dr. Bronson Ing

## 2017-10-08 NOTE — Progress Notes (Signed)
Electrical Cardioversion Procedure Note [name] [id number] September 17, 1951  Procedure: Electrical Cardioversion Indications:  Atrial Flutter  Procedure Details Consent: Risks of procedure as well as the alternatives and risks of each were explained to the (patient/caregiver).  Consent for procedure obtained. Time Out: Verified patient identification, verified procedure, site/side was marked, verified correct patient position, special equipment/implants available, medications/allergies/relevent history reviewed, required imaging and test results available.  Performed  Patient placed on cardiac monitor, pulse oximetry, supplemental oxygen as necessary.  Sedation given: See Anesthesia notes Pacer pads placed anterior and posterior chest.  Cardioverted 1 time(s).  Cardioverted at 120J.  Evaluation Findings: Post procedure EKG shows: NSR Complications: None Patient did tolerate procedure well.   [name] 10/08/2017, 9:26 AM

## 2017-10-08 NOTE — Anesthesia Preprocedure Evaluation (Signed)
Anesthesia Evaluation  Patient identified by MRN, date of birth, ID band Patient awake    Airway Mallampati: I  TM Distance: >3 FB Neck ROM: Full    Dental  (+) Edentulous Upper, Edentulous Lower   Pulmonary COPD,  COPD inhaler, former smoker,  severe   Pulmonary exam normal breath sounds clear to auscultation       Cardiovascular Exercise Tolerance: Poor + dysrhythmias Atrial Fibrillation  Rhythm:Regular Rate:Normal  Sinus  Tachycardia  -Inferior ST-elevation -repolarization variant.   -  Negative T-waves  -May be normal -possible  Anteroseptal/anterior ischemia.   (Aug 25, 2017)  - Left ventricle: The cavity size was normal. Wall thickness was   normal. Systolic function was normal. The estimated ejection   fraction was in the range of 60% to 65%. Wall motion was normal;   there were no regional wall motion abnormalities. Left   ventricular diastolic function parameters were normal. - Aortic valve: Trileaflet; mildly thickened leaflets. - Mitral valve: There was mild regurgitation.    (July 2018)  asymptomatic   Neuro/Psych    GI/Hepatic GERD  Medicated and Controlled,  Endo/Other    Renal/GU      Musculoskeletal  (+) Arthritis ,   Abdominal Normal abdominal exam  (+)   Peds  Hematology  (+) anemia ,   Anesthesia Other Findings   Reproductive/Obstetrics                             Anesthesia Physical Anesthesia Plan  ASA: III  Anesthesia Plan: MAC   Post-op Pain Management:    Induction:   PONV Risk Score and Plan:   Airway Management Planned: Simple Face Mask  Additional Equipment:   Intra-op Plan:   Post-operative Plan:   Informed Consent: I have reviewed the patients History and Physical, chart, labs and discussed the procedure including the risks, benefits and alternatives for the proposed anesthesia with the patient or authorized representative who has indicated  his/her understanding and acceptance.     Plan Discussed with: CRNA  Anesthesia Plan Comments:         Anesthesia Quick Evaluation

## 2017-10-08 NOTE — Interval H&P Note (Signed)
History and Physical Interval Note: No changes. Will proceed with cardioversion.  10/08/2017 8:21 AM  [name]  has presented today for surgery, with the diagnosis of a-fib  The various methods of treatment have been discussed with the patient and family. After consideration of risks, benefits and other options for treatment, the patient has consented to  Procedure(s): CARDIOVERSION (N/A) as a surgical intervention .  The patient's history has been reviewed, patient examined, no change in status, stable for surgery.  I have reviewed the patient's chart and labs.  Questions were answered to the patient's satisfaction.     Kate Sable

## 2017-10-08 NOTE — Transfer of Care (Signed)
Immediate Anesthesia Transfer of Care Note  Patient: [name]  Procedure(s) Performed: CARDIOVERSION (N/A )  Patient Location: PACU  Anesthesia Type:MAC  Level of Consciousness: awake, alert  and patient cooperative  Airway & Oxygen Therapy: Patient Spontanous Breathing Nasal CAnnula  Post-op Assessment: Report given to RN and Post -op Vital signs reviewed and stable  Post vital signs: Reviewed and stable  Last Vitals:  Vitals:   10/08/17 0855 10/08/17 0900  BP:  132/78  Pulse:    Resp: 18 (!) 24  Temp:    SpO2: 97% 97%    Last Pain:  Vitals:   10/08/17 0738  TempSrc: Oral         Complications: No apparent anesthesia complications

## 2017-10-09 ENCOUNTER — Encounter (HOSPITAL_COMMUNITY): Payer: Self-pay | Admitting: Cardiovascular Disease

## 2017-10-09 ENCOUNTER — Telehealth: Payer: Self-pay | Admitting: Physician Assistant

## 2017-10-09 NOTE — Telephone Encounter (Signed)
Paged by answering service. Patient not feeling well since cardioversion yesterday. Slept all night. Fatigue today. BP 138/85 HR 125. HR running in 120s for past few hours. Has headache. No chest pain, speaking difficulty or one side of weakness.   Advised to restart Metoprolol 50mg  BID. Continue Amiodarone and Elqiuis. She will take tylenol for headache. Will come to ER if no improvement.

## 2017-10-12 ENCOUNTER — Encounter: Payer: Self-pay | Admitting: Cardiovascular Disease

## 2017-10-12 ENCOUNTER — Telehealth: Payer: Self-pay | Admitting: *Deleted

## 2017-10-12 DIAGNOSIS — R000 Tachycardia, unspecified: Secondary | ICD-10-CM

## 2017-10-12 NOTE — Telephone Encounter (Signed)
Dizziness when she stands - pt scheduled to see Dr Bronson Ing on 1/11 - will forward to provider pt aware that office closed and that may not get immediate response until open but to report to ED with worsening symptoms - pt voiced understanding

## 2017-10-12 NOTE — Telephone Encounter (Signed)
Per Mychart message, called pt regarding "heart pounding" pt says she restarted metoprolol 25 mg bid (per 12/7 phone note) pt was advised to start 50 mg bid however pt wanted to try lower dose at first. Says HR this morning was 68. Over the last few days has been between 80-128 BP WNL - denies SOB/chest pain/swelling - says has some dizziness when shestna

## 2017-10-12 NOTE — Telephone Encounter (Signed)
Can continue metoprolol 25 mg bid for now if she is asymptomatic. She needs to see EP. If symptoms worsen and she is persistently tachycardic, go to [facility] ED so she can be evaluated by EP while hospitalized.

## 2017-10-13 NOTE — Telephone Encounter (Signed)
Pt aware and voiced understanding - says she will continue to monitor symptoms and HR - denies any symptoms at this time. Will place orders for EP referral and forward to schedulers.

## 2017-10-16 ENCOUNTER — Ambulatory Visit (INDEPENDENT_AMBULATORY_CARE_PROVIDER_SITE_OTHER): Payer: Medicare HMO | Admitting: Internal Medicine

## 2017-10-16 ENCOUNTER — Encounter: Payer: Self-pay | Admitting: Internal Medicine

## 2017-10-16 VITALS — BP 122/82 | HR 90 | Ht 66.0 in | Wt 206.8 lb

## 2017-10-16 DIAGNOSIS — I481 Persistent atrial fibrillation: Secondary | ICD-10-CM

## 2017-10-16 DIAGNOSIS — I483 Typical atrial flutter: Secondary | ICD-10-CM | POA: Diagnosis not present

## 2017-10-16 DIAGNOSIS — I4819 Other persistent atrial fibrillation: Secondary | ICD-10-CM

## 2017-10-16 NOTE — Patient Instructions (Addendum)
Medication Instructions:  Your physician recommends that you continue on your current medications as directed. Please refer to the Current Medication list given to you today.   Labwork: Your physician recommends that you return for lab work on 12/20 at [place] do not have to fast   Testing/Procedures:  Your physician has requested that you have a TEE. During a TEE, sound waves are used to create images of your heart. It provides your doctor with information about the size and shape of your heart and how well your heart's chambers and valves are working. In this test, a transducer is attached to the end of a flexible tube that's guided down your throat and into your esophagus (the tube leading from you mouth to your stomach) to get a more detailed image of your heart. You are not awake for the procedure. Please see the instruction sheet given to you today. For further information please visit [email or web address]    Your physician has recommended that you have an ablation. Catheter ablation is a medical procedure used to treat some cardiac arrhythmias (irregular heartbeats). During catheter ablation, a long, thin, flexible tube is put into a blood vessel in your groin (upper thigh), or neck. This tube is called an ablation catheter. It is then guided to your heart through the blood vessel. Radio frequency waves destroy small areas of heart tissue where abnormal heartbeats may cause an arrhythmia to start. Please see the instruction sheet given to you today.--11/05/17  Please arrive at [facility] at 6:30am Do not eat or drink after midnight the night prior to the procedure Do not take any medications the morning of the test Plan for one night stay Will need someone to drive you home at discharge     Follow-Up:  Your physician recommends that you schedule a follow-up appointment in: 4 weeks from 11/05/17 with Roderic Palau, NP and 3 months from  11/05/17 with Dr Rayann Heman

## 2017-10-16 NOTE — Progress Notes (Signed)
Electrophysiology Office Note   Date:  10/16/2017   ID:  [name], DOB 03/09/1951, MRN [id number]  PCP:  Terald Sleeper, PA-C  Cardiologist:  Dr Bronson Ing Primary Electrophysiologist: Thompson Grayer, MD    CC: afib   History of Present Illness: [name] is a 66 y.o. female who presents today for electrophysiology evaluation.   The patient is referred by Dr Bronson Ing for EP consultation regarding atrial fibrillation management.  The patient has a long history of atrial fibrillation and atrial flutter.  She has failed medical therapy with amiodarone.  He has fatigue and decreased exercise tolerance with her afib.  She is unaware of triggers/ precipitants for her afib.  She has chronic lung disease.  SOB is stable.   Today, she denies symptoms of  chest pain, orthopnea, PND, lower extremity edema, claudication, dizziness, presyncope, syncope, bleeding, or neurologic sequela. The patient is tolerating medications without difficulties and is otherwise without complaint today.    Past Medical History:  Diagnosis Date  . COPD (chronic obstructive pulmonary disease) ([place])   . GERD (gastroesophageal reflux disease)   . Hypercholesteremia   . Mild mitral regurgitation   . Mitral valve prolapse    a. dx 1985, not seen on echoes more recently.  . Obesity   . Paroxysmal atrial fibrillation ([facility])   . Paroxysmal atrial flutter ([place])    a. dx 05/2017.   Past Surgical History:  Procedure Laterality Date  . ABDOMINAL HYSTERECTOMY    . CARDIOVERSION N/A 10/08/2017   Procedure: CARDIOVERSION;  Surgeon: Herminio Commons, MD;  Location: [facility];  Service: Cardiovascular;  Laterality: N/A;  . CESAREAN SECTION    . TUBAL LIGATION       Current Outpatient Medications  Medication Sig Dispense Refill  . albuterol (PROAIR HFA) 108 (90 Base) MCG/ACT inhaler Inhale 2 puffs into the lungs every 6 (six) hours as needed for wheezing or shortness of breath. 1 Inhaler 11  .  amiodarone (PACERONE) 200 MG tablet Take 1 tablet (200 mg total) by mouth daily. 200 mg twice a day for two weeks then 1 tablet by mouth daily 45 tablet 3  . Cholecalciferol (VITAMIN D3) 5000 UNITS CAPS Take 5,000 Units by mouth daily.     Marland Kitchen CRANBERRY PO Take 4,200 mg by mouth daily.    Marland Kitchen doxycycline (VIBRA-TABS) 100 MG tablet Take 1 tablet (100 mg total) by mouth 2 (two) times daily. 20 tablet 1  . ELIQUIS 5 MG TABS tablet TAKE  (1)  TABLET TWICE A DAY. 60 tablet 3  . Fluticasone-Salmeterol (ADVAIR) 250-50 MCG/DOSE AEPB Inhale 1 puff into the lungs 2 (two) times daily.    Marland Kitchen guaiFENesin (MUCINEX) 600 MG 12 hr tablet Take 600 mg by mouth every morning.     Marland Kitchen HYDROcodone-homatropine (HYCODAN) 5-1.5 MG/5ML syrup Take 5 mLs by mouth every 6 (six) hours as needed for cough. 120 mL 0  . Icosapent Ethyl (VASCEPA) 1 g CAPS Take 2 g by mouth 2 (two) times daily. 120 capsule 5  . meloxicam (MOBIC) 7.5 MG tablet Take 1 tablet (7.5 mg total) by mouth daily. 90 tablet 3  . montelukast (SINGULAIR) 10 MG tablet Take 1 tablet (10 mg total) by mouth at bedtime. 90 tablet 3  . ranitidine (ZANTAC) 150 MG tablet Take 150 mg by mouth daily as needed for heartburn.    . tiotropium (SPIRIVA) 18 MCG inhalation capsule Place 1 capsule (18 mcg total) into inhaler and inhale daily. 30 capsule  11   No current facility-administered medications for this visit.     Allergies:   Penicillins   Social History:  The patient  reports that she quit smoking about 27 years ago. Her smoking use included cigarettes. She has a 20.00 pack-year smoking history. she has never used smokeless tobacco. She reports that she does not drink alcohol or use drugs.   Family History:  The patient's  family history includes Asthma in her mother; Atrial fibrillation in her brother; Emphysema in her father; Heart attack (age of onset: 64) in her mother; Hypertension in her son.    ROS:  Please see the history of present illness.   All other systems  are personally reviewed and negative.    PHYSICAL EXAM: VS:  BP 122/82   Pulse 90   Ht 5\' 6"  (1.676 m)   Wt 206 lb 12.8 oz (93.8 kg)   SpO2 91%   BMI 33.38 kg/m  , BMI Body mass index is 33.38 kg/m. GEN: Well nourished, well developed, in no acute distress  HEENT: normal  Neck: no JVD, carotid bruits, or masses Cardiac: iRRR; no murmurs, rubs, or gallops,no edema  Respiratory:  clear to auscultation bilaterally, normal work of breathing GI: soft, nontender, nondistended, + BS MS: no deformity or atrophy  Skin: warm and dry  Neuro:  Strength and sensation are intact Psych: euthymic mood, full affect  EKG:  EKG is ordered today. The ekg ordered today is personally reviewed and shows typical appearing atrial flutter, V rate 90 bpm   Recent Labs: 04/01/2017: ALT 15 08/25/2017: TSH 1.556 10/02/2017: BUN 18; Creatinine, Ser 0.75; Hemoglobin 13.1; Platelets 305; Potassium 4.1; Sodium 136  personally reviewed   Lipid Panel     Component Value Date/Time   CHOL 196 04/01/2017 1002   TRIG 161 (H) 04/01/2017 1002   HDL 45 04/01/2017 1002   CHOLHDL 4.4 04/01/2017 1002   LDLCALC 119 (H) 04/01/2017 1002   personally reviewed   Wt Readings from Last 3 Encounters:  10/16/17 206 lb 12.8 oz (93.8 kg)  10/02/17 211 lb (95.7 kg)  09/22/17 204 lb 12.8 oz (92.9 kg)      Other studies personally reviewed: Additional studies/ records that were reviewed today include: Dr Court Joy notes, recent cardioversion/ telephone notes, echo 05/13/17 Review of the above records today demonstrates:  EF 60%, mild MR   ASSESSMENT AND PLAN:  1.  Persistent atrial fibrillation/ atypical atrial flutter She has refractory symptomatic atrial arrhythmias.  She has failed medical therapy with amiodarone. Therapeutic strategies for afib/atrail flutter including medicine and ablation were discussed in detail with the patient today. Risk, benefits, and alternatives to EP study and radiofrequency ablation  were also discussed in detail today. These risks include but are not limited to stroke, bleeding, vascular damage, tamponade, perforation, damage to the esophagus, lungs, and other structures, pulmonary vein stenosis, worsening renal function, and death. The patient understands these risk and wishes to proceed.  We will therefore proceed with catheter ablation at t he next available time.  Will plan TEE prior to ablation.   Current medicines are reviewed at length with the patient today.   The patient does not have concerns regarding her medicines.  The following changes were made today:  none    Signed, Thompson Grayer, MD  10/16/2017 12:02 PM     [address] [place] [address] [phone] (office) [phone] (fax)

## 2017-10-22 ENCOUNTER — Other Ambulatory Visit: Payer: Medicare HMO | Admitting: *Deleted

## 2017-10-22 DIAGNOSIS — I483 Typical atrial flutter: Secondary | ICD-10-CM | POA: Diagnosis not present

## 2017-10-22 DIAGNOSIS — I481 Persistent atrial fibrillation: Secondary | ICD-10-CM | POA: Diagnosis not present

## 2017-10-22 DIAGNOSIS — I4819 Other persistent atrial fibrillation: Secondary | ICD-10-CM

## 2017-10-22 LAB — BASIC METABOLIC PANEL
BUN / CREAT RATIO: 21 (ref 12–28)
BUN: 18 mg/dL (ref 8–27)
CHLORIDE: 102 mmol/L (ref 96–106)
CO2: 29 mmol/L (ref 20–29)
Calcium: 9.1 mg/dL (ref 8.7–10.3)
Creatinine, Ser: 0.85 mg/dL (ref 0.57–1.00)
GFR, EST AFRICAN AMERICAN: 81 mL/min/{1.73_m2} (ref >59)
GFR, EST NON AFRICAN AMERICAN: 71 mL/min/{1.73_m2} (ref >59)
Glucose: 98 mg/dL (ref 65–99)
POTASSIUM: 4.8 mmol/L (ref 3.5–5.2)
SODIUM: 142 mmol/L (ref 134–144)

## 2017-10-22 LAB — CBC WITH DIFFERENTIAL/PLATELET
BASOS: 0 %
Basophils Absolute: 0 10*3/uL (ref 0.0–0.2)
EOS (ABSOLUTE): 0.2 10*3/uL (ref 0.0–0.4)
EOS: 3 %
HEMATOCRIT: 38.6 % (ref 34.0–46.6)
Hemoglobin: 12.7 g/dL (ref 11.1–15.9)
IMMATURE GRANULOCYTES: 0 %
Immature Grans (Abs): 0 10*3/uL (ref 0.0–0.1)
LYMPHS ABS: 1.7 10*3/uL (ref 0.7–3.1)
Lymphs: 29 %
MCH: 28.9 pg (ref 26.6–33.0)
MCHC: 32.9 g/dL (ref 31.5–35.7)
MCV: 88 fL (ref 79–97)
MONOS ABS: 0.3 10*3/uL (ref 0.1–0.9)
Monocytes: 6 %
NEUTROS PCT: 62 %
Neutrophils Absolute: 3.7 10*3/uL (ref 1.4–7.0)
PLATELETS: 343 10*3/uL (ref 150–379)
RBC: 4.4 x10E6/uL (ref 3.77–5.28)
RDW: 14.1 % (ref 12.3–15.4)
WBC: 6 10*3/uL (ref 3.4–10.8)

## 2017-10-23 ENCOUNTER — Other Ambulatory Visit: Payer: Self-pay | Admitting: Cardiovascular Disease

## 2017-10-23 ENCOUNTER — Telehealth: Payer: Self-pay | Admitting: *Deleted

## 2017-10-23 NOTE — Telephone Encounter (Signed)
-----   Message from James Allred, MD sent at 10/23/2017 12:26 AM EST ----- Results reviewed.  Kelly, please inform pt of result. I will route to primary care also. 

## 2017-10-23 NOTE — Telephone Encounter (Signed)
PT AWARE OF RESULTS 

## 2017-10-28 ENCOUNTER — Encounter: Payer: Self-pay | Admitting: Internal Medicine

## 2017-11-04 ENCOUNTER — Encounter: Payer: Self-pay | Admitting: Family Medicine

## 2017-11-04 ENCOUNTER — Ambulatory Visit (INDEPENDENT_AMBULATORY_CARE_PROVIDER_SITE_OTHER): Payer: Medicare HMO | Admitting: Family Medicine

## 2017-11-04 ENCOUNTER — Telehealth: Payer: Self-pay | Admitting: Internal Medicine

## 2017-11-04 ENCOUNTER — Encounter: Payer: Self-pay | Admitting: Internal Medicine

## 2017-11-04 VITALS — BP 139/84 | HR 74 | Temp 100.2°F | Ht 66.0 in | Wt 209.0 lb

## 2017-11-04 DIAGNOSIS — J441 Chronic obstructive pulmonary disease with (acute) exacerbation: Secondary | ICD-10-CM | POA: Diagnosis not present

## 2017-11-04 DIAGNOSIS — R509 Fever, unspecified: Secondary | ICD-10-CM

## 2017-11-04 DIAGNOSIS — J189 Pneumonia, unspecified organism: Secondary | ICD-10-CM

## 2017-11-04 LAB — VERITOR FLU A/B WAIVED
INFLUENZA A: NEGATIVE
Influenza B: NEGATIVE

## 2017-11-04 MED ORDER — PREDNISONE 20 MG PO TABS: ORAL_TABLET | ORAL | 0 refills | Status: DC

## 2017-11-04 MED ORDER — DOXYCYCLINE HYCLATE 100 MG PO TABS: 100.0000 mg | ORAL_TABLET | Freq: Two times a day (BID) | ORAL | 0 refills | Status: DC

## 2017-11-04 NOTE — Telephone Encounter (Signed)
Returned call to patient and sent message via mychart.  Let her know will cancel both TEE and ablation for tomorrow.  She will call back when she is better to reschedule.  EP lab and central scheduling aware to cancel.

## 2017-11-04 NOTE — Progress Notes (Signed)
BP 139/84   Pulse 74   Temp 100.2 F (37.9 C) (Oral)   Ht 5\' 6"  (1.676 m)   Wt 209 lb (94.8 kg)   SpO2 94%   BMI 33.73 kg/m    Subjective:    Patient ID: [name], female    DOB: [birth year], 67 y.o.   MRN: [id number]  HPI: [name] is a 67 y.o. female presenting on 11/04/2017 for Chest congestion, runny nose, SOB with activity, cough, feve (scheduled to have cardiac ablation tomorrow)   HPI Cough chest congestion wheezing Patient has been having cough and chest congestion and wheezing that has been worsening over the past 24 hours.  She says she normally has a little bit of a wheeze and a little bit of cough chronically from her COPD.  She also developed chills and fever-like symptoms overnight and then has a fever of 100.2 here in the office today.  She works at the [employer] so she is in contact with different people all of the time but does not know of any specific illness that she has been exposed to.  She did use her COPD inhalers and they have been helping but it is still worsening.  She is supposed to have an ablation for her atrial flutter tomorrow which she has been instructed to delay.  Relevant past medical, surgical, family and social history reviewed and updated as indicated. Interim medical history since our last visit reviewed. Allergies and medications reviewed and updated.  Review of Systems  Constitutional: Positive for chills and fever.  HENT: Positive for congestion, postnasal drip, rhinorrhea and sinus pressure. Negative for ear discharge, ear pain, sneezing and sore throat.   Eyes: Negative for visual disturbance.  Respiratory: Positive for cough, chest tightness, shortness of breath and wheezing.   Cardiovascular: Positive for palpitations. Negative for chest pain and leg swelling.  Genitourinary: Negative for difficulty urinating and dysuria.  Musculoskeletal: Positive for myalgias. Negative for back pain and gait problem.  Skin: Negative for rash.    Neurological: Negative for light-headedness and headaches.  Psychiatric/Behavioral: Negative for agitation and behavioral problems.  All other systems reviewed and are negative.   Per HPI unless specifically indicated above        Objective:    BP 139/84   Pulse 74   Temp 100.2 F (37.9 C) (Oral)   Ht 5\' 6"  (1.676 m)   Wt 209 lb (94.8 kg)   SpO2 94%   BMI 33.73 kg/m   Wt Readings from Last 3 Encounters:  11/04/17 209 lb (94.8 kg)  10/16/17 206 lb 12.8 oz (93.8 kg)  10/02/17 211 lb (95.7 kg)    Physical Exam  Constitutional: She is oriented to person, place, and time. She appears well-developed and well-nourished. No distress.  HENT:  Right Ear: Tympanic membrane, external ear and ear canal normal.  Left Ear: Tympanic membrane, external ear and ear canal normal.  Nose: Mucosal edema and rhinorrhea present. No epistaxis. Right sinus exhibits no maxillary sinus tenderness and no frontal sinus tenderness. Left sinus exhibits no maxillary sinus tenderness and no frontal sinus tenderness.  Mouth/Throat: Uvula is midline and mucous membranes are normal. Posterior oropharyngeal edema and posterior oropharyngeal erythema present. No oropharyngeal exudate or tonsillar abscesses.  Eyes: Conjunctivae are normal.  Cardiovascular: Normal rate, normal heart sounds and intact distal pulses. An irregularly irregular rhythm present.  No murmur heard. Pulmonary/Chest: Effort normal. No accessory muscle usage. No tachypnea and no bradypnea. No respiratory distress. She has  wheezes. She has rhonchi. She has rales in the right lower field and the left lower field.  Musculoskeletal: Normal range of motion.  Neurological: She is alert and oriented to person, place, and time. Coordination normal.  Skin: Skin is warm and dry. No rash noted. She is not diaphoretic.  Psychiatric: She has a normal mood and affect. Her behavior is normal.  Vitals reviewed.       Assessment & Plan:   Problem List  Items Addressed This Visit    None    Visit Diagnoses    Atypical pneumonia    -  Primary   Relevant Medications   doxycycline (VIBRA-TABS) 100 MG tablet   predniSONE (DELTASONE) 20 MG tablet   Fever, unspecified fever cause       Relevant Medications   doxycycline (VIBRA-TABS) 100 MG tablet   predniSONE (DELTASONE) 20 MG tablet   Other Relevant Orders   Veritor Flu A/B Waived   COPD exacerbation (HCC)       Relevant Medications   doxycycline (VIBRA-TABS) 100 MG tablet   predniSONE (DELTASONE) 20 MG tablet       Follow up plan: Return if symptoms worsen or fail to improve.  Counseling provided for all of the vaccine components Orders Placed This Encounter  Procedures  . Veritor Flu A/B Kief, MD [facility] 11/04/2017, 10:49 AM

## 2017-11-04 NOTE — Telephone Encounter (Signed)
New message     Patient states she just came from her PCP and was diagnosed with Pneumonia, should she still have the ablation done or cancel?

## 2017-11-05 ENCOUNTER — Encounter (HOSPITAL_COMMUNITY): Admission: RE | Payer: Self-pay | Source: Ambulatory Visit

## 2017-11-05 ENCOUNTER — Ambulatory Visit (HOSPITAL_COMMUNITY): Admission: RE | Admit: 2017-11-05 | Payer: Medicare HMO | Source: Ambulatory Visit | Admitting: Cardiology

## 2017-11-05 SURGERY — ECHOCARDIOGRAM, TRANSESOPHAGEAL
Anesthesia: Moderate Sedation

## 2017-11-05 SURGERY — ATRIAL FIBRILLATION ABLATION
Anesthesia: Monitor Anesthesia Care

## 2017-11-13 ENCOUNTER — Ambulatory Visit: Payer: Medicare HMO | Admitting: Cardiovascular Disease

## 2017-11-20 ENCOUNTER — Other Ambulatory Visit: Payer: Self-pay | Admitting: Physician Assistant

## 2017-11-20 ENCOUNTER — Encounter: Payer: Self-pay | Admitting: Internal Medicine

## 2017-11-23 ENCOUNTER — Ambulatory Visit: Payer: Medicare HMO | Admitting: Cardiovascular Disease

## 2017-12-03 ENCOUNTER — Ambulatory Visit (HOSPITAL_COMMUNITY): Payer: Medicare HMO | Admitting: Nurse Practitioner

## 2017-12-06 ENCOUNTER — Encounter: Payer: Self-pay | Admitting: Physician Assistant

## 2017-12-07 ENCOUNTER — Other Ambulatory Visit: Payer: Self-pay | Admitting: Physician Assistant

## 2017-12-07 DIAGNOSIS — J189 Pneumonia, unspecified organism: Secondary | ICD-10-CM

## 2017-12-07 DIAGNOSIS — R509 Fever, unspecified: Secondary | ICD-10-CM

## 2017-12-07 DIAGNOSIS — J441 Chronic obstructive pulmonary disease with (acute) exacerbation: Secondary | ICD-10-CM

## 2017-12-07 MED ORDER — DOXYCYCLINE HYCLATE 100 MG PO TABS
100.0000 mg | ORAL_TABLET | Freq: Two times a day (BID) | ORAL | 0 refills | Status: DC
Start: 2017-12-07 — End: 2017-12-23

## 2017-12-07 MED ORDER — PREDNISONE 20 MG PO TABS: ORAL_TABLET | ORAL | 0 refills | Status: DC

## 2017-12-11 ENCOUNTER — Ambulatory Visit (INDEPENDENT_AMBULATORY_CARE_PROVIDER_SITE_OTHER): Payer: Medicare HMO

## 2017-12-11 ENCOUNTER — Ambulatory Visit (INDEPENDENT_AMBULATORY_CARE_PROVIDER_SITE_OTHER): Payer: Medicare HMO | Admitting: Family

## 2017-12-11 ENCOUNTER — Encounter: Payer: Self-pay | Admitting: Family

## 2017-12-11 VITALS — BP 153/97 | HR 48 | Temp 98.0°F | Ht 66.0 in | Wt 211.0 lb

## 2017-12-11 DIAGNOSIS — J449 Chronic obstructive pulmonary disease, unspecified: Secondary | ICD-10-CM

## 2017-12-11 DIAGNOSIS — J441 Chronic obstructive pulmonary disease with (acute) exacerbation: Secondary | ICD-10-CM

## 2017-12-11 DIAGNOSIS — J189 Pneumonia, unspecified organism: Secondary | ICD-10-CM

## 2017-12-11 DIAGNOSIS — J40 Bronchitis, not specified as acute or chronic: Secondary | ICD-10-CM | POA: Diagnosis not present

## 2017-12-11 DIAGNOSIS — R509 Fever, unspecified: Secondary | ICD-10-CM | POA: Diagnosis not present

## 2017-12-11 MED ORDER — PREDNISONE 20 MG PO TABS: ORAL_TABLET | ORAL | 0 refills | Status: DC

## 2017-12-11 NOTE — Patient Instructions (Signed)

## 2017-12-11 NOTE — Progress Notes (Signed)
   Subjective:    Patient ID: [name], female    DOB: [birth year], 67 y.o.   MRN: [id number]  Cough  This is a recurrent problem. The current episode started 1 to 4 weeks ago. The problem has been waxing and waning. The problem occurs every few minutes. The cough is productive of purulent sputum. Associated symptoms include headaches, nasal congestion, postnasal drip, rhinorrhea, shortness of breath and wheezing. The symptoms are aggravated by lying down. She has tried rest and OTC cough suppressant for the symptoms. The treatment provided mild relief. Her past medical history is significant for COPD.      Review of Systems  HENT: Positive for postnasal drip and rhinorrhea.   Respiratory: Positive for cough, shortness of breath and wheezing.   Neurological: Positive for headaches.  All other systems reviewed and are negative.      Objective:   Physical Exam  Constitutional: She is oriented to person, place, and time. She appears well-developed and well-nourished. No distress.  HENT:  Head: Normocephalic and atraumatic.  Right Ear: External ear normal.  Mouth/Throat: Oropharynx is clear and moist.  Eyes: Pupils are equal, round, and reactive to light.  Neck: Normal range of motion. Neck supple. No thyromegaly present.  Cardiovascular: Normal rate, regular rhythm, normal heart sounds and intact distal pulses.  No murmur heard. Pulmonary/Chest: Effort normal. No respiratory distress. She has no wheezes. She has rhonchi.  Abdominal: Soft. Bowel sounds are normal. She exhibits no distension. There is no tenderness.  Musculoskeletal: Normal range of motion. She exhibits no edema or tenderness.  Neurological: She is alert and oriented to person, place, and time.  Skin: Skin is warm and dry.  Psychiatric: She has a normal mood and affect. Her behavior is normal. Judgment and thought content normal.  Vitals reviewed.     BP (!) 153/97   Pulse (!) 48   Temp 98 F (36.7 C)  (Oral)   Ht 5\' 6"  (1.676 m)   Wt 211 lb (95.7 kg)   BMI 34.06 kg/m      Assessment & Plan:  1. COPD  GOLD III - Take meds as prescribed - Use a cool mist humidifier  -Use saline nose sprays frequently -Force fluids -For fever or aces or pains- take tylenol or ibuprofen appropriate for age and weight. -Throat lozenges if help -New toothbrush in 3 days - DG Chest 2 View; Future - predniSONE (DELTASONE) 20 MG tablet; 2 po at same time daily for 5 days  Dispense: 10 tablet; Refill: 0  2. Fever, unspecified fever cause - DG Chest 2 View; Future - predniSONE (DELTASONE) 20 MG tablet; 2 po at same time daily for 5 days  Dispense: 10 tablet; Refill: 0  3. Atypical pneumonia - predniSONE (DELTASONE) 20 MG tablet; 2 po at same time daily for 5 days  Dispense: 10 tablet; Refill: 0  4. COPD exacerbation (HCC) Continue inhalers  - DG Chest 2 View; Future - predniSONE (DELTASONE) 20 MG tablet; 2 po at same time daily for 5 days  Dispense: 10 tablet; Refill: 0    Evelina Dun, FNP

## 2017-12-15 ENCOUNTER — Other Ambulatory Visit: Payer: Self-pay | Admitting: Family

## 2017-12-15 DIAGNOSIS — R911 Solitary pulmonary nodule: Secondary | ICD-10-CM

## 2017-12-17 ENCOUNTER — Encounter: Payer: Self-pay | Admitting: Internal Medicine

## 2017-12-23 ENCOUNTER — Ambulatory Visit (INDEPENDENT_AMBULATORY_CARE_PROVIDER_SITE_OTHER): Payer: Medicare HMO | Admitting: Pediatrics

## 2017-12-23 ENCOUNTER — Encounter: Payer: Self-pay | Admitting: Pediatrics

## 2017-12-23 VITALS — BP 127/67 | HR 99 | Temp 98.2°F | Resp 22 | Ht 66.0 in | Wt 207.6 lb

## 2017-12-23 DIAGNOSIS — R911 Solitary pulmonary nodule: Secondary | ICD-10-CM

## 2017-12-23 DIAGNOSIS — J069 Acute upper respiratory infection, unspecified: Secondary | ICD-10-CM | POA: Diagnosis not present

## 2017-12-23 NOTE — Patient Instructions (Signed)
Fever reducer and headache: tylenol   Sinus pressure:  Nasal steroid such as flonase/fluticaone or nasocort daily Can also take daily antihistamine such as loratadine/claritin or cetirizine/zyrtec  Sinus rinses/irritation: Netipot or similar with distilled water 2-3 times a day to clear out sinuses or Normal saline nasal spray  Sore throat:  Throat lozenges chloroseptic spray  Stick with bland foods Drink lots of fluids  

## 2017-12-23 NOTE — Progress Notes (Signed)
  Subjective:   Patient ID: [name], female    DOB: May 03, 1951, 67 y.o.   MRN: [id number] CC: sinus congestion  HPI: [name] is a 67 y.o. female presenting for sinus congestion  Started on doxycycline a couple weeks ago. Still with nasal congestion. No facial pain. Blowing lots of mucus out of nose. Finished 8-10 days of doxycycline. Breathing is easier. No wheezing now. Was also started on prednisone at that time for COPD exacerbation. No fevers Appetite has been slightly down. Taking mucinex once a day. Drinking lots of fluids. No fever or chills recently.   Energy levels down some. Has been working.  Was told needs f/u CT for lung nodule.   Relevant past medical, surgical, family and social history reviewed. Allergies and medications reviewed and updated. Social History   Tobacco Use  Smoking Status Former Smoker  . Packs/day: 1.00  . Years: 20.00  . Pack years: 20.00  . Types: Cigarettes  . Last attempt to quit: 08/14/1990  . Years since quitting: 27.3  Smokeless Tobacco Never Used   ROS: Per HPI   Objective:    BP 127/67   Pulse 99   Temp 98.2 F (36.8 C) (Oral)   Resp (!) 22   Ht 5\' 6"  (1.676 m)   Wt 207 lb 9.6 oz (94.2 kg)   SpO2 93%   BMI 33.51 kg/m   Wt Readings from Last 3 Encounters:  12/23/17 207 lb 9.6 oz (94.2 kg)  12/11/17 211 lb (95.7 kg)  11/04/17 209 lb (94.8 kg)    Gen: NAD, alert, cooperative with exam, NCAT EYES: EOMI, no conjunctival injection, or no icterus ENT:  TMs pearly gray with white effusion b/l, OP with mild erythema, no ttp over sinuses LYMPH: no cervical LAD CV: NRRR, normal S1/S2 Resp: CTABL, no wheezes, normal WOB Abd: +BS, soft, NTND.  Ext: No edema, warm Neuro: Alert and oriented, strength equal b/l UE and LE, coordination grossly normal MSK: normal muscle bulk  Assessment & Plan:  [name] was seen today for sinus congestion.  Diagnoses and all orders for this visit:  Acute URI Recently finished course  of doxycycline. Still with some nasal discharge, no face pain, fevers, appetite is ok. Discussed symptom care with sinus rinses, return precautions. If not improving over next couple of weeks and for any worsening needs to be seen.  Solitary pulmonary nodule -     CT Chest Wo Contrast; Future   Follow up plan: prn Assunta Found, MD [place]

## 2017-12-29 ENCOUNTER — Other Ambulatory Visit: Payer: Self-pay | Admitting: Cardiovascular Disease

## 2018-01-11 ENCOUNTER — Ambulatory Visit (HOSPITAL_COMMUNITY)
Admission: RE | Admit: 2018-01-11 | Discharge: 2018-01-11 | Disposition: A | Discharge status: Home / Self Care | Payer: Medicare HMO | Source: Ambulatory Visit | Attending: Pediatrics | Admitting: Pediatrics

## 2018-01-11 DIAGNOSIS — I700 Atherosclerosis of aorta: Secondary | ICD-10-CM | POA: Diagnosis not present

## 2018-01-11 DIAGNOSIS — R918 Other nonspecific abnormal finding of lung field: Secondary | ICD-10-CM | POA: Diagnosis not present

## 2018-01-11 DIAGNOSIS — R911 Solitary pulmonary nodule: Secondary | ICD-10-CM

## 2018-01-20 ENCOUNTER — Ambulatory Visit (INDEPENDENT_AMBULATORY_CARE_PROVIDER_SITE_OTHER): Payer: Medicare HMO | Admitting: Physician Assistant

## 2018-01-20 ENCOUNTER — Encounter: Payer: Self-pay | Admitting: Physician Assistant

## 2018-01-20 VITALS — BP 132/71 | HR 62 | Temp 97.8°F | Ht 66.0 in | Wt 210.2 lb

## 2018-01-20 DIAGNOSIS — R918 Other nonspecific abnormal finding of lung field: Secondary | ICD-10-CM

## 2018-01-20 DIAGNOSIS — R911 Solitary pulmonary nodule: Secondary | ICD-10-CM

## 2018-01-20 NOTE — Progress Notes (Signed)
/   BP 132/71   Pulse 62   Temp 97.8 F (36.6 C) (Oral)   Ht 5\' 6"  (1.676 m)   Wt 210 lb 3.2 oz (95.3 kg)   BMI 33.93 kg/m    Subjective:    Patient ID: [name], female    DOB: [birth year], 67 y.o.   MRN: [id number]  HPI: [name] is a 67 y.o. female presenting on 01/20/2018 for discuss CT results  Patient comes in today to discuss the CT scan she had performed.  The one without contrast did show a pulmonary nodule.  This was a new finding.  She had previous ones that were all less than 1 cm.  This 1 is estimated to be 1.6 cm.  We have had a long discussion about ways to look at this.  We are going to try a nuclear medicine scan.  Like to have this as soon as possible.  She does have severe asthma and has had significant infections this year.  She also is trying to get a cardioversion scheduled but due to the illness that she is not been able to do it.  We will get her back in touch with Dr. Melvyn Novas for pulmonary evaluation.  Past Medical History:  Diagnosis Date  . COPD (chronic obstructive pulmonary disease) ([place])   . GERD (gastroesophageal reflux disease)   . Hypercholesteremia   . Mild mitral regurgitation   . Mitral valve prolapse    a. dx 1985, not seen on echoes more recently.  . Obesity   . Paroxysmal atrial fibrillation (HCC)   . Paroxysmal atrial flutter ([place])    a. dx 05/2017.   Relevant past medical, surgical, family and social history reviewed and updated as indicated. Interim medical history since our last visit reviewed. Allergies and medications reviewed and updated. DATA REVIEWED: CHART IN EPIC  Family History reviewed for pertinent findings.  Review of Systems  Constitutional: Positive for fatigue. Negative for fever and unexpected weight change.  HENT: Negative.   Eyes: Negative.   Respiratory: Positive for cough, shortness of breath and wheezing.   Cardiovascular: Positive for palpitations. Negative for chest pain and leg swelling.    Gastrointestinal: Negative.   Genitourinary: Negative.     Allergies as of 01/20/2018      Reactions   Penicillins Rash   Has patient had a PCN reaction causing immediate rash, facial/tongue/throat swelling, SOB or lightheadedness with hypotension: Yes Has patient had a PCN reaction causing severe rash involving mucus membranes or skin necrosis: Yes Has patient had a PCN reaction that required hospitalization: No Has patient had a PCN reaction occurring within the last 10 years: No If all of the above answers are "NO", then may proceed with Cephalosporin use.      Medication List        Accurate as of 01/20/18  4:19 PM. Always use your most recent med list.          albuterol 108 (90 Base) MCG/ACT inhaler Commonly known as:  PROAIR HFA Inhale 2 puffs into the lungs every 6 (six) hours as needed for wheezing or shortness of breath.   amiodarone 200 MG tablet Commonly known as:  PACERONE Take 1 tablet (200 mg total) by mouth daily. 200 mg twice a day for two weeks then 1 tablet by mouth daily   CRANBERRY PO Take 4,200 mg by mouth daily.   docusate sodium 100 MG capsule Commonly known as:  COLACE Take 100 mg by mouth  daily as needed for mild constipation.   ELIQUIS 5 MG Tabs tablet Generic drug:  apixaban TAKE  (1)  TABLET TWICE A DAY.   Fluticasone-Salmeterol 250-50 MCG/DOSE Aepb Commonly known as:  ADVAIR Inhale 1 puff into the lungs 2 (two) times daily.   guaiFENesin 600 MG 12 hr tablet Commonly known as:  MUCINEX Take 600 mg by mouth every morning.   meloxicam 7.5 MG tablet Commonly known as:  MOBIC Take 1 tablet (7.5 mg total) by mouth daily.   metoprolol tartrate 50 MG tablet Commonly known as:  LOPRESSOR Take 25 mg by mouth 2 (two) times daily.   montelukast 10 MG tablet Commonly known as:  SINGULAIR Take 1 tablet (10 mg total) by mouth at bedtime.   ranitidine 150 MG tablet Commonly known as:  ZANTAC Take 150 mg by mouth daily as needed for  heartburn.   tiotropium 18 MCG inhalation capsule Commonly known as:  SPIRIVA Place 1 capsule (18 mcg total) into inhaler and inhale daily.   VASCEPA 1 g Caps Generic drug:  Icosapent Ethyl TAKE (2) CAPSULES TWICE DAILY.   Vitamin D3 5000 units Caps Take 5,000 Units by mouth daily.          Objective:    BP 132/71   Pulse 62   Temp 97.8 F (36.6 C) (Oral)   Ht 5\' 6"  (1.676 m)   Wt 210 lb 3.2 oz (95.3 kg)   BMI 33.93 kg/m   Allergies  Allergen Reactions  . Penicillins Rash    Has patient had a PCN reaction causing immediate rash, facial/tongue/throat swelling, SOB or lightheadedness with hypotension: Yes Has patient had a PCN reaction causing severe rash involving mucus membranes or skin necrosis: Yes Has patient had a PCN reaction that required hospitalization: No Has patient had a PCN reaction occurring within the last 10 years: No If all of the above answers are "NO", then may proceed with Cephalosporin use.     Wt Readings from Last 3 Encounters:  01/20/18 210 lb 3.2 oz (95.3 kg)  12/23/17 207 lb 9.6 oz (94.2 kg)  12/11/17 211 lb (95.7 kg)    Physical Exam  Constitutional: She is oriented to person, place, and time. She appears well-developed and well-nourished.  HENT:  Head: Normocephalic and atraumatic.  Eyes: Conjunctivae and EOM are normal. Pupils are equal, round, and reactive to light.  Cardiovascular: Normal rate, regular rhythm, normal heart sounds and intact distal pulses.  Pulmonary/Chest: Effort normal and breath sounds normal.  Abdominal: Soft. Bowel sounds are normal.  Neurological: She is alert and oriented to person, place, and time. She has normal reflexes.  Skin: Skin is warm and dry. No rash noted.  Psychiatric: She has a normal mood and affect. Her behavior is normal. Judgment and thought content normal.        Assessment & Plan:   1. Lung nodules  2. Solitary pulmonary nodule - NM PET Image Initial (PI) Skull Base To Thigh;  Future   Continue all other maintenance medications as listed above.  Follow up plan: Return in about 3 weeks (around 02/10/2018) for recheck.  Educational handout given for [place] PA-C [facility] [address]  [place], [place] [address] [phone]   01/20/2018, 4:19 PM

## 2018-01-27 ENCOUNTER — Encounter: Payer: Self-pay | Admitting: Internal Medicine

## 2018-01-28 ENCOUNTER — Other Ambulatory Visit: Payer: Self-pay | Admitting: *Deleted

## 2018-01-28 ENCOUNTER — Other Ambulatory Visit: Payer: Self-pay | Admitting: Cardiovascular Disease

## 2018-01-28 MED ORDER — APIXABAN 5 MG PO TABS: ORAL_TABLET | ORAL | 0 refills | Status: DC

## 2018-02-01 ENCOUNTER — Other Ambulatory Visit: Payer: Self-pay | Admitting: Physician Assistant

## 2018-02-10 ENCOUNTER — Ambulatory Visit (INDEPENDENT_AMBULATORY_CARE_PROVIDER_SITE_OTHER): Payer: Medicare HMO | Admitting: Physician Assistant

## 2018-02-10 ENCOUNTER — Encounter: Payer: Self-pay | Admitting: Physician Assistant

## 2018-02-10 VITALS — BP 123/67 | HR 56 | Temp 98.1°F | Ht 66.0 in | Wt 214.2 lb

## 2018-02-10 DIAGNOSIS — J449 Chronic obstructive pulmonary disease, unspecified: Secondary | ICD-10-CM

## 2018-02-10 DIAGNOSIS — R918 Other nonspecific abnormal finding of lung field: Secondary | ICD-10-CM | POA: Diagnosis not present

## 2018-02-10 MED ORDER — FLUTICASONE-UMECLIDIN-VILANT 100-62.5-25 MCG/INH IN AEPB
1.0000 [IU] | INHALATION_SPRAY | Freq: Every day | RESPIRATORY_TRACT | 11 refills | Status: DC
Start: 2018-02-10 — End: 2018-08-23

## 2018-02-10 NOTE — Progress Notes (Signed)
BP 123/67   Pulse (!) 56   Temp 98.1 F (36.7 C) (Oral)   Ht 5\' 6"  (1.676 m)   Wt 214 lb 3.2 oz (97.2 kg)   SpO2 93%   BMI 34.57 kg/m    Subjective:    Patient ID: [name], female    DOB: Mar 29, 1951, 67 y.o.   MRN: [id number]  HPI: [name] is a 67 y.o. female presenting on 02/10/2018 for Follow-up (3 week rck on new medication )  Patient comes in for 3-week recheck on her breathing.  She has had issues with asthma in the past.  She does get frequent bronchitis is.  She is having a PET scan next week to evaluate a new nodule that showed up on the last x-ray and CT. the patient does report an improvement in her breathing with taking the Trelegy.  She only complains of it making her feel little nauseous when she first takes it in.  She had stopped the Spiriva and Advair at this time.  She has found that she has been able to use her albuterol less and less.  We are going to plan for an appointment with Dr. Melvyn Novas in the coming weeks.  Past Medical History:  Diagnosis Date  . COPD (chronic obstructive pulmonary disease) ([facility])   . GERD (gastroesophageal reflux disease)   . Hypercholesteremia   . Mild mitral regurgitation   . Mitral valve prolapse    a. dx 1985, not seen on echoes more recently.  . Obesity   . Paroxysmal atrial fibrillation (HCC)   . Paroxysmal atrial flutter ([place])    a. dx 05/2017.   Relevant past medical, surgical, family and social history reviewed and updated as indicated. Interim medical history since our last visit reviewed. Allergies and medications reviewed and updated. DATA REVIEWED: CHART IN EPIC  Family History reviewed for pertinent findings.  Review of Systems  Constitutional: Negative.  Negative for activity change, fatigue and fever.  HENT: Positive for congestion.   Eyes: Negative.   Respiratory: Positive for cough and wheezing. Negative for shortness of breath.   Cardiovascular: Negative.  Negative for chest pain.    Gastrointestinal: Negative.  Negative for abdominal pain.  Endocrine: Negative.   Genitourinary: Negative.  Negative for dysuria.  Musculoskeletal: Negative.   Skin: Negative.   Neurological: Negative.     Allergies as of 02/10/2018      Reactions   Penicillins Rash   Has patient had a PCN reaction causing immediate rash, facial/tongue/throat swelling, SOB or lightheadedness with hypotension: Yes Has patient had a PCN reaction causing severe rash involving mucus membranes or skin necrosis: Yes Has patient had a PCN reaction that required hospitalization: No Has patient had a PCN reaction occurring within the last 10 years: No If all of the above answers are "NO", then may proceed with Cephalosporin use.      Medication List        Accurate as of 02/10/18 11:15 AM. Always use your most recent med list.          albuterol 108 (90 Base) MCG/ACT inhaler Commonly known as:  PROAIR HFA Inhale 2 puffs into the lungs every 6 (six) hours as needed for wheezing or shortness of breath.   amiodarone 200 MG tablet Commonly known as:  PACERONE Take 1 tablet (200 mg total) by mouth daily.   apixaban 5 MG Tabs tablet Commonly known as:  ELIQUIS TAKE  (1)  TABLET TWICE A DAY.  CRANBERRY PO Take 4,200 mg by mouth daily.   docusate sodium 100 MG capsule Commonly known as:  COLACE Take 100 mg by mouth daily as needed for mild constipation.   Fluticasone-Umeclidin-Vilant 100-62.5-25 MCG/INH Aepb Commonly known as:  TRELEGY ELLIPTA Inhale 1 Units into the lungs daily.   guaiFENesin 600 MG 12 hr tablet Commonly known as:  MUCINEX Take 600 mg by mouth every morning.   meloxicam 7.5 MG tablet Commonly known as:  MOBIC Take 1 tablet (7.5 mg total) by mouth daily.   metoprolol tartrate 50 MG tablet Commonly known as:  LOPRESSOR Take 25 mg by mouth 2 (two) times daily.   montelukast 10 MG tablet Commonly known as:  SINGULAIR Take 1 tablet (10 mg total) by mouth at bedtime.    ranitidine 150 MG tablet Commonly known as:  ZANTAC Take 150 mg by mouth daily as needed for heartburn.   VASCEPA 1 g Caps Generic drug:  Icosapent Ethyl TAKE (2) CAPSULES TWICE DAILY.   Vitamin D3 5000 units Caps Take 5,000 Units by mouth daily.          Objective:    BP 123/67   Pulse (!) 56   Temp 98.1 F (36.7 C) (Oral)   Ht 5\' 6"  (1.676 m)   Wt 214 lb 3.2 oz (97.2 kg)   SpO2 93%   BMI 34.57 kg/m   Allergies  Allergen Reactions  . Penicillins Rash    Has patient had a PCN reaction causing immediate rash, facial/tongue/throat swelling, SOB or lightheadedness with hypotension: Yes Has patient had a PCN reaction causing severe rash involving mucus membranes or skin necrosis: Yes Has patient had a PCN reaction that required hospitalization: No Has patient had a PCN reaction occurring within the last 10 years: No If all of the above answers are "NO", then may proceed with Cephalosporin use.     Wt Readings from Last 3 Encounters:  02/10/18 214 lb 3.2 oz (97.2 kg)  01/20/18 210 lb 3.2 oz (95.3 kg)  12/23/17 207 lb 9.6 oz (94.2 kg)    Physical Exam  Constitutional: She is oriented to person, place, and time. She appears well-developed and well-nourished.  HENT:  Head: Normocephalic and atraumatic.  Right Ear: Tympanic membrane, external ear and ear canal normal.  Left Ear: Tympanic membrane, external ear and ear canal normal.  Nose: Nose normal. No rhinorrhea.  Mouth/Throat: Oropharynx is clear and moist and mucous membranes are normal. No oropharyngeal exudate or posterior oropharyngeal erythema.  Eyes: Pupils are equal, round, and reactive to light. Conjunctivae and EOM are normal.  Neck: Normal range of motion. Neck supple.  Cardiovascular: Normal rate, regular rhythm, normal heart sounds and intact distal pulses.  Pulmonary/Chest: Effort normal. She has wheezes in the right upper field and the left upper field.      Abdominal: Soft. Bowel sounds are  normal.  Neurological: She is alert and oriented to person, place, and time. She has normal reflexes.  Skin: Skin is warm and dry. No rash noted.  Psychiatric: She has a normal mood and affect. Her behavior is normal. Judgment and thought content normal.        Assessment & Plan:   1. COPD  GOLD III - Fluticasone-Umeclidin-Vilant (TRELEGY ELLIPTA) 100-62.5-25 MCG/INH AEPB; Inhale 1 Units into the lungs daily.  Dispense: 60 each; Refill: 11  2. Multiple pulmonary nodules    Continue all other maintenance medications as listed above.  Follow up plan: Return in about 3 months (around 05/12/2018)  for recheck.  Educational handout given for [place] PA-C [facility] [address]  [place], [place] [address] [phone]   02/10/2018, 11:15 AM

## 2018-02-10 NOTE — Patient Instructions (Signed)
In a few days you may receive a survey in the mail or online from [employer] regarding your visit with us today. Please take a moment to fill this out. Your feedback is very important to our whole office. It can help us better understand your needs as well as improve your experience and satisfaction. Thank you for taking your time to complete it. We care about you.  Tierney Behl, PA-C  

## 2018-02-15 ENCOUNTER — Ambulatory Visit (INDEPENDENT_AMBULATORY_CARE_PROVIDER_SITE_OTHER): Payer: Medicare HMO | Admitting: Physician Assistant

## 2018-02-15 ENCOUNTER — Encounter: Payer: Self-pay | Admitting: Physician Assistant

## 2018-02-15 ENCOUNTER — Ambulatory Visit (HOSPITAL_COMMUNITY)
Admission: RE | Admit: 2018-02-15 | Discharge: 2018-02-15 | Disposition: A | Discharge status: Home / Self Care | Payer: Medicare HMO | Source: Ambulatory Visit | Attending: Physician Assistant | Admitting: Physician Assistant

## 2018-02-15 VITALS — BP 132/71 | HR 48 | Temp 97.7°F | Ht 66.0 in | Wt 208.8 lb

## 2018-02-15 DIAGNOSIS — R918 Other nonspecific abnormal finding of lung field: Secondary | ICD-10-CM | POA: Diagnosis not present

## 2018-02-15 DIAGNOSIS — I2510 Atherosclerotic heart disease of native coronary artery without angina pectoris: Secondary | ICD-10-CM | POA: Insufficient documentation

## 2018-02-15 DIAGNOSIS — R911 Solitary pulmonary nodule: Secondary | ICD-10-CM | POA: Diagnosis not present

## 2018-02-15 DIAGNOSIS — I700 Atherosclerosis of aorta: Secondary | ICD-10-CM | POA: Diagnosis not present

## 2018-02-15 DIAGNOSIS — M62830 Muscle spasm of back: Secondary | ICD-10-CM | POA: Diagnosis not present

## 2018-02-15 MED ORDER — PREDNISONE 10 MG (21) PO TBPK: ORAL_TABLET | ORAL | 0 refills | Status: DC

## 2018-02-15 MED ORDER — FLUDEOXYGLUCOSE F - 18 (FDG) INJECTION
10.0000 | Freq: Once | INTRAVENOUS | Status: AC
  Administered 2018-02-15: 10.05 via INTRAVENOUS

## 2018-02-15 MED ORDER — TIZANIDINE HCL 2 MG PO TABS
2.0000 mg | ORAL_TABLET | Freq: Three times a day (TID) | ORAL | 0 refills | Status: DC | PRN
Start: 2018-02-15 — End: 2018-02-23

## 2018-02-15 NOTE — Progress Notes (Signed)
BP 132/71   Pulse (!) 48   Temp 97.7 F (36.5 C) (Oral)   Ht 5\' 6"  (1.676 m)   Wt 208 lb 12.8 oz (94.7 kg)   BMI 33.70 kg/m    Subjective:    Patient ID: [name], female    DOB: 03/22/1951, 67 y.o.   MRN: [id number]  HPI: [name] is a 67 y.o. female presenting on 02/15/2018 for Spasms  Patient has had 2 days of thoracic back spasms.  Is primarily to the left of her spine.  It goes around the scapula.  She has more pain with deep breathing and lifting up and down of her arms.  She is only tried some Tylenol for pain.    Past Medical History:  Diagnosis Date  . COPD (chronic obstructive pulmonary disease) ([place])   . GERD (gastroesophageal reflux disease)   . Hypercholesteremia   . Mild mitral regurgitation   . Mitral valve prolapse    a. dx 1985, not seen on echoes more recently.  . Obesity   . Paroxysmal atrial fibrillation ([facility])   . Paroxysmal atrial flutter ([place])    a. dx 05/2017.   Relevant past medical, surgical, family and social history reviewed and updated as indicated. Interim medical history since our last visit reviewed. Allergies and medications reviewed and updated. DATA REVIEWED: CHART IN EPIC  Family History reviewed for pertinent findings.  Review of Systems  Constitutional: Negative.   HENT: Negative.   Eyes: Negative.   Respiratory: Negative.   Gastrointestinal: Negative.   Genitourinary: Negative.   Musculoskeletal: Positive for arthralgias, back pain and myalgias.    Allergies as of 02/15/2018      Reactions   Penicillins Rash   Has patient had a PCN reaction causing immediate rash, facial/tongue/throat swelling, SOB or lightheadedness with hypotension: Yes Has patient had a PCN reaction causing severe rash involving mucus membranes or skin necrosis: Yes Has patient had a PCN reaction that required hospitalization: No Has patient had a PCN reaction occurring within the last 10 years: No If all of the above answers are "NO", then  may proceed with Cephalosporin use.      Medication List        Accurate as of 02/15/18  1:39 PM. Always use your most recent med list.          albuterol 108 (90 Base) MCG/ACT inhaler Commonly known as:  PROAIR HFA Inhale 2 puffs into the lungs every 6 (six) hours as needed for wheezing or shortness of breath.   amiodarone 200 MG tablet Commonly known as:  PACERONE Take 1 tablet (200 mg total) by mouth daily.   apixaban 5 MG Tabs tablet Commonly known as:  ELIQUIS TAKE  (1)  TABLET TWICE A DAY.   CRANBERRY PO Take 4,200 mg by mouth daily.   docusate sodium 100 MG capsule Commonly known as:  COLACE Take 100 mg by mouth daily as needed for mild constipation.   Fluticasone-Umeclidin-Vilant 100-62.5-25 MCG/INH Aepb Commonly known as:  TRELEGY ELLIPTA Inhale 1 Units into the lungs daily.   guaiFENesin 600 MG 12 hr tablet Commonly known as:  MUCINEX Take 600 mg by mouth every morning.   meloxicam 7.5 MG tablet Commonly known as:  MOBIC Take 1 tablet (7.5 mg total) by mouth daily.   metoprolol tartrate 50 MG tablet Commonly known as:  LOPRESSOR Take 25 mg by mouth 2 (two) times daily.   montelukast 10 MG tablet Commonly known as:  SINGULAIR  Take 1 tablet (10 mg total) by mouth at bedtime.   predniSONE 10 MG (21) Tbpk tablet Commonly known as:  STERAPRED UNI-PAK 21 TAB As directed x 6 days   ranitidine 150 MG tablet Commonly known as:  ZANTAC Take 150 mg by mouth daily as needed for heartburn.   tiZANidine 2 MG tablet Commonly known as:  ZANAFLEX Take 1 tablet (2 mg total) by mouth every 8 (eight) hours as needed for muscle spasms.   VASCEPA 1 g Caps Generic drug:  Icosapent Ethyl TAKE (2) CAPSULES TWICE DAILY.   Vitamin D3 5000 units Caps Take 5,000 Units by mouth daily.          Objective:    BP 132/71   Pulse (!) 48   Temp 97.7 F (36.5 C) (Oral)   Ht 5\' 6"  (1.676 m)   Wt 208 lb 12.8 oz (94.7 kg)   BMI 33.70 kg/m   Allergies  Allergen  Reactions  . Penicillins Rash    Has patient had a PCN reaction causing immediate rash, facial/tongue/throat swelling, SOB or lightheadedness with hypotension: Yes Has patient had a PCN reaction causing severe rash involving mucus membranes or skin necrosis: Yes Has patient had a PCN reaction that required hospitalization: No Has patient had a PCN reaction occurring within the last 10 years: No If all of the above answers are "NO", then may proceed with Cephalosporin use.     Wt Readings from Last 3 Encounters:  02/15/18 208 lb 12.8 oz (94.7 kg)  02/10/18 214 lb 3.2 oz (97.2 kg)  01/20/18 210 lb 3.2 oz (95.3 kg)    Physical Exam  Constitutional: She is oriented to person, place, and time. She appears well-developed and well-nourished.  HENT:  Head: Normocephalic and atraumatic.  Eyes: Pupils are equal, round, and reactive to light. Conjunctivae and EOM are normal.  Cardiovascular: Normal rate, regular rhythm, normal heart sounds and intact distal pulses.  Pulmonary/Chest: Effort normal and breath sounds normal.  Abdominal: Soft. Bowel sounds are normal.  Musculoskeletal:       Right shoulder: She exhibits decreased range of motion, tenderness and spasm.       Arms: Neurological: She is alert and oriented to person, place, and time. She has normal reflexes.  Skin: Skin is warm and dry. No rash noted.  Psychiatric: She has a normal mood and affect. Her behavior is normal. Judgment and thought content normal.        Assessment & Plan:   1. Spasm of thoracic back muscle - predniSONE (STERAPRED UNI-PAK 21 TAB) 10 MG (21) TBPK tablet; As directed x 6 days  Dispense: 21 tablet; Refill: 0 - tiZANidine (ZANAFLEX) 2 MG tablet; Take 1 tablet (2 mg total) by mouth every 8 (eight) hours as needed for muscle spasms.  Dispense: 30 tablet; Refill: 0   Continue all other maintenance medications as listed above.  Follow up plan: Return if symptoms worsen or fail to improve.  Educational  handout given for [place] PA-C [facility] [address]  [place], [place] [address] [phone]   02/15/2018, 1:39 PM

## 2018-02-15 NOTE — Patient Instructions (Signed)

## 2018-02-23 ENCOUNTER — Ambulatory Visit (INDEPENDENT_AMBULATORY_CARE_PROVIDER_SITE_OTHER): Payer: Medicare HMO | Admitting: Family Medicine

## 2018-02-23 ENCOUNTER — Encounter: Payer: Self-pay | Admitting: Family Medicine

## 2018-02-23 VITALS — BP 155/72 | HR 59 | Temp 97.0°F | Ht 66.0 in | Wt 212.0 lb

## 2018-02-23 DIAGNOSIS — B029 Zoster without complications: Secondary | ICD-10-CM | POA: Diagnosis not present

## 2018-02-23 MED ORDER — VALACYCLOVIR HCL 1 G PO TABS: 1000.0000 mg | ORAL_TABLET | Freq: Three times a day (TID) | ORAL | 0 refills | Status: AC

## 2018-02-23 MED ORDER — GABAPENTIN 300 MG PO CAPS: 300.0000 mg | ORAL_CAPSULE | Freq: Every day | ORAL | 0 refills | Status: DC

## 2018-02-23 NOTE — Patient Instructions (Signed)
I have sent you in a medication called Valtrex to take 3 times a day for the next week.  This will treat the virus that is causing the shingles rash.  I have also prescribed you gabapentin, which will help with the pain associated with a shingles rash.  Take the gabapentin at nighttime, as it may cause sleepiness.  Follow-up with [facility] in about 2 weeks for recheck.  She may need to increase the gabapentin if you are still having neuropathic pain.  Shingles Shingles, which is also known as herpes zoster, is an infection that causes a painful skin rash and fluid-filled blisters. Shingles is not related to genital herpes, which is a sexually transmitted infection. Shingles only develops in people who:  Have had chickenpox.  Have received the chickenpox vaccine. (This is rare.)  What are the causes? Shingles is caused by varicella-zoster virus (VZV). This is the same virus that causes chickenpox. After exposure to VZV, the virus stays in the body in an inactive (dormant) state. Shingles develops if the virus reactivates. This can happen many years after the initial exposure to VZV. It is not known what causes this virus to reactivate. What increases the risk? People who have had chickenpox or received the chickenpox vaccine are at risk for shingles. Infection is more common in people who:  Are older than age 26.  Have a weakened defense (immune) system, such as those with HIV, AIDS, or cancer.  Are taking medicines that weaken the immune system, such as transplant medicines.  Are under great stress.  What are the signs or symptoms? Early symptoms of this condition include itching, tingling, and pain in an area on your skin. Pain may be described as burning, stabbing, or throbbing. A few days or weeks after symptoms start, a painful red rash appears, usually on one side of the body in a bandlike or beltlike pattern. The rash eventually turns into fluid-filled blisters that break open, scab over,  and dry up in about 2-3 weeks. At any time during the infection, you may also develop:  A fever.  Chills.  A headache.  An upset stomach.  How is this diagnosed? This condition is diagnosed with a skin exam. Sometimes, skin or fluid samples are taken from the blisters before a diagnosis is made. These samples are examined under a microscope or sent to a lab for testing. How is this treated? There is no specific cure for this condition. Your health care provider will probably prescribe medicines to help you manage pain, recover more quickly, and avoid long-term problems. Medicines may include:  Antiviral drugs.  Anti-inflammatory drugs.  Pain medicines.  If the area involved is on your face, you may be referred to a specialist, such as an eye doctor (ophthalmologist) or an ear, nose, and throat (ENT) doctor to help you avoid eye problems, chronic pain, or disability. Follow these instructions at home: Medicines  Take medicines only as directed by your health care provider.  Apply an anti-itch or numbing cream to the affected area as directed by your health care provider. Blister and Rash Care  Take a cool bath or apply cool compresses to the area of the rash or blisters as directed by your health care provider. This may help with pain and itching.  Keep your rash covered with a loose bandage (dressing). Wear loose-fitting clothing to help ease the pain of material rubbing against the rash.  Keep your rash and blisters clean with mild soap and cool water or  as directed by your health care provider.  Check your rash every day for signs of infection. These include redness, swelling, and pain that lasts or increases.  Do not pick your blisters.  Do not scratch your rash. General instructions  Rest as directed by your health care provider.  Keep all follow-up visits as directed by your health care provider. This is important.  Until your blisters scab over, your infection  can cause chickenpox in people who have never had it or been vaccinated against it. To prevent this from happening, avoid contact with other people, especially: ? Babies. ? Pregnant women. ? Children who have eczema. ? Elderly people who have transplants. ? People who have chronic illnesses, such as leukemia or AIDS. Contact a health care provider if:  Your pain is not relieved with prescribed medicines.  Your pain does not get better after the rash heals.  Your rash looks infected. Signs of infection include redness, swelling, and pain that lasts or increases. Get help right away if:  The rash is on your face or nose.  You have facial pain, pain around your eye area, or loss of feeling on one side of your face.  You have ear pain or you have ringing in your ear.  You have loss of taste.  Your condition gets worse. This information is not intended to replace advice given to you by your health care provider. Make sure you discuss any questions you have with your health care provider. Document Released: 10/20/2005 Document Revised: 06/15/2016 Document Reviewed: 08/31/2014 Elsevier Interactive Patient Education  2018 [employer].

## 2018-02-23 NOTE — Progress Notes (Signed)
Subjective: CC: Shingles PCP: Terald Sleeper, PA-C [id number] [name] is a 67 y.o. female presenting to clinic today for:  1. Rash/ back pain Patient notes that she was seen by her primary care provider a couple of weeks ago for what was thought to be a spasm of thoracic muscles.  She does that she was placed on prednisone and a muscle relaxer.  Symptoms actually did seem to improve but then returned.  Yesterday she noticed a rash in her upper back.  At first, she thought this may be related to heating pad use but then realized that this looks to be a shingles rash.  She has never had a shingles vaccine.  She notes associated discomfort and burning at the area of the rash.  She has not appreciated any vesicle formation.   ROS: Per HPI  Allergies  Allergen Reactions  . Penicillins Rash    Has patient had a PCN reaction causing immediate rash, facial/tongue/throat swelling, SOB or lightheadedness with hypotension: Yes Has patient had a PCN reaction causing severe rash involving mucus membranes or skin necrosis: Yes Has patient had a PCN reaction that required hospitalization: No Has patient had a PCN reaction occurring within the last 10 years: No If all of the above answers are "NO", then may proceed with Cephalosporin use.    Past Medical History:  Diagnosis Date  . COPD (chronic obstructive pulmonary disease) ([place])   . GERD (gastroesophageal reflux disease)   . Hypercholesteremia   . Mild mitral regurgitation   . Mitral valve prolapse    a. dx 1985, not seen on echoes more recently.  . Obesity   . Paroxysmal atrial fibrillation ([facility])   . Paroxysmal atrial flutter ([place])    a. dx 05/2017.    Current Outpatient Medications:  .  albuterol (PROAIR HFA) 108 (90 Base) MCG/ACT inhaler, Inhale 2 puffs into the lungs every 6 (six) hours as needed for wheezing or shortness of breath., Disp: 1 Inhaler, Rfl: 11 .  amiodarone (PACERONE) 200 MG tablet, Take 1 tablet (200 mg total) by  mouth daily., Disp: 90 tablet, Rfl: 1 .  apixaban (ELIQUIS) 5 MG TABS tablet, TAKE  (1)  TABLET TWICE A DAY., Disp: 60 tablet, Rfl: 0 .  Cholecalciferol (VITAMIN D3) 5000 UNITS CAPS, Take 5,000 Units by mouth daily. , Disp: , Rfl:  .  CRANBERRY PO, Take 4,200 mg by mouth daily., Disp: , Rfl:  .  docusate sodium (COLACE) 100 MG capsule, Take 100 mg by mouth daily as needed for mild constipation., Disp: , Rfl:  .  Fluticasone-Umeclidin-Vilant (TRELEGY ELLIPTA) 100-62.5-25 MCG/INH AEPB, Inhale 1 Units into the lungs daily., Disp: 60 each, Rfl: 11 .  guaiFENesin (MUCINEX) 600 MG 12 hr tablet, Take 600 mg by mouth every morning. , Disp: , Rfl:  .  meloxicam (MOBIC) 7.5 MG tablet, Take 1 tablet (7.5 mg total) by mouth daily., Disp: 90 tablet, Rfl: 3 .  metoprolol tartrate (LOPRESSOR) 50 MG tablet, Take 25 mg by mouth 2 (two) times daily., Disp: , Rfl:  .  montelukast (SINGULAIR) 10 MG tablet, Take 1 tablet (10 mg total) by mouth at bedtime., Disp: 90 tablet, Rfl: 3 .  ranitidine (ZANTAC) 150 MG tablet, Take 150 mg by mouth daily as needed for heartburn., Disp: , Rfl:  .  tiZANidine (ZANAFLEX) 2 MG tablet, Take 1 tablet (2 mg total) by mouth every 8 (eight) hours as needed for muscle spasms., Disp: 30 tablet, Rfl: 0 .  VASCEPA 1  g CAPS, TAKE (2) CAPSULES TWICE DAILY., Disp: 120 capsule, Rfl: 1 Social History   Socioeconomic History  . Marital status: Married    Spouse name: Not on file  . Number of children: 1  . Years of education: Not on file  . Highest education level: Not on file  Occupational History  . Occupation: Actuary  Social Needs  . Financial resource strain: Not on file  . Food insecurity:    Worry: Not on file    Inability: Not on file  . Transportation needs:    Medical: Not on file    Non-medical: Not on file  Tobacco Use  . Smoking status: Former Smoker    Packs/day: 1.00    Years: 20.00    Pack years: 20.00    Types: Cigarettes    Last attempt to quit: 08/14/1990      Years since quitting: 27.5  . Smokeless tobacco: Never Used  Substance and Sexual Activity  . Alcohol use: No    Alcohol/week: 0.0 oz  . Drug use: No  . Sexual activity: Not Currently  Lifestyle  . Physical activity:    Days per week: Not on file    Minutes per session: Not on file  . Stress: Not on file  Relationships  . Social connections:    Talks on phone: Not on file    Gets together: Not on file    Attends religious service: Not on file    Active member of club or organization: Not on file    Attends meetings of clubs or organizations: Not on file    Relationship status: Not on file  . Intimate partner violence:    Fear of current or ex partner: Not on file    Emotionally abused: Not on file    Physically abused: Not on file    Forced sexual activity: Not on file  Other Topics Concern  . Not on file  Social History Narrative  . Not on file   Family History  Problem Relation Age of Onset  . Heart attack Mother 107  . Asthma Mother   . Emphysema Father        smoked  . Atrial fibrillation Brother   . Hypertension Son     Objective: Office vital signs reviewed. BP (!) 155/72   Pulse (!) 59   Temp (!) 97 F (36.1 C) (Oral)   Ht 5\' 6"  (1.676 m)   Wt 212 lb (96.2 kg)   BMI 34.22 kg/m   Physical Examination:  General: Awake, alert, well nourished, No acute distress Skin: T5 dermatomal distribution with a blanching, erythematous rash that appears to be clustered.  There are no visible vesicles.  There is no exudate.  Rash is tender to palpation.  Assessment/ Plan: 67 y.o. female   1. Herpes zoster without complication Clinically consistent with a shingles rash.  I have started Valtrex 1 g 3 times daily for the next 7 days.  I have also prescribed her gabapentin 300 mg nightly.  She will follow-up with her PCP in 2 weeks for recheck and gabapentin can be titrated at that time.  I did advise her to discontinue use of oral corticosteroids and to be mindful of  contact with those with impaired immune systems.  She was good understanding and follow-up as needed.    Meds ordered this encounter  Medications  . valACYclovir (VALTREX) 1000 MG tablet    Sig: Take 1 tablet (1,000 mg total) by mouth 3 (  three) times daily for 7 days.    Dispense:  21 tablet    Refill:  0  . gabapentin (NEURONTIN) 300 MG capsule    Sig: Take 1 capsule (300 mg total) by mouth at bedtime.    Dispense:  30 capsule    Refill:  [place], DO [place] [phone]

## 2018-02-25 ENCOUNTER — Other Ambulatory Visit: Payer: Self-pay | Admitting: Physician Assistant

## 2018-02-28 ENCOUNTER — Encounter: Payer: Self-pay | Admitting: Internal Medicine

## 2018-03-01 ENCOUNTER — Other Ambulatory Visit: Payer: Self-pay | Admitting: Physician Assistant

## 2018-03-05 ENCOUNTER — Encounter (INDEPENDENT_AMBULATORY_CARE_PROVIDER_SITE_OTHER): Payer: Self-pay

## 2018-03-10 ENCOUNTER — Encounter: Payer: Self-pay | Admitting: Physician Assistant

## 2018-03-10 ENCOUNTER — Ambulatory Visit (INDEPENDENT_AMBULATORY_CARE_PROVIDER_SITE_OTHER): Payer: Medicare HMO | Admitting: Physician Assistant

## 2018-03-10 VITALS — BP 128/73 | HR 60 | Temp 97.1°F | Ht 66.0 in | Wt 211.8 lb

## 2018-03-10 DIAGNOSIS — B029 Zoster without complications: Secondary | ICD-10-CM

## 2018-03-10 NOTE — Progress Notes (Signed)
BP 128/73   Pulse 60   Temp (!) 97.1 F (36.2 C) (Oral)   Ht 5\' 6"  (1.676 m)   Wt 211 lb 12.8 oz (96.1 kg)   BMI 34.19 kg/m    Subjective:    Patient ID: [name], female    DOB: [birth year], 67 y.o.   MRN: [id number]  HPI: [name] is a 67 y.o. female presenting on 03/10/2018 for Herpes Zoster (2 week rck ) This patient comes in for recheck on her shingles.  2 weeks ago she was treated with an antiviral and gabapentin.  She states that she only took the gabapentin a little bit.  She had the understanding that it was more of a pain medication.  However her pain is greatly improved.  Do not feel that we need to go back and put her on gabapentin for the time.  If it persists she can let me know and we can try them.  Otherwise she is feeling quite good.   Past Medical History:  Diagnosis Date  . COPD (chronic obstructive pulmonary disease) ([place])   . GERD (gastroesophageal reflux disease)   . Hypercholesteremia   . Mild mitral regurgitation   . Mitral valve prolapse    a. dx 1985, not seen on echoes more recently.  . Obesity   . Paroxysmal atrial fibrillation ([facility])   . Paroxysmal atrial flutter ([place])    a. dx 05/2017.   Relevant past medical, surgical, family and social history reviewed and updated as indicated. Interim medical history since our last visit reviewed. Allergies and medications reviewed and updated. DATA REVIEWED: CHART IN EPIC  Family History reviewed for pertinent findings.  Review of Systems  Constitutional: Negative.   HENT: Negative.   Eyes: Negative.   Respiratory: Negative.   Gastrointestinal: Negative.   Genitourinary: Negative.   Musculoskeletal: Positive for myalgias.  Skin: Positive for rash.    Allergies as of 03/10/2018      Reactions   Penicillins Rash   Has patient had a PCN reaction causing immediate rash, facial/tongue/throat swelling, SOB or lightheadedness with hypotension: Yes Has patient had a PCN reaction causing  severe rash involving mucus membranes or skin necrosis: Yes Has patient had a PCN reaction that required hospitalization: No Has patient had a PCN reaction occurring within the last 10 years: No If all of the above answers are "NO", then may proceed with Cephalosporin use.      Medication List        Accurate as of 03/10/18  8:56 AM. Always use your most recent med list.          albuterol 108 (90 Base) MCG/ACT inhaler Commonly known as:  PROAIR HFA Inhale 2 puffs into the lungs every 6 (six) hours as needed for wheezing or shortness of breath.   amiodarone 200 MG tablet Commonly known as:  PACERONE Take 1 tablet (200 mg total) by mouth daily.   CRANBERRY PO Take 4,200 mg by mouth daily.   docusate sodium 100 MG capsule Commonly known as:  COLACE Take 100 mg by mouth daily as needed for mild constipation.   ELIQUIS 5 MG Tabs tablet Generic drug:  apixaban TAKE (1) TABLET TWICE A DAY.   Fluticasone-Umeclidin-Vilant 100-62.5-25 MCG/INH Aepb Commonly known as:  TRELEGY ELLIPTA Inhale 1 Units into the lungs daily.   gabapentin 300 MG capsule Commonly known as:  NEURONTIN Take 1 capsule (300 mg total) by mouth at bedtime.   guaiFENesin 600 MG  12 hr tablet Commonly known as:  MUCINEX Take 600 mg by mouth every morning.   meloxicam 7.5 MG tablet Commonly known as:  MOBIC Take 1 tablet (7.5 mg total) by mouth daily.   metoprolol tartrate 50 MG tablet Commonly known as:  LOPRESSOR Take 25 mg by mouth 2 (two) times daily.   montelukast 10 MG tablet Commonly known as:  SINGULAIR Take 1 tablet (10 mg total) by mouth at bedtime.   ranitidine 150 MG tablet Commonly known as:  ZANTAC Take 150 mg by mouth daily as needed for heartburn.   VASCEPA 1 g Caps Generic drug:  Icosapent Ethyl TAKE (2) CAPSULES TWICE DAILY.   Vitamin D3 5000 units Caps Take 5,000 Units by mouth daily.          Objective:    BP 128/73   Pulse 60   Temp (!) 97.1 F (36.2 C) (Oral)    Ht 5\' 6"  (1.676 m)   Wt 211 lb 12.8 oz (96.1 kg)   BMI 34.19 kg/m   Allergies  Allergen Reactions  . Penicillins Rash    Has patient had a PCN reaction causing immediate rash, facial/tongue/throat swelling, SOB or lightheadedness with hypotension: Yes Has patient had a PCN reaction causing severe rash involving mucus membranes or skin necrosis: Yes Has patient had a PCN reaction that required hospitalization: No Has patient had a PCN reaction occurring within the last 10 years: No If all of the above answers are "NO", then may proceed with Cephalosporin use.     Wt Readings from Last 3 Encounters:  03/10/18 211 lb 12.8 oz (96.1 kg)  02/23/18 212 lb (96.2 kg)  02/15/18 208 lb 12.8 oz (94.7 kg)    Physical Exam  Constitutional: She is oriented to person, place, and time. She appears well-developed and well-nourished.  HENT:  Head: Normocephalic and atraumatic.  Eyes: Pupils are equal, round, and reactive to light. Conjunctivae and EOM are normal.  Cardiovascular: Normal rate, regular rhythm, normal heart sounds and intact distal pulses.  Pulmonary/Chest: Effort normal and breath sounds normal.  Abdominal: Soft. Bowel sounds are normal.  Neurological: She is alert and oriented to person, place, and time. She has normal reflexes.  Skin: Skin is warm and dry. Lesion noted. No rash noted.     Psychiatric: She has a normal mood and affect. Her behavior is normal. Judgment and thought content normal.        Assessment & Plan:   1. Herpes zoster without complication   Continue all other maintenance medications as listed above.  Follow up plan: No follow-ups on file.  Educational handout given for Cricket PA-C [facility] [address]  [place], [place] [address] [phone]   03/10/2018, 8:56 AM

## 2018-03-10 NOTE — Patient Instructions (Signed)
In a few days you may receive a survey in the mail or online from [employer] regarding your visit with us today. Please take a moment to fill this out. Your feedback is very important to our whole office. It can help us better understand your needs as well as improve your experience and satisfaction. Thank you for taking your time to complete it. We care about you.  Zebulon Gantt, PA-C  

## 2018-03-23 ENCOUNTER — Other Ambulatory Visit: Payer: Self-pay | Admitting: Family Medicine

## 2018-03-31 ENCOUNTER — Ambulatory Visit (HOSPITAL_COMMUNITY)
Admission: RE | Admit: 2018-03-31 | Discharge: 2018-03-31 | Disposition: A | Discharge status: Home / Self Care | Payer: Medicare HMO | Source: Ambulatory Visit | Attending: Nurse Practitioner | Admitting: Nurse Practitioner

## 2018-03-31 ENCOUNTER — Encounter (HOSPITAL_COMMUNITY): Payer: Self-pay | Admitting: Nurse Practitioner

## 2018-03-31 VITALS — BP 128/84 | HR 63 | Ht 66.0 in | Wt 213.0 lb

## 2018-03-31 DIAGNOSIS — Z7951 Long term (current) use of inhaled steroids: Secondary | ICD-10-CM | POA: Insufficient documentation

## 2018-03-31 DIAGNOSIS — Z880 Allergy status to penicillin: Secondary | ICD-10-CM | POA: Diagnosis not present

## 2018-03-31 DIAGNOSIS — Z6834 Body mass index (BMI) 34.0-34.9, adult: Secondary | ICD-10-CM | POA: Insufficient documentation

## 2018-03-31 DIAGNOSIS — I4892 Unspecified atrial flutter: Secondary | ICD-10-CM | POA: Diagnosis not present

## 2018-03-31 DIAGNOSIS — Z825 Family history of asthma and other chronic lower respiratory diseases: Secondary | ICD-10-CM | POA: Insufficient documentation

## 2018-03-31 DIAGNOSIS — Z7901 Long term (current) use of anticoagulants: Secondary | ICD-10-CM | POA: Insufficient documentation

## 2018-03-31 DIAGNOSIS — I481 Persistent atrial fibrillation: Secondary | ICD-10-CM | POA: Insufficient documentation

## 2018-03-31 DIAGNOSIS — Z79899 Other long term (current) drug therapy: Secondary | ICD-10-CM | POA: Diagnosis not present

## 2018-03-31 DIAGNOSIS — Z87891 Personal history of nicotine dependence: Secondary | ICD-10-CM | POA: Insufficient documentation

## 2018-03-31 DIAGNOSIS — I4891 Unspecified atrial fibrillation: Secondary | ICD-10-CM | POA: Diagnosis present

## 2018-03-31 DIAGNOSIS — E669 Obesity, unspecified: Secondary | ICD-10-CM | POA: Diagnosis not present

## 2018-03-31 DIAGNOSIS — Z8249 Family history of ischemic heart disease and other diseases of the circulatory system: Secondary | ICD-10-CM | POA: Diagnosis not present

## 2018-03-31 DIAGNOSIS — J449 Chronic obstructive pulmonary disease, unspecified: Secondary | ICD-10-CM | POA: Insufficient documentation

## 2018-03-31 DIAGNOSIS — E7800 Pure hypercholesterolemia, unspecified: Secondary | ICD-10-CM | POA: Insufficient documentation

## 2018-03-31 DIAGNOSIS — K219 Gastro-esophageal reflux disease without esophagitis: Secondary | ICD-10-CM | POA: Diagnosis not present

## 2018-03-31 DIAGNOSIS — I4819 Other persistent atrial fibrillation: Secondary | ICD-10-CM

## 2018-03-31 NOTE — Progress Notes (Signed)
Primary Care Physician: Terald Sleeper, PA-C Referring Physician:Dr. Rayann Heman   [name] is a 67 y.o. female with a h/o afib/flutter that was seen by Dr. Rayann Heman in December after failing amiodarone. She was scheduled for ablation the end of January but developed pneumonia. After that cleared, she developed bronchitis. After that cleared, she developed the shingles. She is now in the afib clinic for f/u. She remains in atrial flutter at 63 bpm. Remains on amiodarone. She does want to go ahead and schedule ablation.    Today, she denies symptoms of palpitations, chest pain, shortness of breath, orthopnea, PND, lower extremity edema, dizziness, presyncope, syncope, or neurologic sequela. The patient is tolerating medications without difficulties and is otherwise without complaint today.   Past Medical History:  Diagnosis Date  . COPD (chronic obstructive pulmonary disease) ([place])   . GERD (gastroesophageal reflux disease)   . Hypercholesteremia   . Mild mitral regurgitation   . Mitral valve prolapse    a. dx 1985, not seen on echoes more recently.  . Obesity   . Paroxysmal atrial fibrillation ([facility])   . Paroxysmal atrial flutter ([place])    a. dx 05/2017.   Past Surgical History:  Procedure Laterality Date  . ABDOMINAL HYSTERECTOMY    . CARDIOVERSION N/A 10/08/2017   Procedure: CARDIOVERSION;  Surgeon: Herminio Commons, MD;  Location: [facility];  Service: Cardiovascular;  Laterality: N/A;  . CESAREAN SECTION    . TUBAL LIGATION      Current Outpatient Medications  Medication Sig Dispense Refill  . albuterol (PROAIR HFA) 108 (90 Base) MCG/ACT inhaler Inhale 2 puffs into the lungs every 6 (six) hours as needed for wheezing or shortness of breath. 1 Inhaler 11  . amiodarone (PACERONE) 200 MG tablet Take 1 tablet (200 mg total) by mouth daily. 90 tablet 1  . Cholecalciferol (VITAMIN D3) 5000 UNITS CAPS Take 5,000 Units by mouth daily.     Marland Kitchen CRANBERRY PO Take 4,200 mg by mouth  daily.    Marland Kitchen docusate sodium (COLACE) 100 MG capsule Take 100 mg by mouth daily as needed for mild constipation.    Marland Kitchen ELIQUIS 5 MG TABS tablet TAKE (1) TABLET TWICE A DAY. 60 tablet 2  . Fluticasone-Umeclidin-Vilant (TRELEGY ELLIPTA) 100-62.5-25 MCG/INH AEPB Inhale 1 Units into the lungs daily. 60 each 11  . guaiFENesin (MUCINEX) 600 MG 12 hr tablet Take 600 mg by mouth every morning.     . meloxicam (MOBIC) 7.5 MG tablet Take 1 tablet (7.5 mg total) by mouth daily. 90 tablet 3  . metoprolol tartrate (LOPRESSOR) 50 MG tablet Take 25 mg by mouth 2 (two) times daily.    . montelukast (SINGULAIR) 10 MG tablet Take 1 tablet (10 mg total) by mouth at bedtime. 90 tablet 0  . ranitidine (ZANTAC) 150 MG tablet Take 150 mg by mouth daily as needed for heartburn.    Marland Kitchen VASCEPA 1 g CAPS TAKE (2) CAPSULES TWICE DAILY. 120 capsule 1   No current facility-administered medications for this encounter.     Allergies  Allergen Reactions  . Penicillins Rash    Has patient had a PCN reaction causing immediate rash, facial/tongue/throat swelling, SOB or lightheadedness with hypotension: Yes Has patient had a PCN reaction causing severe rash involving mucus membranes or skin necrosis: Yes Has patient had a PCN reaction that required hospitalization: No Has patient had a PCN reaction occurring within the last 10 years: No If all of the above answers are "NO", then  may proceed with Cephalosporin use.     Social History   Socioeconomic History  . Marital status: Married    Spouse name: Not on file  . Number of children: 1  . Years of education: Not on file  . Highest education level: Not on file  Occupational History  . Occupation: Actuary  Social Needs  . Financial resource strain: Not on file  . Food insecurity:    Worry: Not on file    Inability: Not on file  . Transportation needs:    Medical: Not on file    Non-medical: Not on file  Tobacco Use  . Smoking status: Former Smoker     Packs/day: 1.00    Years: 20.00    Pack years: 20.00    Types: Cigarettes    Last attempt to quit: 08/14/1990    Years since quitting: 27.6  . Smokeless tobacco: Never Used  Substance and Sexual Activity  . Alcohol use: No    Alcohol/week: 0.0 oz  . Drug use: No  . Sexual activity: Not Currently  Lifestyle  . Physical activity:    Days per week: Not on file    Minutes per session: Not on file  . Stress: Not on file  Relationships  . Social connections:    Talks on phone: Not on file    Gets together: Not on file    Attends religious service: Not on file    Active member of club or organization: Not on file    Attends meetings of clubs or organizations: Not on file    Relationship status: Not on file  . Intimate partner violence:    Fear of current or ex partner: Not on file    Emotionally abused: Not on file    Physically abused: Not on file    Forced sexual activity: Not on file  Other Topics Concern  . Not on file  Social History Narrative  . Not on file    Family History  Problem Relation Age of Onset  . Heart attack Mother 67  . Asthma Mother   . Emphysema Father        smoked  . Atrial fibrillation Brother   . Hypertension Son     ROS- All systems are reviewed and negative except as per the HPI above  Physical Exam: Vitals:   03/31/18 1106  BP: 128/84  Pulse: 63  Weight: 213 lb (96.6 kg)  Height: 5\' 6"  (1.676 m)   Wt Readings from Last 3 Encounters:  03/31/18 213 lb (96.6 kg)  03/10/18 211 lb 12.8 oz (96.1 kg)  02/23/18 212 lb (96.2 kg)    Labs: Lab Results  Component Value Date   NA 142 10/22/2017   K 4.8 10/22/2017   CL 102 10/22/2017   CO2 29 10/22/2017   GLUCOSE 98 10/22/2017   BUN 18 10/22/2017   CREATININE 0.85 10/22/2017   CALCIUM 9.1 10/22/2017   Lab Results  Component Value Date   INR 1.09 05/10/2017   Lab Results  Component Value Date   CHOL 196 04/01/2017   HDL 45 04/01/2017   LDLCALC 119 (H) 04/01/2017   TRIG 161 (H)  04/01/2017     GEN- The patient is well appearing, alert and oriented x 3 today.   Head- normocephalic, atraumatic Eyes-  Sclera clear, conjunctiva pink Ears- hearing intact Oropharynx- clear Neck- supple, no JVP Lymph- no cervical lymphadenopathy Lungs- Clear to ausculation bilaterally, normal work of breathing Heart- irregular rate and  rhythm, no murmurs, rubs or gallops, PMI not laterally displaced GI- soft, NT, ND, + BS Extremities- no clubbing, cyanosis, or edema MS- no significant deformity or atrophy Skin- no rash or lesion Psych- euthymic mood, full affect Neuro- strength and sensation are intact  EKG- Atrial flutter at 63 bpm, qrs int 98 ms, qtc 462 ms Epic records reviewed Echo-Study Conclusions  - Left ventricle: The cavity size was normal. Wall thickness was   normal. Systolic function was normal. The estimated ejection   fraction was in the range of 60% to 65%. Wall motion was normal;   there were no regional wall motion abnormalities. Left   ventricular diastolic function parameters were normal. - Aortic valve: Trileaflet; mildly thickened leaflets. - Mitral valve: There was mild regurgitation.   Assessment and Plan: 1. Persistent afib/flutter Ablation  scheduled in December of 2018 but cancelled due to URI  Pt is rate controlled She continues on amiodarone at 200 mg qd Will leave on board as she wants to have ablation rescheduled and this may be helpful in the healing period Continue eliquis 5 mg bid without missed doses Will contact Dr. Jackalyn Lombard nurse to see if she can reschedule  pt now  Exira. Kenedie Dirocco, [facility] [address] [place], [place] [address] [phone]

## 2018-04-02 ENCOUNTER — Telehealth: Payer: Self-pay

## 2018-04-02 NOTE — Telephone Encounter (Signed)
Left generic message requesting call back.  

## 2018-04-02 NOTE — Telephone Encounter (Signed)
-----   Message from Thompson Grayer, MD sent at 04/01/2018  9:40 PM EDT ----- I reviewed her chart tonight. Go ahead and schedule for ablation.  Looks like she would need TEE rather than CT.   ----- Message ----- From: Damian Leavell, RN Sent: 04/01/2018   9:20 AM To: Thompson Grayer, MD  Can I just go ahead and schedule her?  Or do you want to see her?  You saw her 6 months ago, she saw Butch Penny yesterday. Sonia Baller ----- Message ----- From: Iona Hansen, CMA Sent: 04/01/2018   8:52 AM To: Damian Leavell, RN  Hey, this patient is ready to move forward with ablation.  She was preciously scheduled but became ill and procedure was canceled.  Please contact pt if she needs follow up with [facility] before being rescheduled.  Thank you  Estill Bamberg

## 2018-04-09 ENCOUNTER — Encounter: Payer: Self-pay | Admitting: Family Medicine

## 2018-04-09 ENCOUNTER — Ambulatory Visit (INDEPENDENT_AMBULATORY_CARE_PROVIDER_SITE_OTHER): Payer: Medicare HMO | Admitting: Family Medicine

## 2018-04-09 VITALS — BP 131/86 | HR 61 | Temp 98.4°F | Wt 214.0 lb

## 2018-04-09 DIAGNOSIS — M62830 Muscle spasm of back: Secondary | ICD-10-CM | POA: Diagnosis not present

## 2018-04-09 MED ORDER — TIZANIDINE HCL 2 MG PO TABS: 2.0000 mg | ORAL_TABLET | Freq: Three times a day (TID) | ORAL | 0 refills | Status: DC | PRN

## 2018-04-09 NOTE — Patient Instructions (Signed)
We discussed that okay to continue your prednisone for the next couple of days.  I would not use this longer than 5 days.  I have refilled the tizanidine to use for muscle spasm.  Be cautious with this medication as it can cause sleepiness.  Do not operate heavy machinery while using.  If your symptoms persist or you develop any red flag symptoms, please seek immediate medical attention.   Low Back Strain Rehab Ask your health care provider which exercises are safe for you. Do exercises exactly as told by your health care provider and adjust them as directed. It is normal to feel mild stretching, pulling, tightness, or discomfort as you do these exercises, but you should stop right away if you feel sudden pain or your pain gets worse. Do not begin these exercises until told by your health care provider. Stretching and range of motion exercises These exercises warm up your muscles and joints and improve the movement and flexibility of your back. These exercises also help to relieve pain, numbness, and tingling. Exercise A: Single knee to chest  1. Lie on your back on a firm surface with both legs straight. 2. Bend one of your knees. Use your hands to move your knee up toward your chest until you feel a gentle stretch in your lower back and buttock. ? Hold your leg in this position by holding onto the front of your knee. ? Keep your other leg as straight as possible. 3. Hold for __________ seconds. 4. Slowly return to the starting position. 5. Repeat with your other leg. Repeat __________ times. Complete this exercise __________ times a day. Exercise B: Prone extension on elbows  1. Lie on your abdomen on a firm surface. 2. Prop yourself up on your elbows. 3. Use your arms to help lift your chest up until you feel a gentle stretch in your abdomen and your lower back. ? This will place some of your body weight on your elbows. If this is uncomfortable, try stacking pillows under your chest. ? Your  hips should stay down, against the surface that you are lying on. Keep your hip and back muscles relaxed. 4. Hold for __________ seconds. 5. Slowly relax your upper body and return to the starting position. Repeat __________ times. Complete this exercise __________ times a day. Strengthening exercises These exercises build strength and endurance in your back. Endurance is the ability to use your muscles for a long time, even after they get tired. Exercise C: Pelvic tilt 1. Lie on your back on a firm surface. Bend your knees and keep your feet flat. 2. Tense your abdominal muscles. Tip your pelvis up toward the ceiling and flatten your lower back into the floor. ? To help with this exercise, you may place a small towel under your lower back and try to push your back into the towel. 3. Hold for __________ seconds. 4. Let your muscles relax completely before you repeat this exercise. Repeat __________ times. Complete this exercise __________ times a day. Exercise D: Alternating arm and leg raises  1. Get on your hands and knees on a firm surface. If you are on a hard floor, you may want to use padding to cushion your knees, such as an exercise mat. 2. Line up your arms and legs. Your hands should be below your shoulders, and your knees should be below your hips. 3. Lift your left leg behind you. At the same time, raise your right arm and straighten it in  front of you. ? Do not lift your leg higher than your hip. ? Do not lift your arm higher than your shoulder. ? Keep your abdominal and back muscles tight. ? Keep your hips facing the ground. ? Do not arch your back. ? Keep your balance carefully, and do not hold your breath. 4. Hold for __________ seconds. 5. Slowly return to the starting position and repeat with your right leg and your left arm. Repeat __________ times. Complete this exercise __________times a day. Exercise J: Single leg lower with bent knees 1. Lie on your back on a firm  surface. 2. Tense your abdominal muscles and lift your feet off the floor, one foot at a time, so your knees and hips are bent in an "L" shape (at about 90 degrees). ? Your knees should be over your hips and your lower legs should be parallel to the floor. 3. Keeping your abdominal muscles tense and your knee bent, slowly lower one of your legs so your toe touches the ground. 4. Lift your leg back up to return to the starting position. ? Do not hold your breath. ? Do not let your back arch. Keep your back flat against the ground. 5. Repeat with your other leg. Repeat __________ times. Complete this exercise __________ times a day. Posture and body mechanics  Body mechanics refers to the movements and positions of your body while you do your daily activities. Posture is part of body mechanics. Good posture and healthy body mechanics can help to relieve stress in your body's tissues and joints. Good posture means that your spine is in its natural S-curve position (your spine is neutral), your shoulders are pulled back slightly, and your head is not tipped forward. The following are general guidelines for applying improved posture and body mechanics to your everyday activities. Standing   When standing, keep your spine neutral and your feet about hip-width apart. Keep a slight bend in your knees. Your ears, shoulders, and hips should line up.  When you do a task in which you stand in one place for a long time, place one foot up on a stable object that is 2-4 inches (5-10 cm) high, such as a footstool. This helps keep your spine neutral. Sitting   When sitting, keep your spine neutral and keep your feet flat on the floor. Use a footrest, if necessary, and keep your thighs parallel to the floor. Avoid rounding your shoulders, and avoid tilting your head forward.  When working at a desk or a computer, keep your desk at a height where your hands are slightly lower than your elbows. Slide your chair  under your desk so you are close enough to maintain good posture.  When working at a computer, place your monitor at a height where you are looking straight ahead and you do not have to tilt your head forward or downward to look at the screen. Resting   When lying down and resting, avoid positions that are most painful for you.  If you have pain with activities such as sitting, bending, stooping, or squatting (flexion-based activities), lie in a position in which your body does not bend very much. For example, avoid curling up on your side with your arms and knees near your chest (fetal position).  If you have pain with activities such as standing for a long time or reaching with your arms (extension-based activities), lie with your spine in a neutral position and bend your knees slightly. Try the  following positions: ? Lying on your side with a pillow between your knees. ? Lying on your back with a pillow under your knees. Lifting   When lifting objects, keep your feet at least shoulder-width apart and tighten your abdominal muscles.  Bend your knees and hips and keep your spine neutral. It is important to lift using the strength of your legs, not your back. Do not lock your knees straight out.  Always ask for help to lift heavy or awkward objects. This information is not intended to replace advice given to you by your health care provider. Make sure you discuss any questions you have with your health care provider. Document Released: 10/20/2005 Document Revised: 06/26/2016 Document Reviewed: 08/01/2015 Elsevier Interactive Patient Education  [employer].

## 2018-04-09 NOTE — Progress Notes (Signed)
Subjective: CC: back pain PCP: Terald Sleeper, PA-C HPI: Patient is a 67 y.o. female presenting to clinic today for back pain. Concerns today include:  1. Back Pain Patient reports that pain began yesterday after she reached down to grab a box that had fallen.  She reports a h/o back pain in this area previously, that was treated with tizanidine.  Pain is stable from yesterday.  It has not worsened..  It does not radiate.  Standing and walking worsens pain.  Lying or sitting improves pain.  Patient has been taking tizanidine and 10 mg of prednisone for pain with some relief.  Patient denies trauma or injury.  Denies dysuria, hematuria, fevers, chills, nausea, vomiting, abdominal pain, renal stones.   Denies saddle anesthesia, urinary retention/incontinence, bowel incontinence, weakness, falls, sensation changes or pain anywhere else.    Current Outpatient Medications:  .  albuterol (PROAIR HFA) 108 (90 Base) MCG/ACT inhaler, Inhale 2 puffs into the lungs every 6 (six) hours as needed for wheezing or shortness of breath., Disp: 1 Inhaler, Rfl: 11 .  amiodarone (PACERONE) 200 MG tablet, Take 1 tablet (200 mg total) by mouth daily., Disp: 90 tablet, Rfl: 1 .  Cholecalciferol (VITAMIN D3) 5000 UNITS CAPS, Take 5,000 Units by mouth daily. , Disp: , Rfl:  .  CRANBERRY PO, Take 4,200 mg by mouth daily., Disp: , Rfl:  .  docusate sodium (COLACE) 100 MG capsule, Take 100 mg by mouth daily as needed for mild constipation., Disp: , Rfl:  .  ELIQUIS 5 MG TABS tablet, TAKE (1) TABLET TWICE A DAY., Disp: 60 tablet, Rfl: 2 .  Fluticasone-Umeclidin-Vilant (TRELEGY ELLIPTA) 100-62.5-25 MCG/INH AEPB, Inhale 1 Units into the lungs daily., Disp: 60 each, Rfl: 11 .  guaiFENesin (MUCINEX) 600 MG 12 hr tablet, Take 600 mg by mouth every morning. , Disp: , Rfl:  .  meloxicam (MOBIC) 7.5 MG tablet, Take 1 tablet (7.5 mg total) by mouth daily., Disp: 90 tablet, Rfl: 3 .  metoprolol tartrate (LOPRESSOR) 50 MG  tablet, Take 25 mg by mouth 2 (two) times daily., Disp: , Rfl:  .  montelukast (SINGULAIR) 10 MG tablet, Take 1 tablet (10 mg total) by mouth at bedtime., Disp: 90 tablet, Rfl: 0 .  ranitidine (ZANTAC) 150 MG tablet, Take 150 mg by mouth daily as needed for heartburn., Disp: , Rfl:  .  VASCEPA 1 g CAPS, TAKE (2) CAPSULES TWICE DAILY., Disp: 120 capsule, Rfl: 1 Allergies  Allergen Reactions  . Penicillins Rash    Has patient had a PCN reaction causing immediate rash, facial/tongue/throat swelling, SOB or lightheadedness with hypotension: Yes Has patient had a PCN reaction causing severe rash involving mucus membranes or skin necrosis: Yes Has patient had a PCN reaction that required hospitalization: No Has patient had a PCN reaction occurring within the last 10 years: No If all of the above answers are "NO", then may proceed with Cephalosporin use.     Past Medical History:  Diagnosis Date  . COPD (chronic obstructive pulmonary disease) ([place])   . GERD (gastroesophageal reflux disease)   . Hypercholesteremia   . Mild mitral regurgitation   . Mitral valve prolapse    a. dx 1985, not seen on echoes more recently.  . Obesity   . Paroxysmal atrial fibrillation ([facility])   . Paroxysmal atrial flutter ([place])    a. dx 05/2017.   Social History   Socioeconomic History  . Marital status: Married    Spouse name: Not on file  .  Number of children: 1  . Years of education: Not on file  . Highest education level: Not on file  Occupational History  . Occupation: Actuary  Social Needs  . Financial resource strain: Not on file  . Food insecurity:    Worry: Not on file    Inability: Not on file  . Transportation needs:    Medical: Not on file    Non-medical: Not on file  Tobacco Use  . Smoking status: Former Smoker    Packs/day: 1.00    Years: 20.00    Pack years: 20.00    Types: Cigarettes    Last attempt to quit: 08/14/1990    Years since quitting: 27.6  . Smokeless tobacco: Never  Used  Substance and Sexual Activity  . Alcohol use: No    Alcohol/week: 0.0 oz  . Drug use: No  . Sexual activity: Not Currently  Lifestyle  . Physical activity:    Days per week: Not on file    Minutes per session: Not on file  . Stress: Not on file  Relationships  . Social connections:    Talks on phone: Not on file    Gets together: Not on file    Attends religious service: Not on file    Active member of club or organization: Not on file    Attends meetings of clubs or organizations: Not on file    Relationship status: Not on file  . Intimate partner violence:    Fear of current or ex partner: Not on file    Emotionally abused: Not on file    Physically abused: Not on file    Forced sexual activity: Not on file  Other Topics Concern  . Not on file  Social History Narrative  . Not on file   Past Surgical History:  Procedure Laterality Date  . ABDOMINAL HYSTERECTOMY    . CARDIOVERSION N/A 10/08/2017   Procedure: CARDIOVERSION;  Surgeon: Herminio Commons, MD;  Location: [facility];  Service: Cardiovascular;  Laterality: N/A;  . CESAREAN SECTION    . TUBAL LIGATION      ROS: per HPI  Objective: Office vital signs reviewed. BP 131/86   Pulse 61   Temp 98.4 F (36.9 C)   Wt 214 lb (97.1 kg)   BMI 34.54 kg/m   Physical Examination:  General: Awake, alert, obese, NAD Pulm: Normal work of breathing on room air. Extremities: Warm, well-perfused. No edema, cyanosis or clubbing; +2 pulses bilaterally MSK: Antalgic gait and hunched station  Lumbar Spine: Patient has full, painless AROM in flexion and rotation.  She does have some difficulty with extension of the spine secondary to pain, no midline tenderness to palpation, right-sided lumbosacral paraspinal tenderness to palpation present.  No palpable bony deformities, negative straight leg test Neuro: 4/5 lower extremity strength bilaterally; lower extremity light touch sensation grossly intact  Assessment/  Plan: [name] is a 67 y.o. female here with  1. Lumbar paraspinal muscle spasm Likely muscle spasm secondary to lumbar strain.  I have refilled the tizanidine.  Caution sedation.  I also recommended that she should discuss her use of meloxicam and Eliquis with her PCP and cardiologist.  Given her age and increased risk for bleeds, would consider potentially weaning off of meloxicam if she is able to.  Caution GI side effects/bleed given her use of NSAID, Eliquis and prednisone.  Home exercise program and stretches was provided to the patient.  Reasons for return and emergent evaluation  emergency department discussed.  Patient was good understanding.  Follow-up as needed.    Janora Norlander, DO [place]

## 2018-04-10 ENCOUNTER — Emergency Department (HOSPITAL_COMMUNITY)
Admission: EM | Admit: 2018-04-10 | Discharge: 2018-04-11 | Disposition: A | Discharge status: Home / Self Care | Payer: Medicare HMO | Attending: Emergency Medicine | Admitting: Emergency Medicine

## 2018-04-10 ENCOUNTER — Encounter (HOSPITAL_COMMUNITY): Payer: Self-pay | Admitting: Emergency Medicine

## 2018-04-10 DIAGNOSIS — J449 Chronic obstructive pulmonary disease, unspecified: Secondary | ICD-10-CM | POA: Insufficient documentation

## 2018-04-10 DIAGNOSIS — Z7901 Long term (current) use of anticoagulants: Secondary | ICD-10-CM | POA: Diagnosis not present

## 2018-04-10 DIAGNOSIS — E7800 Pure hypercholesterolemia, unspecified: Secondary | ICD-10-CM | POA: Insufficient documentation

## 2018-04-10 DIAGNOSIS — I480 Paroxysmal atrial fibrillation: Secondary | ICD-10-CM | POA: Insufficient documentation

## 2018-04-10 DIAGNOSIS — M545 Low back pain, unspecified: Secondary | ICD-10-CM

## 2018-04-10 DIAGNOSIS — Z87891 Personal history of nicotine dependence: Secondary | ICD-10-CM | POA: Diagnosis not present

## 2018-04-10 DIAGNOSIS — Z79899 Other long term (current) drug therapy: Secondary | ICD-10-CM | POA: Diagnosis not present

## 2018-04-10 DIAGNOSIS — I4892 Unspecified atrial flutter: Secondary | ICD-10-CM | POA: Diagnosis not present

## 2018-04-10 NOTE — ED Triage Notes (Addendum)
Pt reports intermittent back pain for a few weeks, seen by PCP already for same. States tonight she lost control of bladder, reports her PCP told her to come to ED for this. Taking muscle relaxer's, tylenol and prednisone at home. Denies urinary s/s.

## 2018-04-11 MED ORDER — LIDOCAINE 5 % EX PTCH
1.0000 | MEDICATED_PATCH | CUTANEOUS | Status: DC
  Administered 2018-04-11: 1 via TRANSDERMAL
  Filled 2018-04-11: qty 1

## 2018-04-11 MED ORDER — METHOCARBAMOL 1000 MG/10ML IJ SOLN
500.0000 mg | Freq: Once | INTRAVENOUS | Status: AC
  Administered 2018-04-11: 500 mg via INTRAVENOUS
  Filled 2018-04-11: qty 5

## 2018-04-11 MED ORDER — ONDANSETRON HCL 4 MG/2ML IJ SOLN
4.0000 mg | Freq: Once | INTRAMUSCULAR | Status: AC
  Administered 2018-04-11: 4 mg via INTRAVENOUS
  Filled 2018-04-11: qty 2

## 2018-04-11 MED ORDER — DEXAMETHASONE SODIUM PHOSPHATE 10 MG/ML IJ SOLN
10.0000 mg | Freq: Once | INTRAMUSCULAR | Status: AC
  Administered 2018-04-11: 10 mg via INTRAVENOUS
  Filled 2018-04-11: qty 1

## 2018-04-11 MED ORDER — HYDROMORPHONE HCL 2 MG/ML IJ SOLN
0.5000 mg | Freq: Once | INTRAMUSCULAR | Status: AC
  Administered 2018-04-11: 0.5 mg via INTRAVENOUS
  Filled 2018-04-11: qty 1

## 2018-04-11 MED ORDER — HYDROCODONE-ACETAMINOPHEN 5-325 MG PO TABS: 1.0000 | ORAL_TABLET | Freq: Four times a day (QID) | ORAL | 0 refills | Status: DC | PRN

## 2018-04-11 NOTE — ED Notes (Signed)
Assisted patient with voiding on bedpain - refused to ambulate to bathroom. Pre void volume -90ML. Post void volume -0ML

## 2018-04-11 NOTE — ED Notes (Signed)
ED Provider at bedside. 

## 2018-04-11 NOTE — ED Provider Notes (Signed)
[facility] EMERGENCY DEPARTMENT Provider Note   CSN: [id number] Arrival date & time: 04/10/18  2105    History   Chief Complaint Chief Complaint  Patient presents with  . Back Pain    HPI [name] is a 67 y.o. female.  67 year old female with a history of COPD, atrial fibrillation on chronic Eliquis, reflux, obesity presents to the emergency department for back pain.  She states that she turned abruptly 2 days ago while putting on a pair of shoes.  She has had constant, waxing and waning right low back pain since this time.  She believes that her symptoms are due to spasms.  They are aggravated with movement.  She is currently on prednisone as well as Zanaflex for management.  This has been providing little relief.  The patient states that she was sitting on the couch tonight with an urge to void.  She had an episode of urinary incontinence while ambulating to the bathroom.  She does endorse a history of stress incontinence with some leakage when sneezing or coughing.  She has not had any bowel incontinence, genital numbness, extremity numbness or paresthesias.  No fevers.     Past Medical History:  Diagnosis Date  . COPD (chronic obstructive pulmonary disease) ([place])   . GERD (gastroesophageal reflux disease)   . Hypercholesteremia   . Mild mitral regurgitation   . Mitral valve prolapse    a. dx 1985, not seen on echoes more recently.  . Obesity   . Paroxysmal atrial fibrillation (HCC)   . Paroxysmal atrial flutter ([place])    a. dx 05/2017.    Patient Active Problem List   Diagnosis Date Noted  . Sacroiliitis ([place]) 08/18/2017  . Multiple pulmonary nodules 09/07/2013  . Atrial fibrillation ([place]) 08/30/2013  . Mitral valve disease 08/30/2013  . Hypokalemia 08/16/2013  . Anemia 08/16/2013  . Leukocytosis, unspecified 08/16/2013  . COPD  GOLD III 08/14/2013  . Obesity, unspecified 08/14/2013  . Hypercholesteremia 08/14/2013    Past Surgical  History:  Procedure Laterality Date  . ABDOMINAL HYSTERECTOMY    . CARDIOVERSION N/A 10/08/2017   Procedure: CARDIOVERSION;  Surgeon: Herminio Commons, MD;  Location: [facility] ENDO SUITE;  Service: Cardiovascular;  Laterality: N/A;  . CESAREAN SECTION    . TUBAL LIGATION       OB History   None      Home Medications    Prior to Admission medications   Medication Sig Start Date End Date Taking? Authorizing Provider  albuterol (PROAIR HFA) 108 (90 Base) MCG/ACT inhaler Inhale 2 puffs into the lungs every 6 (six) hours as needed for wheezing or shortness of breath. 04/01/17  Yes Terald Sleeper, PA-C  amiodarone (PACERONE) 200 MG tablet Take 1 tablet (200 mg total) by mouth daily. 01/28/18  Yes Herminio Commons, MD  Cholecalciferol (VITAMIN D3) 5000 UNITS CAPS Take 5,000 Units by mouth daily.    Yes [provider]  CRANBERRY PO Take 4,200 mg by mouth daily.   Yes [provider]  docusate sodium (COLACE) 100 MG capsule Take 100 mg by mouth daily as needed for mild constipation.   Yes [provider]  ELIQUIS 5 MG TABS tablet TAKE (1) TABLET TWICE A DAY. 02/25/18  Yes Terald Sleeper, PA-C  Fluticasone-Umeclidin-Vilant (TRELEGY ELLIPTA) 100-62.5-25 MCG/INH AEPB Inhale 1 Units into the lungs daily. 02/10/18  Yes Terald Sleeper, PA-C  guaiFENesin (MUCINEX) 600 MG 12 hr tablet Take 600 mg by mouth every  morning.  08/16/13  Yes Black, Lezlie Octave, NP  meloxicam (MOBIC) 7.5 MG tablet Take 1 tablet (7.5 mg total) by mouth daily. 04/01/17  Yes Terald Sleeper, PA-C  metoprolol tartrate (LOPRESSOR) 50 MG tablet Take 25 mg by mouth 2 (two) times daily.   Yes [provider]  montelukast (SINGULAIR) 10 MG tablet Take 1 tablet (10 mg total) by mouth at bedtime. 03/01/18  Yes Terald Sleeper, PA-C  ranitidine (ZANTAC) 150 MG tablet Take 150 mg by mouth daily as needed for heartburn.   Yes [provider]  tiZANidine (ZANAFLEX) 2 MG tablet Take 1 tablet (2 mg total) by  mouth every 8 (eight) hours as needed for muscle spasms. 04/09/18  Yes Gottschalk, Ashly M, DO  VASCEPA 1 g CAPS TAKE (2) CAPSULES TWICE DAILY. Patient taking differently: TAKE (1) CAPSULES TWICE DAILY. 11/20/17  Yes Dettinger, Fransisca Kaufmann, MD  HYDROcodone-acetaminophen (NORCO/VICODIN) 5-325 MG tablet Take 1 tablet by mouth every 6 (six) hours as needed for severe pain. 04/11/18   [name], PA-C    Family History Family History  Problem Relation Age of Onset  . Heart attack Mother 71  . Asthma Mother   . Emphysema Father        smoked  . Atrial fibrillation Brother   . Hypertension Son     Social History Social History   Tobacco Use  . Smoking status: Former Smoker    Packs/day: 1.00    Years: 20.00    Pack years: 20.00    Types: Cigarettes    Last attempt to quit: 08/14/1990    Years since quitting: 27.6  . Smokeless tobacco: Never Used  Substance Use Topics  . Alcohol use: No    Alcohol/week: 0.0 oz  . Drug use: No     Allergies   Penicillins   Review of Systems Review of Systems Ten systems reviewed and are negative for acute change, except as noted in the HPI.    Physical Exam Updated Vital Signs BP (!) 137/94   Pulse 88   Temp 98.4 F (36.9 C) (Oral)   Resp 14   Ht 5\' 6"  (1.676 m)   Wt 96.2 kg (212 lb)   SpO2 96%   BMI 34.22 kg/m   Physical Exam  Constitutional: She is oriented to person, place, and time. She appears well-developed and well-nourished. No distress.  Intermittently uncomfortable, aggravated with movement. Nontoxic.  HENT:  Head: Normocephalic and atraumatic.  Eyes: Conjunctivae and EOM are normal. No scleral icterus.  Neck: Normal range of motion.  Cardiovascular: Normal rate, regular rhythm and intact distal pulses.  Pulmonary/Chest: Effort normal. No respiratory distress.  Respirations even and unlabored  Genitourinary:  Genitourinary Comments: Normal rectal tone on chaperoned DRE.  Musculoskeletal: Normal range of motion.  No  tenderness to palpation to the lumbosacral midline.  There is tenderness to the right lumbar paraspinal muscles with intermittent spasm.  Neurological: She is alert and oriented to person, place, and time. She exhibits normal muscle tone. Coordination normal.  Sensation to light touch intact in bilateral lower extremities.  Normal reflexes; reflexes equal bilaterally.  Skin: Skin is warm and dry. No rash noted. She is not diaphoretic. No erythema. No pallor.  Psychiatric: She has a normal mood and affect. Her behavior is normal.  Nursing note and vitals reviewed.    ED Treatments / Results  Labs (all labs ordered are listed, but only abnormal results are displayed) Labs Reviewed - No data to display  EKG None  Radiology No results found.  Procedures Procedures (including critical care time)  Medications Ordered in ED Medications  lidocaine (LIDODERM) 5 % 1 patch (1 patch Transdermal Patch Applied 04/11/18 0127)  dexamethasone (DECADRON) injection 10 mg (10 mg Intravenous Given 04/11/18 0128)  HYDROmorphone (DILAUDID) injection 0.5 mg (0.5 mg Intravenous Given 04/11/18 0128)  ondansetron (ZOFRAN) injection 4 mg (4 mg Intravenous Given 04/11/18 0128)  methocarbamol (ROBAXIN) 500 mg in dextrose 5 % 50 mL IVPB (0 mg Intravenous Stopped 04/11/18 0422)    1:32 AM Bladder scan with 52mL urine and bladder.  Postvoid residual 12mL.  4:59 AM Patient feels as though her pain is improved slightly.  She is able to ambulate to the bathroom with steady gait.   Initial Impression / Assessment and Plan / ED Course  I have reviewed the triage vital signs and the nursing notes.  Pertinent labs & imaging results that were available during my care of the patient were reviewed by me and considered in my medical decision making (see chart for details).     67 year old female presents to the emergency department for evaluation of right low back pain.  This worsens intermittently and is consistent with  spasm.  The patient was advised to come to the emergency department after an episode of urinary incontinence; however, based on history and discussion with the patient this is more consistent with an episode of urgency incontinence.  The patient is neurovascularly intact on exam.  She is afebrile, ambulatory.  Normal rectal tone noted with preserved lower extremity strength and sensation.  She has been medically managed in the emergency department with pain medication, IV Robaxin, IV Decadron.  She was given a Lidoderm patch for topical application.  On repeat evaluation, patient has had some improvement to her back pain.  I have encouraged the patient to continue with supportive care.  There was a long discussion with the patient about imaging in the ED tonight.  Discussed the risks and benefits of MRI.  Patient comfortable with holding any additional imaging at this time.  This discussion was had between the patient and my attending, Dr. Dina Rich.  Patient to be discharged with a course of Norco.  She has prednisone and Zanaflex at home for inflammation and muscle spasms.  Strict return precautions discussed and provided. Patient discharged in stable condition with no unaddressed concerns.   Final Clinical Impressions(s) / ED Diagnoses   Final diagnoses:  Acute right-sided low back pain without sciatica    ED Discharge Orders        Ordered    HYDROcodone-acetaminophen (NORCO/VICODIN) 5-325 MG tablet  Every 6 hours PRN     04/11/18 0542       [name], PA-C 04/11/18 1308    Merryl Hacker, MD 04/15/18 [phone]

## 2018-04-11 NOTE — Discharge Instructions (Signed)
Continue your prednisone and Tizanidine for symptoms management. You may supplement this with Norco as needed. Follow up with your primary care doctor for recheck of your symptoms. You may also benefit from Orthopedic follow up. Return to the ED for new or concerning symptoms - including, but not limited to, genital numbness, extremity numbness or weakness, bowel or bladder incontinence, inability to walk, fevers.

## 2018-04-12 ENCOUNTER — Ambulatory Visit (INDEPENDENT_AMBULATORY_CARE_PROVIDER_SITE_OTHER): Payer: Medicare HMO | Admitting: Physician Assistant

## 2018-04-12 ENCOUNTER — Encounter: Payer: Self-pay | Admitting: Physician Assistant

## 2018-04-12 VITALS — BP 93/60 | HR 52 | Temp 99.0°F | Ht 66.0 in | Wt 214.0 lb

## 2018-04-12 DIAGNOSIS — M62830 Muscle spasm of back: Secondary | ICD-10-CM

## 2018-04-12 DIAGNOSIS — M5417 Radiculopathy, lumbosacral region: Secondary | ICD-10-CM | POA: Diagnosis not present

## 2018-04-12 MED ORDER — PREDNISONE 10 MG PO TABS: 10.0000 mg | ORAL_TABLET | Freq: Every day | ORAL | 1 refills | Status: DC

## 2018-04-12 MED ORDER — METOPROLOL TARTRATE 50 MG PO TABS: 25.0000 mg | ORAL_TABLET | Freq: Two times a day (BID) | ORAL | 1 refills | Status: DC

## 2018-04-12 MED ORDER — HYDROCODONE-ACETAMINOPHEN 5-325 MG PO TABS: 1.0000 | ORAL_TABLET | Freq: Four times a day (QID) | ORAL | 0 refills | Status: DC | PRN

## 2018-04-13 DIAGNOSIS — M62830 Muscle spasm of back: Secondary | ICD-10-CM | POA: Insufficient documentation

## 2018-04-13 DIAGNOSIS — M5417 Radiculopathy, lumbosacral region: Secondary | ICD-10-CM | POA: Insufficient documentation

## 2018-04-13 NOTE — Progress Notes (Signed)
BP 93/60   Pulse (!) 52   Temp 99 F (37.2 C) (Oral)   Ht 5\' 6"  (1.676 m)   Wt 214 lb (97.1 kg)   BMI 34.54 kg/m    Subjective:    Patient ID: [name], female    DOB: 1951-03-08, 67 y.o.   MRN: [id number]  HPI: [name] is a 67 y.o. female presenting on 04/12/2018 for Back Pain (went to ER Saturday )  Patient has had recurrent low back pain with radiation down the right leg.  Is going on for several weeks.  There is not been full resolution whatsoever.  Over the weekend it got much worse.  Severe pain down the right leg.  She even has some difficulty with walking.  And she lost control of her bladder once.  She was seen through the emergency room.  She was given some medications but they have had limited benefit.  We discussed the possibility of degenerative disc or arthritis changes that may be impinging a nerve.  She is willing to get an MRI.  Past Medical History:  Diagnosis Date  . COPD (chronic obstructive pulmonary disease) ([place])   . GERD (gastroesophageal reflux disease)   . Hypercholesteremia   . Mild mitral regurgitation   . Mitral valve prolapse    a. dx 1985, not seen on echoes more recently.  . Obesity   . Paroxysmal atrial fibrillation ([facility])   . Paroxysmal atrial flutter ([place])    a. dx 05/2017.   Relevant past medical, surgical, family and social history reviewed and updated as indicated. Interim medical history since our last visit reviewed. Allergies and medications reviewed and updated. DATA REVIEWED: CHART IN EPIC  Family History reviewed for pertinent findings.  Review of Systems  Constitutional: Negative.  Negative for activity change, fatigue and fever.  HENT: Negative.   Eyes: Negative.   Respiratory: Negative.  Negative for cough.   Cardiovascular: Negative.  Negative for chest pain.  Gastrointestinal: Negative.  Negative for abdominal pain.  Endocrine: Negative.   Genitourinary: Negative.  Negative for dysuria.  Musculoskeletal:  Positive for arthralgias, back pain, gait problem, joint swelling and myalgias.  Skin: Negative.     Allergies as of 04/12/2018      Reactions   Penicillins Rash   Has patient had a PCN reaction causing immediate rash, facial/tongue/throat swelling, SOB or lightheadedness with hypotension: Yes Has patient had a PCN reaction causing severe rash involving mucus membranes or skin necrosis: Yes Has patient had a PCN reaction that required hospitalization: No Has patient had a PCN reaction occurring within the last 10 years: No If all of the above answers are "NO", then may proceed with Cephalosporin use.      Medication List        Accurate as of 04/12/18 11:59 PM. Always use your most recent med list.          albuterol 108 (90 Base) MCG/ACT inhaler Commonly known as:  PROAIR HFA Inhale 2 puffs into the lungs every 6 (six) hours as needed for wheezing or shortness of breath.   amiodarone 200 MG tablet Commonly known as:  PACERONE Take 1 tablet (200 mg total) by mouth daily.   CRANBERRY PO Take 4,200 mg by mouth daily.   docusate sodium 100 MG capsule Commonly known as:  COLACE Take 100 mg by mouth daily as needed for mild constipation.   ELIQUIS 5 MG Tabs tablet Generic drug:  apixaban TAKE (1) TABLET TWICE A  DAY.   Fluticasone-Umeclidin-Vilant 100-62.5-25 MCG/INH Aepb Commonly known as:  TRELEGY ELLIPTA Inhale 1 Units into the lungs daily.   guaiFENesin 600 MG 12 hr tablet Commonly known as:  MUCINEX Take 600 mg by mouth every morning.   HYDROcodone-acetaminophen 5-325 MG tablet Commonly known as:  NORCO/VICODIN Take 1 tablet by mouth every 6 (six) hours as needed for severe pain.   metoprolol tartrate 50 MG tablet Commonly known as:  LOPRESSOR Take 0.5 tablets (25 mg total) by mouth 2 (two) times daily.   montelukast 10 MG tablet Commonly known as:  SINGULAIR Take 1 tablet (10 mg total) by mouth at bedtime.   predniSONE 10 MG tablet Commonly known as:   DELTASONE Take 1 tablet (10 mg total) by mouth daily with breakfast.   ranitidine 150 MG tablet Commonly known as:  ZANTAC Take 150 mg by mouth daily as needed for heartburn.   tiZANidine 2 MG tablet Commonly known as:  ZANAFLEX Take 1 tablet (2 mg total) by mouth every 8 (eight) hours as needed for muscle spasms.   VASCEPA 1 g Caps Generic drug:  Icosapent Ethyl TAKE (2) CAPSULES TWICE DAILY.   Vitamin D3 5000 units Caps Take 5,000 Units by mouth daily.          Objective:    BP 93/60   Pulse (!) 52   Temp 99 F (37.2 C) (Oral)   Ht 5\' 6"  (1.676 m)   Wt 214 lb (97.1 kg)   BMI 34.54 kg/m   Allergies  Allergen Reactions  . Penicillins Rash    Has patient had a PCN reaction causing immediate rash, facial/tongue/throat swelling, SOB or lightheadedness with hypotension: Yes Has patient had a PCN reaction causing severe rash involving mucus membranes or skin necrosis: Yes Has patient had a PCN reaction that required hospitalization: No Has patient had a PCN reaction occurring within the last 10 years: No If all of the above answers are "NO", then may proceed with Cephalosporin use.     Wt Readings from Last 3 Encounters:  04/12/18 214 lb (97.1 kg)  04/10/18 212 lb (96.2 kg)  04/09/18 214 lb (97.1 kg)    Physical Exam  Constitutional: She is oriented to person, place, and time. She appears well-developed and well-nourished.  HENT:  Head: Normocephalic and atraumatic.  Eyes: Pupils are equal, round, and reactive to light. Conjunctivae and EOM are normal.  Cardiovascular: Normal rate, regular rhythm, normal heart sounds and intact distal pulses.  Pulmonary/Chest: Effort normal and breath sounds normal.  Abdominal: Soft. Bowel sounds are normal.  Musculoskeletal:       Lumbar back: She exhibits decreased range of motion, tenderness, laceration, pain and spasm.       Back:  Neurological: She is alert and oriented to person, place, and time. She has normal reflexes.    Skin: Skin is warm and dry. No rash noted.  Psychiatric: She has a normal mood and affect. Her behavior is normal. Judgment and thought content normal.        Assessment & Plan:   1. Lumbar paraspinal muscle spasm - MR Lumbar Spine Wo Contrast; Future - HYDROcodone-acetaminophen (NORCO/VICODIN) 5-325 MG tablet; Take 1 tablet by mouth every 6 (six) hours as needed for severe pain.  Dispense: 40 tablet; Refill: 0 - predniSONE (DELTASONE) 10 MG tablet; Take 1 tablet (10 mg total) by mouth daily with breakfast.  Dispense: 30 tablet; Refill: 1  2. Lumbosacral radiculopathy RIGHT - MR Lumbar Spine Wo Contrast; Future - HYDROcodone-acetaminophen (  NORCO/VICODIN) 5-325 MG tablet; Take 1 tablet by mouth every 6 (six) hours as needed for severe pain.  Dispense: 40 tablet; Refill: 0 - predniSONE (DELTASONE) 10 MG tablet; Take 1 tablet (10 mg total) by mouth daily with breakfast.  Dispense: 30 tablet; Refill: 1   Continue all other maintenance medications as listed above.  Follow up plan: No follow-ups on file.  Educational handout given for [place] PA-C [place] [address]  [place], [place] [address] [phone]   04/13/2018, 11:29 AM

## 2018-04-23 ENCOUNTER — Ambulatory Visit (HOSPITAL_COMMUNITY)
Admission: RE | Admit: 2018-04-23 | Discharge: 2018-04-23 | Disposition: A | Discharge status: Home / Self Care | Payer: Medicare HMO | Source: Ambulatory Visit | Attending: Physician Assistant | Admitting: Physician Assistant

## 2018-04-23 DIAGNOSIS — M62830 Muscle spasm of back: Secondary | ICD-10-CM | POA: Insufficient documentation

## 2018-04-23 DIAGNOSIS — M4726 Other spondylosis with radiculopathy, lumbar region: Secondary | ICD-10-CM | POA: Diagnosis not present

## 2018-04-23 DIAGNOSIS — M545 Low back pain: Secondary | ICD-10-CM | POA: Diagnosis not present

## 2018-04-23 DIAGNOSIS — M5417 Radiculopathy, lumbosacral region: Secondary | ICD-10-CM | POA: Diagnosis present

## 2018-05-03 ENCOUNTER — Other Ambulatory Visit: Payer: Self-pay | Admitting: Physician Assistant

## 2018-05-03 ENCOUNTER — Ambulatory Visit (HOSPITAL_BASED_OUTPATIENT_CLINIC_OR_DEPARTMENT_OTHER): Payer: Medicare HMO

## 2018-05-03 ENCOUNTER — Encounter: Payer: Self-pay | Admitting: Physician Assistant

## 2018-05-03 ENCOUNTER — Other Ambulatory Visit: Payer: Self-pay

## 2018-05-03 ENCOUNTER — Encounter (HOSPITAL_COMMUNITY): Payer: Self-pay | Admitting: *Deleted

## 2018-05-03 ENCOUNTER — Encounter (HOSPITAL_COMMUNITY)
Admission: RE | Disposition: A | Discharge status: Home / Self Care | Payer: Self-pay | Source: Ambulatory Visit | Attending: Internal Medicine

## 2018-05-03 ENCOUNTER — Ambulatory Visit (HOSPITAL_COMMUNITY)
Admission: RE | Admit: 2018-05-03 | Discharge: 2018-05-03 | Disposition: A | Discharge status: Home / Self Care | Payer: Medicare HMO | Source: Ambulatory Visit | Attending: Internal Medicine | Admitting: Internal Medicine

## 2018-05-03 DIAGNOSIS — Z79899 Other long term (current) drug therapy: Secondary | ICD-10-CM | POA: Diagnosis not present

## 2018-05-03 DIAGNOSIS — Z9889 Other specified postprocedural states: Secondary | ICD-10-CM | POA: Diagnosis not present

## 2018-05-03 DIAGNOSIS — I340 Nonrheumatic mitral (valve) insufficiency: Secondary | ICD-10-CM | POA: Diagnosis not present

## 2018-05-03 DIAGNOSIS — Z87891 Personal history of nicotine dependence: Secondary | ICD-10-CM | POA: Insufficient documentation

## 2018-05-03 DIAGNOSIS — E669 Obesity, unspecified: Secondary | ICD-10-CM | POA: Insufficient documentation

## 2018-05-03 DIAGNOSIS — J449 Chronic obstructive pulmonary disease, unspecified: Secondary | ICD-10-CM | POA: Diagnosis not present

## 2018-05-03 DIAGNOSIS — I4891 Unspecified atrial fibrillation: Secondary | ICD-10-CM

## 2018-05-03 DIAGNOSIS — I341 Nonrheumatic mitral (valve) prolapse: Secondary | ICD-10-CM | POA: Diagnosis not present

## 2018-05-03 DIAGNOSIS — Z90710 Acquired absence of both cervix and uterus: Secondary | ICD-10-CM | POA: Diagnosis not present

## 2018-05-03 DIAGNOSIS — I480 Paroxysmal atrial fibrillation: Secondary | ICD-10-CM | POA: Insufficient documentation

## 2018-05-03 DIAGNOSIS — K219 Gastro-esophageal reflux disease without esophagitis: Secondary | ICD-10-CM | POA: Insufficient documentation

## 2018-05-03 DIAGNOSIS — E7800 Pure hypercholesterolemia, unspecified: Secondary | ICD-10-CM | POA: Diagnosis not present

## 2018-05-03 DIAGNOSIS — Z7902 Long term (current) use of antithrombotics/antiplatelets: Secondary | ICD-10-CM | POA: Insufficient documentation

## 2018-05-03 DIAGNOSIS — Z8249 Family history of ischemic heart disease and other diseases of the circulatory system: Secondary | ICD-10-CM | POA: Insufficient documentation

## 2018-05-03 DIAGNOSIS — Z7951 Long term (current) use of inhaled steroids: Secondary | ICD-10-CM | POA: Diagnosis not present

## 2018-05-03 DIAGNOSIS — Z6834 Body mass index (BMI) 34.0-34.9, adult: Secondary | ICD-10-CM | POA: Insufficient documentation

## 2018-05-03 DIAGNOSIS — Z825 Family history of asthma and other chronic lower respiratory diseases: Secondary | ICD-10-CM | POA: Diagnosis not present

## 2018-05-03 DIAGNOSIS — I700 Atherosclerosis of aorta: Secondary | ICD-10-CM | POA: Insufficient documentation

## 2018-05-03 DIAGNOSIS — I4892 Unspecified atrial flutter: Secondary | ICD-10-CM | POA: Insufficient documentation

## 2018-05-03 DIAGNOSIS — Z880 Allergy status to penicillin: Secondary | ICD-10-CM | POA: Diagnosis not present

## 2018-05-03 HISTORY — PX: TEE WITHOUT CARDIOVERSION: SHX5443

## 2018-05-03 SURGERY — ECHOCARDIOGRAM, TRANSESOPHAGEAL
Anesthesia: Moderate Sedation

## 2018-05-03 MED ORDER — MIDAZOLAM HCL 10 MG/2ML IJ SOLN
INTRAMUSCULAR | Status: DC | PRN
  Administered 2018-05-03: 2 mg via INTRAVENOUS
  Administered 2018-05-03: 1 mg via INTRAVENOUS
  Administered 2018-05-03 (×2): 2 mg via INTRAVENOUS

## 2018-05-03 MED ORDER — FENTANYL CITRATE (PF) 100 MCG/2ML IJ SOLN
INTRAMUSCULAR | Status: AC
  Filled 2018-05-03: qty 2

## 2018-05-03 MED ORDER — LIDOCAINE VISCOUS HCL 2 % MT SOLN
OROMUCOSAL | Status: DC | PRN
  Administered 2018-05-03: 20 mL via OROMUCOSAL

## 2018-05-03 MED ORDER — SODIUM CHLORIDE 0.9 % IV SOLN
INTRAVENOUS | Status: DC
  Administered 2018-05-03: 09:00:00 via INTRAVENOUS

## 2018-05-03 MED ORDER — DOXYCYCLINE HYCLATE 100 MG PO TABS: 100.0000 mg | ORAL_TABLET | Freq: Two times a day (BID) | ORAL | 0 refills | Status: DC

## 2018-05-03 MED ORDER — DIPHENHYDRAMINE HCL 50 MG/ML IJ SOLN
INTRAMUSCULAR | Status: AC
  Filled 2018-05-03: qty 1

## 2018-05-03 MED ORDER — FENTANYL CITRATE (PF) 100 MCG/2ML IJ SOLN
INTRAMUSCULAR | Status: DC | PRN
  Administered 2018-05-03 (×2): 25 ug via INTRAVENOUS

## 2018-05-03 MED ORDER — MIDAZOLAM HCL 5 MG/ML IJ SOLN
INTRAMUSCULAR | Status: AC
  Filled 2018-05-03: qty 2

## 2018-05-03 MED ORDER — LIDOCAINE VISCOUS HCL 2 % MT SOLN
OROMUCOSAL | Status: AC
  Filled 2018-05-03: qty 15

## 2018-05-03 NOTE — CV Procedure (Signed)
TEE  LA, LA appendage without masses  TV normal   Trace TR PV normal  AV mildly thickened.  No AI MV normal   MOderate to severe MR RV appears mildly dilated   RVEF may be mildly depressed LVEF is grossly normal  Mild atherosclerotic plaquing in aorta.

## 2018-05-03 NOTE — Discharge Instructions (Signed)

## 2018-05-03 NOTE — H&P (Signed)
Primary Care Physician: Terald Sleeper, PA-C Referring Physician:Dr. Rayann Heman   [name] is a 67 y.o. female with a h/o afib/flutter that was seen by Dr. Rayann Heman   Plan for ablation       Past Medical History:  Diagnosis Date  . COPD (chronic obstructive pulmonary disease) ([place])   . GERD (gastroesophageal reflux disease)   . Hypercholesteremia   . Mild mitral regurgitation   . Mitral valve prolapse    a. dx 1985, not seen on echoes more recently.  . Obesity   . Paroxysmal atrial fibrillation ([facility])   . Paroxysmal atrial flutter ([place])    a. dx 05/2017.        Past Surgical History:  Procedure Laterality Date  . ABDOMINAL HYSTERECTOMY    . CARDIOVERSION N/A 10/08/2017   Procedure: CARDIOVERSION;  Surgeon: Herminio Commons, MD;  Location: [facility] ENDO SUITE;  Service: Cardiovascular;  Laterality: N/A;  . CESAREAN SECTION    . TUBAL LIGATION            Current Outpatient Medications  Medication Sig Dispense Refill  . albuterol (PROAIR HFA) 108 (90 Base) MCG/ACT inhaler Inhale 2 puffs into the lungs every 6 (six) hours as needed for wheezing or shortness of breath. 1 Inhaler 11  . amiodarone (PACERONE) 200 MG tablet Take 1 tablet (200 mg total) by mouth daily. 90 tablet 1  . Cholecalciferol (VITAMIN D3) 5000 UNITS CAPS Take 5,000 Units by mouth daily.     Marland Kitchen CRANBERRY PO Take 4,200 mg by mouth daily.    Marland Kitchen docusate sodium (COLACE) 100 MG capsule Take 100 mg by mouth daily as needed for mild constipation.    Marland Kitchen ELIQUIS 5 MG TABS tablet TAKE (1) TABLET TWICE A DAY. 60 tablet 2  . Fluticasone-Umeclidin-Vilant (TRELEGY ELLIPTA) 100-62.5-25 MCG/INH AEPB Inhale 1 Units into the lungs daily. 60 each 11  . guaiFENesin (MUCINEX) 600 MG 12 hr tablet Take 600 mg by mouth every morning.     . meloxicam (MOBIC) 7.5 MG tablet Take 1 tablet (7.5 mg total) by mouth daily. 90 tablet 3  . metoprolol tartrate (LOPRESSOR) 50 MG tablet Take 25 mg by mouth 2 (two)  times daily.    . montelukast (SINGULAIR) 10 MG tablet Take 1 tablet (10 mg total) by mouth at bedtime. 90 tablet 0  . ranitidine (ZANTAC) 150 MG tablet Take 150 mg by mouth daily as needed for heartburn.    Marland Kitchen VASCEPA 1 g CAPS TAKE (2) CAPSULES TWICE DAILY. 120 capsule 1   No current facility-administered medications for this encounter.          Allergies  Allergen Reactions  . Penicillins Rash    Has patient had a PCN reaction causing immediate rash, facial/tongue/throat swelling, SOB or lightheadedness with hypotension: Yes Has patient had a PCN reaction causing severe rash involving mucus membranes or skin necrosis: Yes Has patient had a PCN reaction that required hospitalization: No Has patient had a PCN reaction occurring within the last 10 years: No If all of the above answers are "NO", then may proceed with Cephalosporin use.     Social History        Socioeconomic History  . Marital status: Married    Spouse name: Not on file  . Number of children: 1  . Years of education: Not on file  . Highest education level: Not on file  Occupational History  . Occupation: Actuary  Social Needs  . Financial resource strain: Not  on file  . Food insecurity:    Worry: Not on file    Inability: Not on file  . Transportation needs:    Medical: Not on file    Non-medical: Not on file  Tobacco Use  . Smoking status: Former Smoker    Packs/day: 1.00    Years: 20.00    Pack years: 20.00    Types: Cigarettes    Last attempt to quit: 08/14/1990    Years since quitting: 27.6  . Smokeless tobacco: Never Used  Substance and Sexual Activity  . Alcohol use: No    Alcohol/week: 0.0 oz  . Drug use: No  . Sexual activity: Not Currently  Lifestyle  . Physical activity:    Days per week: Not on file    Minutes per session: Not on file  . Stress: Not on file  Relationships  . Social connections:    Talks on phone: Not on file    Gets  together: Not on file    Attends religious service: Not on file    Active member of club or organization: Not on file    Attends meetings of clubs or organizations: Not on file    Relationship status: Not on file  . Intimate partner violence:    Fear of current or ex partner: Not on file    Emotionally abused: Not on file    Physically abused: Not on file    Forced sexual activity: Not on file  Other Topics Concern  . Not on file  Social History Narrative  . Not on file         Family History  Problem Relation Age of Onset  . Heart attack Mother 34  . Asthma Mother   . Emphysema Father        smoked  . Atrial fibrillation Brother   . Hypertension Son     ROS- All systems are reviewed and negative except as per the HPI above  Physical Exam:    Vitals:   03/31/18 1106  BP: 128/84  Pulse: 63  Weight: 213 lb (96.6 kg)  Height: 5\' 6"  (1.676 m)      Wt Readings from Last 3 Encounters:  03/31/18 213 lb (96.6 kg)  03/10/18 211 lb 12.8 oz (96.1 kg)  02/23/18 212 lb (96.2 kg)    Labs: RecentLabs       Lab Results  Component Value Date   NA 142 10/22/2017   K 4.8 10/22/2017   CL 102 10/22/2017   CO2 29 10/22/2017   GLUCOSE 98 10/22/2017   BUN 18 10/22/2017   CREATININE 0.85 10/22/2017   CALCIUM 9.1 10/22/2017     RecentLabs       Lab Results  Component Value Date   INR 1.09 05/10/2017     RecentLabs       Lab Results  Component Value Date   CHOL 196 04/01/2017   HDL 45 04/01/2017   LDLCALC 119 (H) 04/01/2017   TRIG 161 (H) 04/01/2017       GEN- The patient is well appearing, alert and oriented x 3 today.   Head- normocephalic, atraumatic Eyes-  Sclera clear, conjunctiva pink Ears- hearing intact Oropharynx- clear Neck- supple, no JVP Lymph- no cervical lymphadenopathy Lungs- Clear to ausculation bilaterally, normal work of breathing Heart- irregular rate and rhythm, no murmurs, rubs or  gallops, PMI not laterally displaced GI- soft, NT, ND, + BS Extremities- no clubbing, cyanosis, or edema MS- no significant deformity or atrophy Skin-  no rash or lesion Psych- euthymic mood, full affect Neuro- strength and sensation are intact  EKG- Atrial flutter at 63 bpm, qrs int 98 ms, qtc 462 ms Epic records reviewed Echo-Study Conclusions  - Left ventricle: The cavity size was normal. Wall thickness was normal. Systolic function was normal. The estimated ejection fraction was in the range of 60% to 65%. Wall motion was normal; there were no regional wall motion abnormalities. Left ventricular diastolic function parameters were normal. - Aortic valve: Trileaflet; mildly thickened leaflets. - Mitral valve: There was mild regurgitation.   A/P     PT is a 67 yo with history of atrial flutter, atrial fib   Plan for ablation   TEE prior.  Procedure explained   Pt agrees to proceed.  Dorris Carnes

## 2018-05-03 NOTE — Progress Notes (Signed)
  Echocardiogram Echocardiogram Transesophageal has been performed.  [name] 05/03/2018, 10:21 AM

## 2018-05-04 ENCOUNTER — Encounter (HOSPITAL_COMMUNITY): Payer: Self-pay | Admitting: Anesthesiology

## 2018-05-04 ENCOUNTER — Encounter (HOSPITAL_COMMUNITY)
Admission: RE | Disposition: A | Discharge status: Home / Self Care | Payer: Self-pay | Source: Ambulatory Visit | Attending: Internal Medicine

## 2018-05-04 ENCOUNTER — Ambulatory Visit (HOSPITAL_COMMUNITY)
Admission: RE | Admit: 2018-05-04 | Discharge: 2018-05-05 | Disposition: A | Discharge status: Home / Self Care | Payer: Medicare HMO | Source: Ambulatory Visit | Attending: Internal Medicine | Admitting: Internal Medicine

## 2018-05-04 ENCOUNTER — Ambulatory Visit (HOSPITAL_COMMUNITY): Payer: Medicare HMO | Admitting: Anesthesiology

## 2018-05-04 ENCOUNTER — Other Ambulatory Visit: Payer: Self-pay

## 2018-05-04 DIAGNOSIS — Z87891 Personal history of nicotine dependence: Secondary | ICD-10-CM | POA: Insufficient documentation

## 2018-05-04 DIAGNOSIS — I4891 Unspecified atrial fibrillation: Secondary | ICD-10-CM | POA: Diagnosis present

## 2018-05-04 DIAGNOSIS — Z7901 Long term (current) use of anticoagulants: Secondary | ICD-10-CM | POA: Insufficient documentation

## 2018-05-04 DIAGNOSIS — Z7951 Long term (current) use of inhaled steroids: Secondary | ICD-10-CM | POA: Diagnosis not present

## 2018-05-04 DIAGNOSIS — I341 Nonrheumatic mitral (valve) prolapse: Secondary | ICD-10-CM | POA: Diagnosis not present

## 2018-05-04 DIAGNOSIS — I481 Persistent atrial fibrillation: Secondary | ICD-10-CM | POA: Diagnosis not present

## 2018-05-04 DIAGNOSIS — I483 Typical atrial flutter: Secondary | ICD-10-CM | POA: Diagnosis not present

## 2018-05-04 DIAGNOSIS — K219 Gastro-esophageal reflux disease without esophagitis: Secondary | ICD-10-CM | POA: Insufficient documentation

## 2018-05-04 DIAGNOSIS — J449 Chronic obstructive pulmonary disease, unspecified: Secondary | ICD-10-CM | POA: Insufficient documentation

## 2018-05-04 DIAGNOSIS — E669 Obesity, unspecified: Secondary | ICD-10-CM | POA: Insufficient documentation

## 2018-05-04 DIAGNOSIS — I10 Essential (primary) hypertension: Secondary | ICD-10-CM | POA: Insufficient documentation

## 2018-05-04 DIAGNOSIS — Z6834 Body mass index (BMI) 34.0-34.9, adult: Secondary | ICD-10-CM | POA: Insufficient documentation

## 2018-05-04 DIAGNOSIS — M1990 Unspecified osteoarthritis, unspecified site: Secondary | ICD-10-CM | POA: Diagnosis not present

## 2018-05-04 DIAGNOSIS — I4892 Unspecified atrial flutter: Secondary | ICD-10-CM | POA: Insufficient documentation

## 2018-05-04 DIAGNOSIS — E7800 Pure hypercholesterolemia, unspecified: Secondary | ICD-10-CM | POA: Insufficient documentation

## 2018-05-04 DIAGNOSIS — I480 Paroxysmal atrial fibrillation: Secondary | ICD-10-CM | POA: Diagnosis not present

## 2018-05-04 DIAGNOSIS — Z880 Allergy status to penicillin: Secondary | ICD-10-CM | POA: Insufficient documentation

## 2018-05-04 DIAGNOSIS — Z8249 Family history of ischemic heart disease and other diseases of the circulatory system: Secondary | ICD-10-CM | POA: Insufficient documentation

## 2018-05-04 HISTORY — PX: ATRIAL FIBRILLATION ABLATION: EP1191

## 2018-05-04 LAB — CBC WITH DIFFERENTIAL/PLATELET
ABS IMMATURE GRANULOCYTES: 0 10*3/uL (ref 0.0–0.1)
BASOS PCT: 1 %
Basophils Absolute: 0 10*3/uL (ref 0.0–0.1)
Eosinophils Absolute: 0.2 10*3/uL (ref 0.0–0.7)
Eosinophils Relative: 3 %
HCT: 44.6 % (ref 36.0–46.0)
Hemoglobin: 14.2 g/dL (ref 12.0–15.0)
Immature Granulocytes: 0 %
LYMPHS PCT: 26 %
Lymphs Abs: 1.5 10*3/uL (ref 0.7–4.0)
MCH: 29.2 pg (ref 26.0–34.0)
MCHC: 31.8 g/dL (ref 30.0–36.0)
MCV: 91.8 fL (ref 78.0–100.0)
MONOS PCT: 6 %
Monocytes Absolute: 0.4 10*3/uL (ref 0.1–1.0)
NEUTROS ABS: 3.7 10*3/uL (ref 1.7–7.7)
Neutrophils Relative %: 64 %
PLATELETS: 278 10*3/uL (ref 150–400)
RBC: 4.86 MIL/uL (ref 3.87–5.11)
RDW: 13.4 % (ref 11.5–15.5)
WBC: 5.7 10*3/uL (ref 4.0–10.5)

## 2018-05-04 LAB — BASIC METABOLIC PANEL
Anion gap: 8 (ref 5–15)
BUN: 10 mg/dL (ref 8–23)
CALCIUM: 9 mg/dL (ref 8.9–10.3)
CO2: 27 mmol/L (ref 22–32)
CREATININE: 0.89 mg/dL (ref 0.44–1.00)
Chloride: 106 mmol/L (ref 98–111)
GLUCOSE: 99 mg/dL (ref 70–99)
Potassium: 4.4 mmol/L (ref 3.5–5.1)
Sodium: 141 mmol/L (ref 135–145)

## 2018-05-04 LAB — POCT ACTIVATED CLOTTING TIME
ACTIVATED CLOTTING TIME: 285 s
ACTIVATED CLOTTING TIME: 191 s
Activated Clotting Time: 246 seconds
Activated Clotting Time: 323 seconds
Activated Clotting Time: 213 seconds
Activated Clotting Time: 202 seconds

## 2018-05-04 SURGERY — ATRIAL FIBRILLATION ABLATION
Anesthesia: General

## 2018-05-04 MED ORDER — MONTELUKAST SODIUM 10 MG PO TABS
10.0000 mg | ORAL_TABLET | Freq: Every day | ORAL | Status: DC
  Administered 2018-05-04: 10 mg via ORAL
  Filled 2018-05-04: qty 1

## 2018-05-04 MED ORDER — MIDAZOLAM HCL 5 MG/5ML IJ SOLN
INTRAMUSCULAR | Status: DC | PRN
  Administered 2018-05-04: 2 mg via INTRAVENOUS

## 2018-05-04 MED ORDER — PROPOFOL 10 MG/ML IV BOLUS
INTRAVENOUS | Status: DC | PRN
  Administered 2018-05-04: 70 mg via INTRAVENOUS
  Administered 2018-05-04 (×2): 40 mg via INTRAVENOUS
  Administered 2018-05-04: 20 mg via INTRAVENOUS

## 2018-05-04 MED ORDER — LIDOCAINE HCL (CARDIAC) PF 100 MG/5ML IV SOSY
PREFILLED_SYRINGE | INTRAVENOUS | Status: DC | PRN
  Administered 2018-05-04: 40 mg via INTRAVENOUS

## 2018-05-04 MED ORDER — BUPIVACAINE HCL (PF) 0.25 % IJ SOLN
INTRAMUSCULAR | Status: AC
  Filled 2018-05-04: qty 30

## 2018-05-04 MED ORDER — DEXAMETHASONE SODIUM PHOSPHATE 10 MG/ML IJ SOLN
INTRAMUSCULAR | Status: DC | PRN
  Administered 2018-05-04: 10 mg via INTRAVENOUS

## 2018-05-04 MED ORDER — IOPAMIDOL (ISOVUE-370) INJECTION 76%
INTRAVENOUS | Status: DC | PRN
  Administered 2018-05-04: 3 mL via INTRAVENOUS

## 2018-05-04 MED ORDER — SODIUM CHLORIDE 0.9 % IV SOLN
INTRAVENOUS | Status: DC
  Administered 2018-05-04 (×2): via INTRAVENOUS

## 2018-05-04 MED ORDER — HEPARIN SODIUM (PORCINE) 1000 UNIT/ML IJ SOLN
INTRAMUSCULAR | Status: DC | PRN
  Administered 2018-05-04: 5000 [IU] via INTRAVENOUS
  Administered 2018-05-04: 2000 [IU] via INTRAVENOUS
  Administered 2018-05-04: 4000 [IU] via INTRAVENOUS

## 2018-05-04 MED ORDER — FENTANYL CITRATE (PF) 100 MCG/2ML IJ SOLN
INTRAMUSCULAR | Status: DC | PRN
  Administered 2018-05-04: 50 ug via INTRAVENOUS

## 2018-05-04 MED ORDER — PROTAMINE SULFATE 10 MG/ML IV SOLN
INTRAVENOUS | Status: DC | PRN
  Administered 2018-05-04 (×2): 10 mg via INTRAVENOUS
  Administered 2018-05-04: 20 mg via INTRAVENOUS

## 2018-05-04 MED ORDER — AMIODARONE HCL 200 MG PO TABS
200.0000 mg | ORAL_TABLET | Freq: Every day | ORAL | Status: DC
  Administered 2018-05-05: 200 mg via ORAL
  Filled 2018-05-04: qty 1

## 2018-05-04 MED ORDER — HEPARIN SODIUM (PORCINE) 1000 UNIT/ML IJ SOLN
INTRAMUSCULAR | Status: AC
  Filled 2018-05-04: qty 1

## 2018-05-04 MED ORDER — DOXYCYCLINE HYCLATE 100 MG PO TABS
100.0000 mg | ORAL_TABLET | Freq: Two times a day (BID) | ORAL | Status: DC
  Administered 2018-05-04 – 2018-05-05 (×2): 100 mg via ORAL
  Filled 2018-05-04 (×2): qty 1

## 2018-05-04 MED ORDER — HEPARIN SODIUM (PORCINE) 1000 UNIT/ML IJ SOLN
INTRAMUSCULAR | Status: DC | PRN
  Administered 2018-05-04: 12000 [IU] via INTRAVENOUS
  Administered 2018-05-04 (×2): 1000 [IU] via INTRAVENOUS

## 2018-05-04 MED ORDER — ISOPROTERENOL HCL 0.2 MG/ML IJ SOLN
INTRAVENOUS | Status: DC | PRN
  Administered 2018-05-04: 10 ug/min via INTRAVENOUS

## 2018-05-04 MED ORDER — ISOPROTERENOL HCL 0.2 MG/ML IJ SOLN
INTRAMUSCULAR | Status: AC
  Filled 2018-05-04: qty 5

## 2018-05-04 MED ORDER — ACETAMINOPHEN 325 MG PO TABS
650.0000 mg | ORAL_TABLET | ORAL | Status: DC | PRN
  Filled 2018-05-04: qty 2

## 2018-05-04 MED ORDER — PHENYLEPHRINE HCL 10 MG/ML IJ SOLN
INTRAMUSCULAR | Status: DC | PRN
  Administered 2018-05-04: 80 ug via INTRAVENOUS
  Administered 2018-05-04: 40 ug via INTRAVENOUS
  Administered 2018-05-04: 80 ug via INTRAVENOUS
  Administered 2018-05-04 (×2): 40 ug via INTRAVENOUS
  Administered 2018-05-04: 80 ug via INTRAVENOUS
  Administered 2018-05-04: 40 ug via INTRAVENOUS

## 2018-05-04 MED ORDER — HYDROCODONE-ACETAMINOPHEN 5-325 MG PO TABS
1.0000 | ORAL_TABLET | ORAL | Status: DC | PRN
  Administered 2018-05-04: 1 via ORAL
  Filled 2018-05-04: qty 1

## 2018-05-04 MED ORDER — ONDANSETRON HCL 4 MG/2ML IJ SOLN
4.0000 mg | Freq: Four times a day (QID) | INTRAMUSCULAR | Status: DC | PRN
  Administered 2018-05-04: 4 mg via INTRAVENOUS

## 2018-05-04 MED ORDER — HEPARIN (PORCINE) IN NACL 1000-0.9 UT/500ML-% IV SOLN
INTRAVENOUS | Status: AC
  Filled 2018-05-04: qty 500

## 2018-05-04 MED ORDER — SODIUM CHLORIDE 0.9% FLUSH
3.0000 mL | Freq: Two times a day (BID) | INTRAVENOUS | Status: DC
  Administered 2018-05-04: 3 mL via INTRAVENOUS

## 2018-05-04 MED ORDER — ALBUTEROL SULFATE (2.5 MG/3ML) 0.083% IN NEBU: 2.5000 mg | INHALATION_SOLUTION | Freq: Four times a day (QID) | RESPIRATORY_TRACT | Status: DC | PRN

## 2018-05-04 MED ORDER — METOPROLOL TARTRATE 25 MG PO TABS
25.0000 mg | ORAL_TABLET | Freq: Two times a day (BID) | ORAL | Status: DC
  Administered 2018-05-05: 25 mg via ORAL
  Filled 2018-05-04: qty 1

## 2018-05-04 MED ORDER — ONDANSETRON HCL 4 MG/2ML IJ SOLN
INTRAMUSCULAR | Status: AC
  Filled 2018-05-04: qty 2

## 2018-05-04 MED ORDER — FAMOTIDINE 20 MG PO TABS
20.0000 mg | ORAL_TABLET | Freq: Every day | ORAL | Status: DC | PRN
  Administered 2018-05-04: 20 mg via ORAL
  Filled 2018-05-04: qty 1

## 2018-05-04 MED ORDER — IOPAMIDOL (ISOVUE-370) INJECTION 76%
INTRAVENOUS | Status: AC
  Filled 2018-05-04: qty 50

## 2018-05-04 MED ORDER — BUPIVACAINE HCL (PF) 0.25 % IJ SOLN
INTRAMUSCULAR | Status: DC | PRN
  Administered 2018-05-04: 30 mL

## 2018-05-04 MED ORDER — SUGAMMADEX SODIUM 200 MG/2ML IV SOLN
INTRAVENOUS | Status: DC | PRN
  Administered 2018-05-04 (×2): 100 mg via INTRAVENOUS

## 2018-05-04 MED ORDER — SODIUM CHLORIDE 0.9 % IV SOLN: 250.0000 mL | INTRAVENOUS | Status: DC | PRN

## 2018-05-04 MED ORDER — ROCURONIUM BROMIDE 100 MG/10ML IV SOLN
INTRAVENOUS | Status: DC | PRN
  Administered 2018-05-04: 20 mg via INTRAVENOUS
  Administered 2018-05-04: 50 mg via INTRAVENOUS
  Administered 2018-05-04: 10 mg via INTRAVENOUS

## 2018-05-04 MED ORDER — PHENYLEPHRINE HCL 10 MG/ML IJ SOLN
INTRAVENOUS | Status: DC | PRN
  Administered 2018-05-04: 50 ug/min via INTRAVENOUS

## 2018-05-04 MED ORDER — ONDANSETRON HCL 4 MG/2ML IJ SOLN
INTRAMUSCULAR | Status: DC | PRN
  Administered 2018-05-04: 4 mg via INTRAVENOUS

## 2018-05-04 MED ORDER — HYDROCODONE-ACETAMINOPHEN 5-325 MG PO TABS: 1.0000 | ORAL_TABLET | Freq: Four times a day (QID) | ORAL | Status: DC | PRN

## 2018-05-04 MED ORDER — APIXABAN 5 MG PO TABS
5.0000 mg | ORAL_TABLET | Freq: Two times a day (BID) | ORAL | Status: DC
  Administered 2018-05-04 – 2018-05-05 (×2): 5 mg via ORAL
  Filled 2018-05-04 (×2): qty 1

## 2018-05-04 MED ORDER — SODIUM CHLORIDE 0.9% FLUSH: 3.0000 mL | INTRAVENOUS | Status: DC | PRN

## 2018-05-04 MED ORDER — HEPARIN (PORCINE) IN NACL 2-0.9 UNITS/ML
INTRAMUSCULAR | Status: AC | PRN
  Administered 2018-05-04: 500 mL

## 2018-05-04 SURGICAL SUPPLY — 16 items
CATH MAPPNG PENTARAY F 2-6-2MM (CATHETERS) ×1 IMPLANT
CATH NAVISTAR SMARTTOUCH DF (ABLATOR) ×3 IMPLANT
CATH SOUNDSTAR 3D IMAGING (CATHETERS) ×3 IMPLANT
CATH WEBSTER BI DIR CS D-F CRV (CATHETERS) ×3 IMPLANT
COVER SWIFTLINK CONNECTOR (BAG) ×3 IMPLANT
NEEDLE BAYLIS TRANSSEPTAL 71CM (NEEDLE) ×3 IMPLANT
PACK EP LATEX FREE (CUSTOM PROCEDURE TRAY) ×2
PACK EP LF (CUSTOM PROCEDURE TRAY) ×1 IMPLANT
PAD DEFIB LIFELINK (PAD) ×3 IMPLANT
PATCH CARTO3 (PAD) ×3 IMPLANT
PENTARAY F 2-6-2MM (CATHETERS) ×3
SHEATH AVANTI 11F 11CM (SHEATH) ×3 IMPLANT
SHEATH PINNACLE 7F 10CM (SHEATH) ×6 IMPLANT
SHEATH PINNACLE 9F 10CM (SHEATH) ×3 IMPLANT
SHEATH SWARTZ TS SL2 63CM 8.5F (SHEATH) ×3 IMPLANT
TUBING SMART ABLATE COOLFLOW (TUBING) ×3 IMPLANT

## 2018-05-04 NOTE — Anesthesia Procedure Notes (Signed)
Procedure Name: Intubation Date/Time: 05/04/2018 11:15 AM Performed by: Jenne Campus, CRNA Pre-anesthesia Checklist: Patient identified, Emergency Drugs available, Suction available and Patient being monitored Patient Re-evaluated:Patient Re-evaluated prior to induction Oxygen Delivery Method: Circle System Utilized Preoxygenation: Pre-oxygenation with 100% oxygen Induction Type: IV induction Ventilation: Mask ventilation without difficulty and Oral airway inserted - appropriate to patient size Laryngoscope Size: Sabra Heck and 2 Grade View: Grade I Tube type: Oral Tube size: 7.5 mm Number of attempts: 1 Airway Equipment and Method: Stylet and Oral airway Placement Confirmation: ETT inserted through vocal cords under direct vision,  positive ETCO2 and breath sounds checked- equal and bilateral Secured at: 22 cm Tube secured with: Tape Dental Injury: Teeth and Oropharynx as per pre-operative assessment

## 2018-05-04 NOTE — Anesthesia Postprocedure Evaluation (Signed)
Anesthesia Post Note  Patient: TEKILA CAILLOUET  Procedure(s) Performed: ATRIAL FIBRILLATION ABLATION (N/A )     Patient location during evaluation: Cath Lab Anesthesia Type: General Level of consciousness: awake and alert, oriented and patient cooperative Pain management: pain level controlled Vital Signs Assessment: post-procedure vital signs reviewed and stable Respiratory status: spontaneous breathing, nonlabored ventilation, respiratory function stable and patient connected to nasal cannula oxygen Cardiovascular status: blood pressure returned to baseline and stable Postop Assessment: no apparent nausea or vomiting Anesthetic complications: no    Last Vitals:  Vitals:   05/04/18 1705 05/04/18 1727  BP: (!) 127/59 129/80  Pulse: 83   Resp: 19   Temp:  36.8 C  SpO2: 95% 95%    Last Pain:  Vitals:   05/04/18 1727  TempSrc: Oral  PainSc:                  Samarah Hogle,E. Callista Hoh

## 2018-05-04 NOTE — Discharge Instructions (Signed)
Post procedure care instructions No driving for 4 days. No lifting over 5 lbs for 1 week. No vigorous or sexual activity for 1 week. You may return to work on 05/11/18. Keep procedure site clean & dry. If you notice increased pain, swelling, bleeding or pus, call/return!  You may shower, but no soaking baths/hot tubs/pools for 1 week.    You have an appointment set up with the [facility].  Multiple studies have shown that being followed by a dedicated atrial fibrillation clinic in addition to the standard care you receive from your other physicians improves health. We believe that enrollment in the atrial fibrillation clinic will allow [place] to better care for you.   The phone number to the [facility] is [phone]. The clinic is staffed Monday through Friday from 8:30am to 5pm.  Parking Directions: The clinic is located in the Heart and Vascular Building connected to [facility]. 1)From [address] turn on to [address] and go to the 3rd entrance  (Heart and Vascular entrance) on the right. 2)Look to the right for Heart &Vascular Parking Garage. 3)A code for the entrance is required please call the clinic to receive this.   4)Take the elevators to the 1st floor. Registration is in the room with the glass walls at the end of the hallway.  If you have any trouble parking or locating the clinic, please dont hesitate to call [phone].

## 2018-05-04 NOTE — Anesthesia Preprocedure Evaluation (Addendum)
Anesthesia Evaluation  Patient identified by MRN, date of birth, ID band Patient awake    Reviewed: Allergy & Precautions, NPO status , Patient's Chart, lab work & pertinent test results, reviewed documented beta blocker date and time   History of Anesthesia Complications Negative for: history of anesthetic complications  Airway Mallampati: I  TM Distance: >3 FB Neck ROM: Full    Dental  (+) Edentulous Upper, Edentulous Lower, Dental Advisory Given   Pulmonary COPD,  COPD inhaler, former smoker,    breath sounds clear to auscultation       Cardiovascular hypertension, Pt. on medications and Pt. on home beta blockers (-) angina+ dysrhythmias Atrial Fibrillation  Rhythm:Irregular Rate:Normal  05/03/18 ECHO: normal LVF, mod-severe MR   Neuro/Psych negative neurological ROS     GI/Hepatic Neg liver ROS, GERD  Medicated and Controlled,  Endo/Other  Morbid obesity  Renal/GU negative Renal ROS     Musculoskeletal  (+) Arthritis ,   Abdominal (+) + obese,   Peds  Hematology eliquis   Anesthesia Other Findings   Reproductive/Obstetrics                           Anesthesia Physical Anesthesia Plan  ASA: III  Anesthesia Plan: General   Post-op Pain Management:    Induction: Intravenous  PONV Risk Score and Plan: 3 and Ondansetron, Dexamethasone and Treatment may vary due to age or medical condition  Airway Management Planned: Oral ETT  Additional Equipment:   Intra-op Plan:   Post-operative Plan: Extubation in OR  Informed Consent: I have reviewed the patients History and Physical, chart, labs and discussed the procedure including the risks, benefits and alternatives for the proposed anesthesia with the patient or authorized representative who has indicated his/her understanding and acceptance.     Plan Discussed with: CRNA and Surgeon  Anesthesia Plan Comments: (Plan routine monitors,  GETA)       Anesthesia Quick Evaluation

## 2018-05-04 NOTE — H&P (Signed)
Cardiologist:  Dr Bronson Ing Primary Electrophysiologist: Thompson Grayer, MD       CC: afib   History of Present Illness: [name] is a 67 y.o. female who presents today for electrophysiology study and ablation.    The patient has a long history of atrial fibrillation and atrial flutter.  She has failed medical therapy with amiodarone.  He has fatigue and decreased exercise tolerance with her afib.  She is unaware of triggers/ precipitants for her afib.  She has chronic lung disease.  SOB is stable.  Today, she denies symptoms of  chest pain, orthopnea, PND, lower extremity edema, claudication, dizziness, presyncope, syncope, bleeding, or neurologic sequela. The patient is tolerating medications without difficulties and is otherwise without complaint today.        Past Medical History:  Diagnosis Date  . COPD (chronic obstructive pulmonary disease) ([place])   . GERD (gastroesophageal reflux disease)   . Hypercholesteremia   . Mild mitral regurgitation   . Mitral valve prolapse    a. dx 1985, not seen on echoes more recently.  . Obesity   . Paroxysmal atrial fibrillation ([facility])   . Paroxysmal atrial flutter ([place])    a. dx 05/2017.        Past Surgical History:  Procedure Laterality Date  . ABDOMINAL HYSTERECTOMY    . CARDIOVERSION N/A 10/08/2017   Procedure: CARDIOVERSION;  Surgeon: Herminio Commons, MD;  Location: [facility];  Service: Cardiovascular;  Laterality: N/A;  . CESAREAN SECTION    . TUBAL LIGATION             Current Outpatient Medications  Medication Sig Dispense Refill  . albuterol (PROAIR HFA) 108 (90 Base) MCG/ACT inhaler Inhale 2 puffs into the lungs every 6 (six) hours as needed for wheezing or shortness of breath. 1 Inhaler 11  . amiodarone (PACERONE) 200 MG tablet Take 1 tablet (200 mg total) by mouth daily. 200 mg twice a day for two weeks then 1 tablet by mouth daily 45 tablet 3  . Cholecalciferol (VITAMIN D3) 5000 UNITS  CAPS Take 5,000 Units by mouth daily.     Marland Kitchen CRANBERRY PO Take 4,200 mg by mouth daily.    Marland Kitchen doxycycline (VIBRA-TABS) 100 MG tablet Take 1 tablet (100 mg total) by mouth 2 (two) times daily. 20 tablet 1  . ELIQUIS 5 MG TABS tablet TAKE  (1)  TABLET TWICE A DAY. 60 tablet 3  . Fluticasone-Salmeterol (ADVAIR) 250-50 MCG/DOSE AEPB Inhale 1 puff into the lungs 2 (two) times daily.    Marland Kitchen guaiFENesin (MUCINEX) 600 MG 12 hr tablet Take 600 mg by mouth every morning.     Marland Kitchen HYDROcodone-homatropine (HYCODAN) 5-1.5 MG/5ML syrup Take 5 mLs by mouth every 6 (six) hours as needed for cough. 120 mL 0  . Icosapent Ethyl (VASCEPA) 1 g CAPS Take 2 g by mouth 2 (two) times daily. 120 capsule 5  . meloxicam (MOBIC) 7.5 MG tablet Take 1 tablet (7.5 mg total) by mouth daily. 90 tablet 3  . montelukast (SINGULAIR) 10 MG tablet Take 1 tablet (10 mg total) by mouth at bedtime. 90 tablet 3  . ranitidine (ZANTAC) 150 MG tablet Take 150 mg by mouth daily as needed for heartburn.    . tiotropium (SPIRIVA) 18 MCG inhalation capsule Place 1 capsule (18 mcg total) into inhaler and inhale daily. 30 capsule 11   No current facility-administered medications for this visit.     Allergies:   Penicillins   Social History:  The patient  reports that she quit smoking about 27 years ago. Her smoking use included cigarettes. She has a 20.00 pack-year smoking history. she has never used smokeless tobacco. She reports that she does not drink alcohol or use drugs.   Family History:  The patient's  family history includes Asthma in her mother; Atrial fibrillation in her brother; Emphysema in her father; Heart attack (age of onset: 68) in her mother; Hypertension in her son.    ROS:  Please see the history of present illness.   All other systems are personally reviewed and negative.    PHYSICAL EXAM: Vitals:   05/04/18 0840  BP: (!) 164/85  Pulse: 77  Resp: 18  Temp: 98 F (36.7 C)  SpO2: 98%   GEN: Well  nourished, well developed, in no acute distress  HEENT: normal  Neck: no JVD, carotid bruits, or masses Cardiac: iRRR; no murmurs, rubs, or gallops,no edema  Respiratory:  clear to auscultation bilaterally, normal work of breathing GI: soft, nontender, nondistended, + BS MS: no deformity or atrophy  Skin: warm and dry  Neuro:  Strength and sensation are intact Psych: euthymic mood, full affect      ASSESSMENT AND PLAN:  1.  Persistent atrial fibrillation/ atypical atrial flutter She has refractory symptomatic atrial arrhythmias.  She has failed medical therapy with amiodarone. Therapeutic strategies for afib/atrail flutter including medicine and ablation were discussed in detail with the patient today. Risk, benefits, and alternatives to EP study and radiofrequency ablation were also discussed in detail today. These risks include but are not limited to stroke, bleeding, vascular damage, tamponade, perforation, damage to the esophagus, lungs, and other structures, pulmonary vein stenosis, worsening renal function, and death. The patient understands these risk and wishes to proceed.   She reports compliance with eliquis without interruption.  TEE reviewed with Dr Harrington Challenger personally.  No LAA thrombus.  MR has become moderate to severe.  Thompson Grayer MD, [facility] 05/04/2018 10:46 AM

## 2018-05-04 NOTE — Transfer of Care (Signed)
Immediate Anesthesia Transfer of Care Note  Patient: [name]  Procedure(s) Performed: ATRIAL FIBRILLATION ABLATION (N/A )  Patient Location: PACU  Anesthesia Type:General  Level of Consciousness: awake, oriented and patient cooperative  Airway & Oxygen Therapy: Patient Spontanous Breathing and Patient connected to nasal cannula oxygen  Post-op Assessment: Report given to RN and Post -op Vital signs reviewed and stable  Post vital signs: Reviewed  Last Vitals:  Vitals Value Taken Time  BP 105/76 05/04/2018  2:21 PM  Temp 36.6 C 05/04/2018  2:22 PM  Pulse 77 05/04/2018  2:22 PM  Resp 18 05/04/2018  2:22 PM  SpO2 92 % 05/04/2018  2:22 PM  Vitals shown include unvalidated device data.  Last Pain:  Vitals:   05/04/18 1422  TempSrc:   PainSc: 0-No pain      Patients Stated Pain Goal: 2 (43/15/40 0867)  Complications: No apparent anesthesia complications

## 2018-05-04 NOTE — Progress Notes (Signed)
Site area: Right groin a venous sheaths were removed  Site Prior to Removal:  Level 0  Pressure Applied For 25 MINUTES    Bedrest Beginning at 1700p  Manual:   Yes.    Patient Status During Pull:  stable  Post Pull Groin Site:  Level 0  Post Pull Instructions Given:  Yes.    Post Pull Pulses Present:  Yes.    Dressing Applied:  Yes.    Comments:  VS remain stable

## 2018-05-05 ENCOUNTER — Encounter (HOSPITAL_COMMUNITY): Payer: Self-pay | Admitting: Internal Medicine

## 2018-05-05 DIAGNOSIS — J449 Chronic obstructive pulmonary disease, unspecified: Secondary | ICD-10-CM | POA: Diagnosis not present

## 2018-05-05 DIAGNOSIS — K219 Gastro-esophageal reflux disease without esophagitis: Secondary | ICD-10-CM | POA: Diagnosis not present

## 2018-05-05 DIAGNOSIS — I481 Persistent atrial fibrillation: Secondary | ICD-10-CM | POA: Diagnosis not present

## 2018-05-05 DIAGNOSIS — E669 Obesity, unspecified: Secondary | ICD-10-CM | POA: Diagnosis not present

## 2018-05-05 DIAGNOSIS — E7800 Pure hypercholesterolemia, unspecified: Secondary | ICD-10-CM | POA: Diagnosis not present

## 2018-05-05 DIAGNOSIS — Z6834 Body mass index (BMI) 34.0-34.9, adult: Secondary | ICD-10-CM | POA: Diagnosis not present

## 2018-05-05 DIAGNOSIS — I4892 Unspecified atrial flutter: Secondary | ICD-10-CM | POA: Diagnosis not present

## 2018-05-05 DIAGNOSIS — I10 Essential (primary) hypertension: Secondary | ICD-10-CM | POA: Diagnosis not present

## 2018-05-05 DIAGNOSIS — I341 Nonrheumatic mitral (valve) prolapse: Secondary | ICD-10-CM | POA: Diagnosis not present

## 2018-05-05 MED ORDER — PANTOPRAZOLE SODIUM 40 MG PO TBEC: 40.0000 mg | DELAYED_RELEASE_TABLET | Freq: Every day | ORAL | 0 refills | Status: DC

## 2018-05-05 NOTE — Plan of Care (Signed)
  Problem: Cardiac: Goal: Vascular access site(s) Level 0-1 will be maintained Outcome: Progressing   

## 2018-05-05 NOTE — Discharge Summary (Signed)
ELECTROPHYSIOLOGY PROCEDURE DISCHARGE SUMMARY    Patient ID: [name],  MRN: [id number], DOB/AGE: 04-19-1951 67 y.o.  Admit date: 05/04/2018 Discharge date: 05/05/2018  Primary Care Physician: Terald Sleeper, PA-C Primary Cardiologist/Electrophysiologist: Thompson Grayer, MD  Primary Discharge Diagnosis:  1. Persistent AFib 2. AFlutter     CHA2DS2Vasc is 2, on Eliquis  Secondary Discharge Diagnosis:  none  Procedures This Admission:  1.  Electrophysiology study and radiofrequency catheter ablation on 05/04/18 by Dr Thompson Grayer.  This study demonstrated  CONCLUSIONS: 1. Counter clockwise isthmus dependant right atrial flutter upon presentation.   2. Intracardiac echo reveals a moderate sized left atrium with four separate pulmonary veins without evidence of pulmonary vein stenosis. 3. Successful electrical isolation and anatomical encircling of all four pulmonary veins with radiofrequency current.    4. Cavo-tricuspid isthmus ablation was performed with complete bidirectional isthmus block achieved.  5. No inducible arrhythmias following ablation both on and off of Isuprel 6. No early apparent complications.   Brief HPI: [name] is a 67 y.o. female with a history of persistent atrial fibrillation and flutter.  They have failed medical therapy with amiodarone. Risks, benefits, and alternatives to catheter ablation of atrial fibrillation were reviewed with the patient who wished to proceed.  The patient underwent TEE prior to the procedure which demonstrated normal LV function and no LAA thrombus.    Hospital Course:  The patient was admitted and underwent EPS/RFCA of atrial fibrillation with details as outlined above.  They were monitored on telemetry overnight which demonstrated SR.  R groin was without complication on the day of discharge.  The patient feels well this morning, no CP or SOB, she was examined and considered to be stable for discharge.  Wound care and  restrictions were reviewed with the patient.  The patient  will be seen back by Roderic Palau, NP in 4 weeks and Dr Rayann Heman in 12 weeks for post ablation follow up.   Continue amiodarone for 4 weeks, likekly d/c at her AFib clinic f/u   Physical Exam: Vitals:   05/04/18 1953 05/05/18 0259 05/05/18 0300 05/05/18 0500  BP: 120/64 118/71  126/70  Pulse: 93   90  Resp: 18     Temp: 98.3 F (36.8 C)   97.8 F (36.6 C)  TempSrc: Oral   Oral  SpO2: 96% (!) 87%    Weight:   217 lb 8 oz (98.7 kg)   Height:        GEN- The patient is well appearing, alert and oriented x 3 today.   HEENT: normocephalic, atraumatic; sclera clear, conjunctiva pink; hearing intact; oropharynx clear; neck supple  Lungs- CTA b/l, normal work of breathing.  No wheezes, rales, rhonchi Heart- RRR, no murmurs, rubs or gallops  GI- soft, non-tender, non-distended Extremities- no clubbing, cyanosis, or edema; DP/PT/radial pulses 2+ bilaterally, R groin without hematoma/bruit MS- no significant deformity or atrophy Skin- warm and dry, no rash or lesion Psych- euthymic mood, full affect Neuro- strength and sensation are intact   Labs:   Lab Results  Component Value Date   WBC 5.7 05/04/2018   HGB 14.2 05/04/2018   HCT 44.6 05/04/2018   MCV 91.8 05/04/2018   PLT 278 05/04/2018    Recent Labs  Lab 05/04/18 0857  NA 141  K 4.4  CL 106  CO2 27  BUN 10  CREATININE 0.89  CALCIUM 9.0  GLUCOSE 99     Discharge Medications:  Allergies as of 05/05/2018  Reactions   Penicillins Rash   Has patient had a PCN reaction causing immediate rash, facial/tongue/throat swelling, SOB or lightheadedness with hypotension: Yes Has patient had a PCN reaction causing severe rash involving mucus membranes or skin necrosis: Yes Has patient had a PCN reaction that required hospitalization: No Has patient had a PCN reaction occurring within the last 10 years: No If all of the above answers are "NO", then may proceed with  Cephalosporin use.      Medication List    TAKE these medications   albuterol 108 (90 Base) MCG/ACT inhaler Commonly known as:  PROAIR HFA Inhale 2 puffs into the lungs every 6 (six) hours as needed for wheezing or shortness of breath.   amiodarone 200 MG tablet Commonly known as:  PACERONE Take 1 tablet (200 mg total) by mouth daily.   CRANBERRY PO Take 4,200 mg by mouth daily.   docusate sodium 100 MG capsule Commonly known as:  COLACE Take 100 mg by mouth daily as needed for mild constipation.   doxycycline 100 MG tablet Commonly known as:  VIBRA-TABS Take 1 tablet (100 mg total) by mouth 2 (two) times daily. 1 po bid   ELIQUIS 5 MG Tabs tablet Generic drug:  apixaban TAKE (1) TABLET TWICE A DAY.   Fluticasone-Umeclidin-Vilant 100-62.5-25 MCG/INH Aepb Commonly known as:  TRELEGY ELLIPTA Inhale 1 Units into the lungs daily. What changed:  how much to take   guaiFENesin 600 MG 12 hr tablet Commonly known as:  MUCINEX Take 600 mg by mouth every morning.   HYDROcodone-acetaminophen 5-325 MG tablet Commonly known as:  NORCO/VICODIN Take 1 tablet by mouth every 6 (six) hours as needed for severe pain. What changed:    how much to take  reasons to take this   metoprolol tartrate 50 MG tablet Commonly known as:  LOPRESSOR Take 0.5 tablets (25 mg total) by mouth 2 (two) times daily.   montelukast 10 MG tablet Commonly known as:  SINGULAIR Take 1 tablet (10 mg total) by mouth at bedtime. What changed:  when to take this   pantoprazole 40 MG tablet Commonly known as:  PROTONIX Take 1 tablet (40 mg total) by mouth daily.   ranitidine 150 MG tablet Commonly known as:  ZANTAC Take 150 mg by mouth daily as needed for heartburn. Notes to patient:  Resume once you have completed the Protonix (Pantoprazole)   VASCEPA 1 g Caps Generic drug:  Icosapent Ethyl TAKE (2) CAPSULES TWICE DAILY. What changed:  See the new instructions.   Vitamin D3 5000 units Caps Take  5,000 Units by mouth daily.       Disposition:  Home  Discharge Instructions    Diet - low sodium heart healthy   Complete by:  As directed    Increase activity slowly   Complete by:  As directed      Follow-up Information    [facility] [place] Follow up on 06/04/2018.   Specialty:  Cardiology Why:  10:00AM Contact information: [address] [id number] [place] [phone]       Thompson Grayer, MD Follow up on 08/04/2018.   Specialty:  Cardiology Why:  10:45AM  Contact information: [place] [address] [phone]           Duration of Discharge Encounter: Greater than 30 minutes including physician time.  Venetia Night, PA-C 05/05/2018 9:57 AM

## 2018-05-05 NOTE — Progress Notes (Signed)
Pt discharge in stable condition via volunteer services into the care of her family via private vehicle.  PIV removed intact w/o S&S of complications.  Discharge instructions reviewed with pt/family.  Pt/family verbalized understanding.

## 2018-05-05 NOTE — Progress Notes (Signed)
Doing well s/p afib ablation.  VSS No concerns  DC to home today with routine wound care and follow-up. Add protonix x 6 weeks.  Thompson Grayer MD, [facility] 05/05/2018 8:34 AM

## 2018-05-05 NOTE — Plan of Care (Signed)
Adequate for discharge.

## 2018-05-24 ENCOUNTER — Other Ambulatory Visit: Payer: Self-pay | Admitting: Physician Assistant

## 2018-06-01 ENCOUNTER — Ambulatory Visit (HOSPITAL_COMMUNITY)
Admission: RE | Admit: 2018-06-01 | Discharge: 2018-06-01 | Disposition: A | Discharge status: Home / Self Care | Payer: Medicare HMO | Source: Ambulatory Visit | Attending: Nurse Practitioner | Admitting: Nurse Practitioner

## 2018-06-01 ENCOUNTER — Encounter (HOSPITAL_COMMUNITY): Payer: Self-pay | Admitting: Nurse Practitioner

## 2018-06-01 ENCOUNTER — Ambulatory Visit: Payer: Medicare HMO | Admitting: *Deleted

## 2018-06-01 VITALS — BP 116/74 | HR 75 | Ht 66.5 in | Wt 215.0 lb

## 2018-06-01 DIAGNOSIS — Z87891 Personal history of nicotine dependence: Secondary | ICD-10-CM | POA: Diagnosis not present

## 2018-06-01 DIAGNOSIS — E669 Obesity, unspecified: Secondary | ICD-10-CM | POA: Diagnosis not present

## 2018-06-01 DIAGNOSIS — Z8249 Family history of ischemic heart disease and other diseases of the circulatory system: Secondary | ICD-10-CM | POA: Diagnosis not present

## 2018-06-01 DIAGNOSIS — I4892 Unspecified atrial flutter: Secondary | ICD-10-CM | POA: Diagnosis not present

## 2018-06-01 DIAGNOSIS — Z7951 Long term (current) use of inhaled steroids: Secondary | ICD-10-CM | POA: Diagnosis not present

## 2018-06-01 DIAGNOSIS — Z7901 Long term (current) use of anticoagulants: Secondary | ICD-10-CM | POA: Insufficient documentation

## 2018-06-01 DIAGNOSIS — K219 Gastro-esophageal reflux disease without esophagitis: Secondary | ICD-10-CM | POA: Insufficient documentation

## 2018-06-01 DIAGNOSIS — J449 Chronic obstructive pulmonary disease, unspecified: Secondary | ICD-10-CM | POA: Diagnosis not present

## 2018-06-01 DIAGNOSIS — E7800 Pure hypercholesterolemia, unspecified: Secondary | ICD-10-CM | POA: Insufficient documentation

## 2018-06-01 DIAGNOSIS — I481 Persistent atrial fibrillation: Secondary | ICD-10-CM | POA: Insufficient documentation

## 2018-06-01 DIAGNOSIS — I341 Nonrheumatic mitral (valve) prolapse: Secondary | ICD-10-CM | POA: Insufficient documentation

## 2018-06-01 DIAGNOSIS — I4819 Other persistent atrial fibrillation: Secondary | ICD-10-CM

## 2018-06-01 DIAGNOSIS — Z79899 Other long term (current) drug therapy: Secondary | ICD-10-CM | POA: Diagnosis not present

## 2018-06-01 DIAGNOSIS — I340 Nonrheumatic mitral (valve) insufficiency: Secondary | ICD-10-CM | POA: Diagnosis not present

## 2018-06-01 NOTE — Progress Notes (Signed)
Primary Care Physician: Terald Sleeper, PA-C Referring Physician:Dr. Rayann Heman   [name] is a 67 y.o. female with a h/o afib/flutter that was seen by Dr. Rayann Heman in December after failing amiodarone. She was scheduled for ablation the end of January but developed pneumonia. After that cleared, she developed bronchitis. After that cleared, she developed the shingles. She is now in the afib clinic for f/u. She remains in atrial flutter at 63 bpm. Remains on amiodarone. She does want to go ahead and schedule ablation.    F/u ablation 7/30, performed 7/2. She has had a few episodes of afib usually lasts less than 1-2 hours. Continues on amiodarone. No swallowing or groin issues.  Today, she denies symptoms of palpitations, chest pain, shortness of breath, orthopnea, PND, lower extremity edema, dizziness, presyncope, syncope, or neurologic sequela. The patient is tolerating medications without difficulties and is otherwise without complaint today.   Past Medical History:  Diagnosis Date  . COPD (chronic obstructive pulmonary disease) ([place])   . GERD (gastroesophageal reflux disease)   . Hypercholesteremia   . Mild mitral regurgitation   . Mitral valve prolapse    a. dx 1985, not seen on echoes more recently.  . Obesity   . Paroxysmal atrial fibrillation ([facility])   . Paroxysmal atrial flutter ([place])    a. dx 05/2017.   Past Surgical History:  Procedure Laterality Date  . ABDOMINAL HYSTERECTOMY    . ATRIAL FIBRILLATION ABLATION N/A 05/04/2018   Procedure: ATRIAL FIBRILLATION ABLATION;  Surgeon: Thompson Grayer, MD;  Location: [facility];  Service: Cardiovascular;  Laterality: N/A;  . CARDIOVERSION N/A 10/08/2017   Procedure: CARDIOVERSION;  Surgeon: Herminio Commons, MD;  [facility];  Service: Cardiovascular;  Laterality: N/A;  . CESAREAN SECTION    . TEE WITHOUT CARDIOVERSION N/A 05/03/2018   Procedure: TRANSESOPHAGEAL ECHOCARDIOGRAM (TEE);  Surgeon: Fay Records, MD;   Location: [facility] ENDOSCOPY;  Service: Cardiovascular;  Laterality: N/A;  . TUBAL LIGATION      Current Outpatient Medications  Medication Sig Dispense Refill  . albuterol (PROAIR HFA) 108 (90 Base) MCG/ACT inhaler Inhale 2 puffs into the lungs every 6 (six) hours as needed for wheezing or shortness of breath. 1 Inhaler 11  . amiodarone (PACERONE) 200 MG tablet Take 1 tablet (200 mg total) by mouth daily. 90 tablet 1  . Cholecalciferol (VITAMIN D3) 5000 UNITS CAPS Take 5,000 Units by mouth daily.     Marland Kitchen CRANBERRY PO Take 4,200 mg by mouth daily.    Marland Kitchen docusate sodium (COLACE) 100 MG capsule Take 100 mg by mouth daily as needed for mild constipation.    Marland Kitchen doxycycline (VIBRA-TABS) 100 MG tablet Take 1 tablet (100 mg total) by mouth 2 (two) times daily. 1 po bid 20 tablet 0  . ELIQUIS 5 MG TABS tablet TAKE (1) TABLET TWICE A DAY. 60 tablet 2  . Fluticasone-Umeclidin-Vilant (TRELEGY ELLIPTA) 100-62.5-25 MCG/INH AEPB Inhale 1 Units into the lungs daily. (Patient taking differently: Inhale 1 puff into the lungs daily. ) 60 each 11  . guaiFENesin (MUCINEX) 600 MG 12 hr tablet Take 600 mg by mouth every morning.     Marland Kitchen HYDROcodone-acetaminophen (NORCO/VICODIN) 5-325 MG tablet Take 1 tablet by mouth every 6 (six) hours as needed for severe pain. (Patient taking differently: Take 0.5 tablets by mouth every 6 (six) hours as needed for moderate pain or severe pain. ) 40 tablet 0  . metoprolol tartrate (LOPRESSOR) 50 MG tablet Take 0.5 tablets (25  mg total) by mouth 2 (two) times daily. 60 tablet 1  . montelukast (SINGULAIR) 10 MG tablet Take 1 tablet (10 mg total) by mouth at bedtime. 90 tablet 0  . pantoprazole (PROTONIX) 40 MG tablet Take 1 tablet (40 mg total) by mouth daily. 45 tablet 0  . VASCEPA 1 g CAPS TAKE (2) CAPSULES TWICE DAILY. (Patient taking differently: TAKE (1) CAPSULES TWICE DAILY.) 120 capsule 1  . ranitidine (ZANTAC) 150 MG tablet Take 150 mg by mouth daily as needed for heartburn.     No  current facility-administered medications for this encounter.     Allergies  Allergen Reactions  . Penicillins Rash    Has patient had a PCN reaction causing immediate rash, facial/tongue/throat swelling, SOB or lightheadedness with hypotension: Yes Has patient had a PCN reaction causing severe rash involving mucus membranes or skin necrosis: Yes Has patient had a PCN reaction that required hospitalization: No Has patient had a PCN reaction occurring within the last 10 years: No If all of the above answers are "NO", then may proceed with Cephalosporin use.     Social History   Socioeconomic History  . Marital status: Married    Spouse name: Not on file  . Number of children: 1  . Years of education: Not on file  . Highest education level: Not on file  Occupational History  . Occupation: Actuary  Social Needs  . Financial resource strain: Not on file  . Food insecurity:    Worry: Not on file    Inability: Not on file  . Transportation needs:    Medical: Not on file    Non-medical: Not on file  Tobacco Use  . Smoking status: Former Smoker    Packs/day: 1.00    Years: 20.00    Pack years: 20.00    Types: Cigarettes    Last attempt to quit: 08/14/1990    Years since quitting: 27.8  . Smokeless tobacco: Never Used  Substance and Sexual Activity  . Alcohol use: No    Alcohol/week: 0.0 oz  . Drug use: No  . Sexual activity: Not Currently  Lifestyle  . Physical activity:    Days per week: Not on file    Minutes per session: Not on file  . Stress: Not on file  Relationships  . Social connections:    Talks on phone: Not on file    Gets together: Not on file    Attends religious service: Not on file    Active member of club or organization: Not on file    Attends meetings of clubs or organizations: Not on file    Relationship status: Not on file  . Intimate partner violence:    Fear of current or ex partner: Not on file    Emotionally abused: Not on file     Physically abused: Not on file    Forced sexual activity: Not on file  Other Topics Concern  . Not on file  Social History Narrative  . Not on file    Family History  Problem Relation Age of Onset  . Heart attack Mother 3  . Asthma Mother   . Emphysema Father        smoked  . Atrial fibrillation Brother   . Hypertension Son     ROS- All systems are reviewed and negative except as per the HPI above  Physical Exam: Vitals:   06/01/18 1438  BP: 116/74  Pulse: 75  Weight: 215 lb (97.5 kg)  Height: 5' 6.5" (1.689 m)   Wt Readings from Last 3 Encounters:  06/01/18 215 lb (97.5 kg)  05/05/18 217 lb 8 oz (98.7 kg)  05/03/18 214 lb (97.1 kg)    Labs: Lab Results  Component Value Date   NA 141 05/04/2018   K 4.4 05/04/2018   CL 106 05/04/2018   CO2 27 05/04/2018   GLUCOSE 99 05/04/2018   BUN 10 05/04/2018   CREATININE 0.89 05/04/2018   CALCIUM 9.0 05/04/2018   Lab Results  Component Value Date   INR 1.09 05/10/2017   Lab Results  Component Value Date   CHOL 196 04/01/2017   HDL 45 04/01/2017   LDLCALC 119 (H) 04/01/2017   TRIG 161 (H) 04/01/2017     GEN- The patient is well appearing, alert and oriented x 3 today.   Head- normocephalic, atraumatic Eyes-  Sclera clear, conjunctiva pink Ears- hearing intact Oropharynx- clear Neck- supple, no JVP Lymph- no cervical lymphadenopathy Lungs- Clear to ausculation bilaterally, normal work of breathing Heart- irregular rate and rhythm, no murmurs, rubs or gallops, PMI not laterally displaced GI- soft, NT, ND, + BS Extremities- no clubbing, cyanosis, or edema MS- no significant deformity or atrophy Skin- no rash or lesion Psych- euthymic mood, full affect Neuro- strength and sensation are intact  EKG- NSR at 75 bpm, pr int 148 ms, qrs int 88 ms, qtc 453 bpm Epic records reviewed Echo-Study Conclusions  - Left ventricle: The cavity size was normal. Wall thickness was   normal. Systolic function was normal.  The estimated ejection   fraction was in the range of 60% to 65%. Wall motion was normal;   there were no regional wall motion abnormalities. Left   ventricular diastolic function parameters were normal. - Aortic valve: Trileaflet; mildly thickened leaflets. - Mitral valve: There was mild regurgitation.   Assessment and Plan: 1. Persistent afib/flutter S/P Ablation  Maintaining SR   She continues on amiodarone at 200 mg qd Continue eliquis 5 mg bid without missed doses  F/u with Dr. Rayann Heman 10/2  Geroge Baseman. Preslyn Warr, [facility] [address] [place], [place] [address] [phone]

## 2018-06-04 ENCOUNTER — Ambulatory Visit (HOSPITAL_COMMUNITY): Payer: Medicare HMO | Admitting: Nurse Practitioner

## 2018-06-14 ENCOUNTER — Other Ambulatory Visit: Payer: Self-pay

## 2018-06-14 ENCOUNTER — Ambulatory Visit (INDEPENDENT_AMBULATORY_CARE_PROVIDER_SITE_OTHER): Payer: Medicare HMO | Admitting: *Deleted

## 2018-06-14 ENCOUNTER — Encounter: Payer: Self-pay | Admitting: *Deleted

## 2018-06-14 ENCOUNTER — Ambulatory Visit (INDEPENDENT_AMBULATORY_CARE_PROVIDER_SITE_OTHER): Payer: Medicare HMO

## 2018-06-14 VITALS — BP 126/66 | HR 69 | Temp 97.0°F | Ht 66.0 in | Wt 214.0 lb

## 2018-06-14 DIAGNOSIS — Z0000 Encounter for general adult medical examination without abnormal findings: Secondary | ICD-10-CM

## 2018-06-14 DIAGNOSIS — Z13820 Encounter for screening for osteoporosis: Secondary | ICD-10-CM

## 2018-06-14 DIAGNOSIS — Z780 Asymptomatic menopausal state: Secondary | ICD-10-CM

## 2018-06-14 NOTE — Addendum Note (Signed)
Addended by: Torrie Mayers on: 06/14/2018 03:34 PM   Modules accepted: Level of Service, SmartSet

## 2018-06-14 NOTE — Progress Notes (Signed)
Subjective:   [name] is a 67 y.o. female who presents for Medicare Annual (Subsequent) preventive examination. [name] is married and lives with her husband and her dog [place]. She is retired from [employer] but works Part time at the [employer] She reports that she has had some health issues since her last AWV but is feeling and doing better. She has a history of Atrial Fibrillation and went into Atrial Flutter. She had an unsuccessful cardioversion. She developed pneumonia and then the shingles. She reports that she had a successful atrial abalation 05/04/18 by Dr. Rayann Heman. She states she is feeling much better since her procedure. [name] is active in her church and helps with VBS and Christmas plays.        Objective:     Vitals: BP 126/66 (BP Location: Left Arm, Patient Position: Sitting, Cuff Size: Large)   Pulse 69   Temp (!) 97 F (36.1 C) (Oral)   Ht 5\' 6"  (1.676 m)   Wt 214 lb (97.1 kg)   BMI 34.54 kg/m   Body mass index is 34.54 kg/m.  Advanced Directives 05/04/2018 05/03/2018 10/08/2017 10/02/2017 05/10/2017 08/16/2013 08/14/2013  Does Patient Have a Medical Advance Directive? No No No No No Patient would not like information Patient does not have advance directive  Would patient like information on creating a medical advance directive? No - Patient declined No - Patient declined No - Patient declined No - Patient declined No - Patient declined - -  Pre-existing out of facility DNR order (yellow form or pink MOST form) - - - - - - No    Tobacco Social History   Tobacco Use  Smoking Status Former Smoker  . Packs/day: 1.00  . Years: 20.00  . Pack years: 20.00  . Types: Cigarettes  . Last attempt to quit: 08/14/1990  . Years since quitting: 27.8  Smokeless Tobacco Never Used    Counseling given: No Clinical Intake: Pre-visit preparation completed: No  Pain : No/denies pain Pain Score: 0-No pain   Diabetes: No How often do you need to have someone help you  when you read instructions, pamphlets, or other written materials from your doctor or pharmacy?: 1 - Never What is the last grade level you completed in school?: 12  Interpreter Needed?: No   Past Medical History:  Diagnosis Date  . COPD (chronic obstructive pulmonary disease) ([place])   . GERD (gastroesophageal reflux disease)   . Hypercholesteremia   . Mild mitral regurgitation   . Mitral valve prolapse    a. dx 1985, not seen on echoes more recently.  . Obesity   . Paroxysmal atrial fibrillation ([facility])   . Paroxysmal atrial flutter ([place])    a. dx 05/2017.   Past Surgical History:  Procedure Laterality Date  . ABDOMINAL HYSTERECTOMY    . ATRIAL FIBRILLATION ABLATION N/A 05/04/2018   Procedure: ATRIAL FIBRILLATION ABLATION;  Surgeon: Thompson Grayer, MD;  Location: [facility];  Service: Cardiovascular;  Laterality: N/A;  . CARDIOVERSION N/A 10/08/2017   Procedure: CARDIOVERSION;  Surgeon: Herminio Commons, MD;  [facility];  Service: Cardiovascular;  Laterality: N/A;  . CESAREAN SECTION    . TEE WITHOUT CARDIOVERSION N/A 05/03/2018   Procedure: TRANSESOPHAGEAL ECHOCARDIOGRAM (TEE);  Surgeon: Fay Records, MD;  Location: [facility];  Service: Cardiovascular;  Laterality: N/A;  . TUBAL LIGATION     Family History  Problem Relation Age of Onset  . Heart attack Mother 29  .  Asthma Mother   . Emphysema Father        smoked  . Atrial fibrillation Brother   . Hypertension Son    Social History   Socioeconomic History  . Marital status: Married    Spouse name: Not on file  . Number of children: 1  . Years of education: Not on file  . Highest education level: 12th grade  Occupational History  . Occupation: Actuary  . Occupation: [employer]     Comment: Part time 2 days a week   Social Needs  . Financial resource strain: Not hard at all  . Food insecurity:    Worry: Never true    Inability: Never true  . Transportation needs:    Medical: No     Non-medical: No  Tobacco Use  . Smoking status: Former Smoker    Packs/day: 1.00    Years: 20.00    Pack years: 20.00    Types: Cigarettes    Last attempt to quit: 08/14/1990    Years since quitting: 27.8  . Smokeless tobacco: Never Used  Substance and Sexual Activity  . Alcohol use: No    Alcohol/week: 0.0 standard drinks  . Drug use: No  . Sexual activity: Not Currently  Lifestyle  . Physical activity:    Days per week: 0 days    Minutes per session: 0 min  . Stress: Not at all  Relationships  . Social connections:    Talks on phone: More than three times a week    Gets together: More than three times a week    Attends religious service: More than 4 times per year    Active member of club or organization: Yes    Attends meetings of clubs or organizations: More than 4 times per year    Relationship status: Married  Other Topics Concern  . Not on file  Social History Narrative  . Not on file    Outpatient Encounter Medications as of 06/14/2018  Medication Sig  . albuterol (PROAIR HFA) 108 (90 Base) MCG/ACT inhaler Inhale 2 puffs into the lungs every 6 (six) hours as needed for wheezing or shortness of breath.  Marland Kitchen amiodarone (PACERONE) 200 MG tablet Take 1 tablet (200 mg total) by mouth daily.  . Cholecalciferol (VITAMIN D3) 5000 UNITS CAPS Take 5,000 Units by mouth daily.   Marland Kitchen CRANBERRY PO Take 4,200 mg by mouth daily.  Marland Kitchen docusate sodium (COLACE) 100 MG capsule Take 100 mg by mouth daily as needed for mild constipation.  Marland Kitchen doxycycline (VIBRA-TABS) 100 MG tablet Take 1 tablet (100 mg total) by mouth 2 (two) times daily. 1 po bid  . ELIQUIS 5 MG TABS tablet TAKE (1) TABLET TWICE A DAY.  Marland Kitchen Fluticasone-Umeclidin-Vilant (TRELEGY ELLIPTA) 100-62.5-25 MCG/INH AEPB Inhale 1 Units into the lungs daily. (Patient taking differently: Inhale 1 puff into the lungs daily. )  . guaiFENesin (MUCINEX) 600 MG 12 hr tablet Take 600 mg by mouth every morning.   . metoprolol tartrate (LOPRESSOR)  50 MG tablet Take 0.5 tablets (25 mg total) by mouth 2 (two) times daily.  . montelukast (SINGULAIR) 10 MG tablet Take 1 tablet (10 mg total) by mouth at bedtime.  . pantoprazole (PROTONIX) 40 MG tablet Take 1 tablet (40 mg total) by mouth daily.  Marland Kitchen VASCEPA 1 g CAPS TAKE (2) CAPSULES TWICE DAILY. (Patient taking differently: TAKE (1) CAPSULES TWICE DAILY.)  . HYDROcodone-acetaminophen (NORCO/VICODIN) 5-325 MG tablet Take 1 tablet by mouth every 6 (six) hours as  needed for severe pain. (Patient not taking: Reported on 06/14/2018)  . ranitidine (ZANTAC) 150 MG tablet Take 150 mg by mouth daily as needed for heartburn.   No facility-administered encounter medications on file as of 06/14/2018.     Activities of Daily Living In your present state of health, do you have any difficulty performing the following activities: 06/14/2018 05/04/2018  Hearing? N N  Vision? N N  Difficulty concentrating or making decisions? N N  Walking or climbing stairs? N Y  Comment - -  Dressing or bathing? N N  Doing errands, shopping? N N  Some recent data might be hidden   Patient Care Team: Theodoro Clock as PCP - General (Physician Assistant) Tanda Rockers, MD as Consulting Physician (Pulmonary Disease) Herminio Commons, MD as Attending Physician (Cardiology)    Assessment:   This is a routine wellness examination for Rolanda    Fall Risk Fall Risk  06/14/2018 04/12/2018 04/09/2018 03/10/2018 02/15/2018  Falls in the past year? No No No No No   Is the patient's home free of loose throw rugs in walkways, pet beds, electrical cords, etc?   no      Grab bars in the bathroom? no      Handrails on the stairs?   yes      Adequate lighting?   yes  Depression Screen PHQ 2/9 Scores 06/14/2018 04/12/2018 04/09/2018 03/10/2018  PHQ - 2 Score 0 0 0 0     Cognitive Function MMSE - Mini Mental State Exam 06/14/2018 05/27/2017  Orientation to time 5 5  Orientation to Place 5 5  Registration 3 3  Attention/  Calculation 5 5  Recall 3 3  Language- name 2 objects 2 2  Language- repeat 1 1  Language- follow 3 step command 3 3  Language- read & follow direction 1 1  Write a sentence 1 1  Copy design 1 1  Total score 30 30        Immunization History  Administered Date(s) Administered  . Influenza Split 08/03/2013  . Influenza,inj,Quad PF,6+ Mos 08/26/2016, 08/12/2017  . Influenza-Unspecified 08/12/2017  . Pneumococcal Conjugate-13 11/04/2007    Qualifies for Shingles Vaccine? Yes but patient wants to wait she is in the doughnut hole  Screening Tests Health Maintenance  Topic Date Due  . Hepatitis C Screening  1951/02/27  . TETANUS/TDAP  12/11/1967  . DEXA SCAN  12/10/2013  . PNA vac Low Risk Adult (2 of 2 - PPSV23) 12/10/2013  . INFLUENZA VACCINE  06/03/2018  . MAMMOGRAM  09/23/2019  . COLONOSCOPY  10/02/2019    Cancer Screenings: Lung: Low Dose CT Chest recommended if Age 59-80 years, 30 pack-year currently smoking OR have quit w/in 15years. Patient does not qualify. Breast:  Up to date on Mammogram? Yes   Up to date of Bone Density/Dexa? No Colorectal: Due 09/2019  Additional Screenings Hepatitis C Screening: refuses     Plan:   Continue therapeautic life style changes with diet and exercise. Continue to check pulse and monitor as needed for irregular heartbeat.  Mammogram scheduled for 09/28/18 on the [place] bus [place] completed today. Patient wishes to hold off on the shingles vaccine due to fianancial reasons  Wants to wait on pneumovax  Has F/U  appointment with Particia Nearing [facility] 08/23/18 and will receive her flu vaccination at this time.   I have personally reviewed and noted the following in the patient's chart:   . Medical and social history . Use  of alcohol, tobacco or illicit drugs  . Current medications and supplements . Functional ability and status . Nutritional status . Physical activity . Advanced directives . List of other  physicians . Hospitalizations, surgeries, and ER visits in previous 12 months . Vitals . Screenings to include cognitive, depression, and falls . Referrals and appointments  In addition, I have reviewed and discussed with patient certain preventive protocols, quality metrics, and best practice recommendations. A written personalized care plan for preventive services as well as general preventive health recommendations were provided to patient.     Torrie Mayers, RN  06/14/2018

## 2018-06-14 NOTE — Patient Instructions (Signed)
Preventive Care 65 Years and Older, Female Preventive care refers to lifestyle choices and visits with your health care provider that can promote health and wellness. What does preventive care include?  A yearly physical exam. This is also called an annual well check.  Dental exams once or twice a year.  Routine eye exams. Ask your health care provider how often you should have your eyes checked.  Personal lifestyle choices, including: ? Daily care of your teeth and gums. ? Regular physical activity. ? Eating a healthy diet. ? Avoiding tobacco and drug use. ? Limiting alcohol use. ? Practicing safe sex. ? Taking low-dose aspirin every day. ? Taking vitamin and mineral supplements as recommended by your health care provider. What happens during an annual well check? The services and screenings done by your health care provider during your annual well check will depend on your age, overall health, lifestyle risk factors, and family history of disease. Counseling Your health care provider may ask you questions about your:  Alcohol use.  Tobacco use.  Drug use.  Emotional well-being.  Home and relationship well-being.  Sexual activity.  Eating habits.  History of falls.  Memory and ability to understand (cognition).  Work and work environment.  Reproductive health.  Screening You may have the following tests or measurements:  Height, weight, and BMI.  Blood pressure.  Lipid and cholesterol levels. These may be checked every 5 years, or more frequently if you are over 50 years old.  Skin check.  Lung cancer screening. You may have this screening every year starting at age 55 if you have a 30-pack-year history of smoking and currently smoke or have quit within the past 15 years.  Fecal occult blood test (FOBT) of the stool. You may have this test every year starting at age 50.  Flexible sigmoidoscopy or colonoscopy. You may have a sigmoidoscopy every 5 years or  a colonoscopy every 10 years starting at age 50.  Hepatitis C blood test.  Hepatitis B blood test.  Sexually transmitted disease (STD) testing.  Diabetes screening. This is done by checking your blood sugar (glucose) after you have not eaten for a while (fasting). You may have this done every 1-3 years.  Bone density scan. This is done to screen for osteoporosis. You may have this done starting at age 69.  Mammogram. This may be done every 1-2 years. Talk to your health care provider about how often you should have regular mammograms.  Talk with your health care provider about your test results, treatment options, and if necessary, the need for more tests. Vaccines Your health care provider may recommend certain vaccines, such as:  Influenza vaccine. This is recommended every year.  Tetanus, diphtheria, and acellular pertussis (Tdap, Td) vaccine. You may need a Td booster every 10 years.  Varicella vaccine. You may need this if you have not been vaccinated.  Zoster vaccine. You may need this after age 60.  Measles, mumps, and rubella (MMR) vaccine. You may need at least one dose of MMR if you were born in 1957 or later. You may also need a second dose.  Pneumococcal 13-valent conjugate (PCV13) vaccine. One dose is recommended after age 69.  Pneumococcal polysaccharide (PPSV23) vaccine. One dose is recommended after age 69.  Meningococcal vaccine. You may need this if you have certain conditions.  Hepatitis A vaccine. You may need this if you have certain conditions or if you travel or work in places where you may be exposed to hepatitis   A.  Hepatitis B vaccine. You may need this if you have certain conditions or if you travel or work in places where you may be exposed to hepatitis B.  Haemophilus influenzae type b (Hib) vaccine. You may need this if you have certain conditions.  Talk to your health care provider about which screenings and vaccines you need and how often you  need them. This information is not intended to replace advice given to you by your health care provider. Make sure you discuss any questions you have with your health care provider. Document Released: 11/16/2015 Document Revised: 07/09/2016 Document Reviewed: 08/21/2015 Elsevier Interactive Patient Education  2018 [employer]  

## 2018-06-15 DIAGNOSIS — Z780 Asymptomatic menopausal state: Secondary | ICD-10-CM | POA: Diagnosis not present

## 2018-06-28 ENCOUNTER — Other Ambulatory Visit: Payer: Self-pay | Admitting: Family Medicine

## 2018-07-21 ENCOUNTER — Other Ambulatory Visit: Payer: Self-pay | Admitting: Physician Assistant

## 2018-07-22 ENCOUNTER — Other Ambulatory Visit: Payer: Self-pay | Admitting: Cardiovascular Disease

## 2018-07-22 ENCOUNTER — Other Ambulatory Visit: Payer: Self-pay | Admitting: *Deleted

## 2018-07-22 ENCOUNTER — Other Ambulatory Visit: Payer: Self-pay | Admitting: Physician Assistant

## 2018-07-22 MED ORDER — APIXABAN 5 MG PO TABS: 5.0000 mg | ORAL_TABLET | Freq: Two times a day (BID) | ORAL | 0 refills | Status: DC

## 2018-07-27 ENCOUNTER — Encounter: Payer: Self-pay | Admitting: Internal Medicine

## 2018-08-04 ENCOUNTER — Ambulatory Visit: Payer: Medicare HMO | Admitting: Internal Medicine

## 2018-08-11 ENCOUNTER — Telehealth: Payer: Self-pay | Admitting: *Deleted

## 2018-08-11 ENCOUNTER — Ambulatory Visit: Payer: Medicare HMO | Admitting: Internal Medicine

## 2018-08-11 ENCOUNTER — Encounter: Payer: Self-pay | Admitting: Internal Medicine

## 2018-08-11 VITALS — BP 118/64 | HR 71 | Ht 66.5 in | Wt 213.8 lb

## 2018-08-11 DIAGNOSIS — R0683 Snoring: Secondary | ICD-10-CM

## 2018-08-11 DIAGNOSIS — I340 Nonrheumatic mitral (valve) insufficiency: Secondary | ICD-10-CM

## 2018-08-11 DIAGNOSIS — G4700 Insomnia, unspecified: Secondary | ICD-10-CM | POA: Diagnosis not present

## 2018-08-11 DIAGNOSIS — I4819 Other persistent atrial fibrillation: Secondary | ICD-10-CM

## 2018-08-11 DIAGNOSIS — R400 Somnolence: Secondary | ICD-10-CM

## 2018-08-11 MED ORDER — AMIODARONE HCL 200 MG PO TABS: 100.0000 mg | ORAL_TABLET | Freq: Every day | ORAL | 0 refills | Status: DC

## 2018-08-11 NOTE — Patient Instructions (Addendum)
Medication Instructions:  Your physician has recommended you make the following change in your medication:   DECREASE amiodarone to 100 mg once a day  If you need a refill on your cardiac medications before your next appointment, please call your pharmacy.   Lab work: None Ordered  If you have labs (blood work) drawn today and your tests are completely normal, you will receive your results only by: Marland Kitchen MyChart Message (if you have MyChart) OR . A paper copy in the mail If you have any lab test that is abnormal or we need to change your treatment, we will call you to review the results.  Testing/Procedures: Your physician has requested that you have an echocardiogram prior to appointment with Dr. Rayann Heman. Echocardiography is a painless test that uses sound waves to create images of your heart. It provides your doctor with information about the size and shape of your heart and how well your heart's chambers and valves are working. This procedure takes approximately one hour. There are no restrictions for this procedure.  Your physician has recommended that you have a sleep study. This test records several body functions during sleep, including: brain activity, eye movement, oxygen and carbon dioxide blood levels, heart rate and rhythm, breathing rate and rhythm, the flow of air through your mouth and nose, snoring, body muscle movements, and chest and belly movement.  Follow-Up: . Your physician recommends that you schedule a follow-up appointment in: 3 months with Dr. Rayann Heman .   Any Other Special Instructions Will Be Listed Below (If Applicable).

## 2018-08-11 NOTE — Telephone Encounter (Signed)
-----   Message from Cleon Gustin, [place] sent at 08/11/2018  2:14 PM EDT ----- Regarding: Sleep Study Patient was seen by Dr. Rayann Heman today and needs Sleep Study for snoring, excessive daytime sleepiness, and insomnia (frequent night awakenings). Please pre-cert and schedule patient. Thanks

## 2018-08-11 NOTE — Telephone Encounter (Signed)
-----   Message from Cleon Gustin, [place] sent at 08/11/2018  2:14 PM EDT ----- Regarding: Sleep Study Patient was seen by Dr. Rayann Heman today and needs Sleep Study for snoring, excessive daytime sleepiness, and insomnia (frequent night awakenings). Please pre-cert and schedule patient. Thanks

## 2018-08-11 NOTE — Progress Notes (Signed)
PCP: Terald Sleeper, PA-C Primary Cardiologist: Dr Marinus Maw is a 67 y.o. female who presents today for routine electrophysiology followup.  Since his recent afib ablation, the patient reports doing very well.  She has rare palpitations which she think may represent ERAF.  she denies procedure related complications and is pleased with the results of the procedure.  Today, she denies symptoms of  chest pain, shortness of breath,  lower extremity edema, dizziness, presyncope, or syncope.  The patient is otherwise without complaint today.   Past Medical History:  Diagnosis Date  . COPD (chronic obstructive pulmonary disease) ([place])   . GERD (gastroesophageal reflux disease)   . Hypercholesteremia   . Mild mitral regurgitation   . Mitral valve prolapse    a. dx 1985, not seen on echoes more recently.  . Obesity   . Paroxysmal atrial fibrillation ([facility])   . Paroxysmal atrial flutter ([place])    a. dx 05/2017.   Past Surgical History:  Procedure Laterality Date  . ABDOMINAL HYSTERECTOMY    . ATRIAL FIBRILLATION ABLATION N/A 05/04/2018   Procedure: ATRIAL FIBRILLATION ABLATION;  Surgeon: Thompson Grayer, MD;  Location: [facility];  Service: Cardiovascular;  Laterality: N/A;  . CARDIOVERSION N/A 10/08/2017   Procedure: CARDIOVERSION;  Surgeon: Herminio Commons, MD;  Location: [facility];  Service: Cardiovascular;  Laterality: N/A;  . CESAREAN SECTION    . TEE WITHOUT CARDIOVERSION N/A 05/03/2018   Procedure: TRANSESOPHAGEAL ECHOCARDIOGRAM (TEE);  Surgeon: Fay Records, MD;  Location: [facility];  Service: Cardiovascular;  Laterality: N/A;  . TUBAL LIGATION      ROS- all systems are personally reviewed and negatives except as per HPI above  Current Outpatient Medications  Medication Sig Dispense Refill  . albuterol (PROAIR HFA) 108 (90 Base) MCG/ACT inhaler Inhale 2 puffs into the lungs every 6 (six) hours as needed for wheezing or shortness of breath. 1 Inhaler  11  . amiodarone (PACERONE) 200 MG tablet Take 1 tablet (200 mg total) by mouth daily. 90 tablet 0  . apixaban (ELIQUIS) 5 MG TABS tablet Take 1 tablet (5 mg total) by mouth 2 (two) times daily. 60 tablet 0  . Cholecalciferol (VITAMIN D3) 5000 UNITS CAPS Take 5,000 Units by mouth daily.     Marland Kitchen CRANBERRY PO Take 4,200 mg by mouth daily.    Marland Kitchen docusate sodium (COLACE) 100 MG capsule Take 100 mg by mouth daily as needed for mild constipation.    . Fluticasone-Umeclidin-Vilant (TRELEGY ELLIPTA) 100-62.5-25 MCG/INH AEPB Inhale 1 Units into the lungs daily. (Patient taking differently: Inhale 1 puff into the lungs daily. ) 60 each 11  . guaiFENesin (MUCINEX) 600 MG 12 hr tablet Take 600 mg by mouth every morning.     Vanessa Kick Ethyl (VASCEPA) 1 g CAPS Take 2 capsules (2 g total) by mouth 2 (two) times daily. (Needs to be seen) 120 capsule 0  . metoprolol tartrate (LOPRESSOR) 50 MG tablet Take 0.5 tablets (25 mg total) by mouth 2 (two) times daily. 60 tablet 1  . montelukast (SINGULAIR) 10 MG tablet Take 1 tablet (10 mg total) by mouth at bedtime. 90 tablet 0  . ranitidine (ZANTAC) 150 MG tablet Take 150 mg by mouth daily as needed for heartburn.     No current facility-administered medications for this visit.     Physical Exam: Vitals:   08/11/18 1227  BP: 118/64  Pulse: 71  SpO2: 95%  Weight: 213 lb 12.8 oz (97  kg)  Height: 5' 6.5" (1.689 m)    GEN- The patient is well appearing, alert and oriented x 3 today.   Head- normocephalic, atraumatic Eyes-  Sclera clear, conjunctiva pink Ears- hearing intact Oropharynx- clear Lungs- Clear to ausculation bilaterally, normal work of breathing Heart- Regular rate and rhythm, no murmurs, rubs or gallops, PMI not laterally displaced GI- soft, NT, ND, + BS Extremities- no clubbing, cyanosis, or edema  EKG tracing ordered today is personally reviewed and shows sinus rhythm with PACs, incomplete RBBB  Assessment and Plan:  1. Persistent atrial  fibrillation and atrial flutter Doing well s/p ablation chads2vasc score is 2 (age, female) She wishes to continue anticoagulation for now Reduce amiodarone 100mg  daily.  Consider stopping amiodarone upon return.  If she is still having palpitations, I would consider ILR for long term monitoring post ablation at that time.  lifestyle modification encouraged  2. Morbid obesity Body mass index is 33.99 kg/m. Wt Readings from Last 3 Encounters:  08/11/18 213 lb 12.8 oz (97 kg)  06/14/18 214 lb (97.1 kg)  06/01/18 215 lb (97.5 kg)   We discussed lifestyle modification again today.  3. Moderate to severe MR Possibly functional due to AF with RVR previously Repeat echo upon return   4. Snoring, frequent night time wakings Sleep study ordered  Return to see me in 3 months  Thompson Grayer MD, [facility] 08/11/2018 12:50 PM

## 2018-08-11 NOTE — Telephone Encounter (Signed)
PA submitted to [facility] via web portal. Clinicals faxed to [phone].

## 2018-08-16 ENCOUNTER — Telehealth: Payer: Self-pay | Admitting: *Deleted

## 2018-08-16 ENCOUNTER — Other Ambulatory Visit: Payer: Self-pay | Admitting: Physician Assistant

## 2018-08-16 NOTE — Telephone Encounter (Signed)
Staff message sent to [facility] authorization received. Ok to schedule sleep study. Auth # [id number] Valid dates of service 08/31/18 to 09/29/18.

## 2018-08-16 NOTE — Telephone Encounter (Signed)
-----   Message from Cleon Gustin, [place] sent at 08/11/2018  2:14 PM EDT ----- Regarding: Sleep Study Patient was seen by Dr. Rayann Heman today and needs Sleep Study for snoring, excessive daytime sleepiness, and insomnia (frequent night awakenings). Please pre-cert and schedule patient. Thanks

## 2018-08-16 NOTE — Telephone Encounter (Signed)
Patient is scheduled for lab study on 09/22/18. Patient understands her sleep study will be done at [facility] sleep lab. Patient understands she will receive a sleep packet in a week or so. Patient understands to call if she does not receive the sleep packet in a timely manner.  Left detailed message on voicemail with date and time of her test and informed patient to call back to confirm or reschedule.

## 2018-08-16 NOTE — Telephone Encounter (Signed)
-----   Message from Lauralee Evener, [place] sent at 08/16/2018  8:11 AM EDT ----- Regarding: RE: Sleep Study [employer] Auth received. Ok to schedule sleep study. Auth # [id number] Valid dates 08/30/18 to 09/29/18. ----- Message ----- From: Drue Novel I, RN Sent: 08/11/2018   2:14 PM EDT To: Freada Bergeron, CMA, Damian Leavell, RN, # Subject: Sleep Study                                    Patient was seen by Dr. Rayann Heman today and needs Sleep Study for snoring, excessive daytime sleepiness, and insomnia (frequent night awakenings). Please pre-cert and schedule patient. Thanks

## 2018-08-23 ENCOUNTER — Ambulatory Visit (INDEPENDENT_AMBULATORY_CARE_PROVIDER_SITE_OTHER): Payer: Medicare HMO | Admitting: Physician Assistant

## 2018-08-23 ENCOUNTER — Encounter: Payer: Self-pay | Admitting: Physician Assistant

## 2018-08-23 VITALS — BP 126/67 | HR 64 | Temp 97.0°F | Ht 66.5 in | Wt 217.6 lb

## 2018-08-23 DIAGNOSIS — I4891 Unspecified atrial fibrillation: Secondary | ICD-10-CM | POA: Diagnosis not present

## 2018-08-23 DIAGNOSIS — Z0000 Encounter for general adult medical examination without abnormal findings: Secondary | ICD-10-CM | POA: Diagnosis not present

## 2018-08-23 DIAGNOSIS — Z136 Encounter for screening for cardiovascular disorders: Secondary | ICD-10-CM | POA: Diagnosis not present

## 2018-08-23 DIAGNOSIS — M5417 Radiculopathy, lumbosacral region: Secondary | ICD-10-CM

## 2018-08-23 DIAGNOSIS — R109 Unspecified abdominal pain: Secondary | ICD-10-CM | POA: Diagnosis not present

## 2018-08-23 DIAGNOSIS — J449 Chronic obstructive pulmonary disease, unspecified: Secondary | ICD-10-CM

## 2018-08-23 LAB — MICROSCOPIC EXAMINATION: RENAL EPITHEL UA: NONE SEEN /HPF

## 2018-08-23 LAB — URINALYSIS, COMPLETE
Bilirubin, UA: NEGATIVE
Glucose, UA: NEGATIVE
Ketones, UA: NEGATIVE
Nitrite, UA: NEGATIVE
Protein, UA: NEGATIVE
Specific Gravity, UA: 1.015 (ref 1.005–1.030)
Urobilinogen, Ur: 0.2 mg/dL (ref 0.2–1.0)
pH, UA: 7 (ref 5.0–7.5)

## 2018-08-23 MED ORDER — FLUTICASONE-UMECLIDIN-VILANT 100-62.5-25 MCG/INH IN AEPB: 1.0000 [IU] | INHALATION_SPRAY | Freq: Every day | RESPIRATORY_TRACT | 11 refills | Status: DC

## 2018-08-23 MED ORDER — MONTELUKAST SODIUM 10 MG PO TABS: ORAL_TABLET | ORAL | 3 refills | Status: DC

## 2018-08-23 MED ORDER — SULFAMETHOXAZOLE-TRIMETHOPRIM 800-160 MG PO TABS: 1.0000 | ORAL_TABLET | Freq: Two times a day (BID) | ORAL | 0 refills | Status: DC

## 2018-08-23 MED ORDER — ALBUTEROL SULFATE HFA 108 (90 BASE) MCG/ACT IN AERS: 2.0000 | INHALATION_SPRAY | Freq: Four times a day (QID) | RESPIRATORY_TRACT | 11 refills | Status: DC | PRN

## 2018-08-23 MED ORDER — ICOSAPENT ETHYL 1 G PO CAPS: 2.0000 g | ORAL_CAPSULE | Freq: Two times a day (BID) | ORAL | 3 refills | Status: DC

## 2018-08-23 MED ORDER — METOPROLOL TARTRATE 50 MG PO TABS: ORAL_TABLET | ORAL | 11 refills | Status: DC

## 2018-08-23 MED ORDER — DOXYCYCLINE HYCLATE 100 MG PO TABS: 100.0000 mg | ORAL_TABLET | Freq: Two times a day (BID) | ORAL | 0 refills | Status: DC

## 2018-08-23 NOTE — Progress Notes (Signed)
BP 126/67   Pulse 64   Temp (!) 97 F (36.1 C) (Oral)   Ht 5' 6.5" (1.689 m)   Wt 217 lb 9.6 oz (98.7 kg)   BMI 34.60 kg/m    Subjective:    Patient ID: [name], female    DOB: 1951/01/04, 67 y.o.   MRN: [id number]  HPI: [name] is a 67 y.o. female presenting on 08/23/2018 for COPD (4 month follow up ) This patient comes in for her routine follow-up on her chronic medical conditions.  They do include COPD, atrial fibrillation, lumbosacral radiculopathy, asthma.  All of her medications are reviewed.  We will bring several of them up-to-date with refills.  She is still seeing Dr. Rayann Heman for her atrial fibrillation and they handle her cardiac medications still at this time.  It is of note that she is still taking Eliquis.  This patient has had several days of dysuria, frequency and nocturia. There is also pain over the bladder in the suprapubic region, no back pain. Denies leakage or hematuria.  Denies fever or chills. No pain in flank area.    Past Medical History:  Diagnosis Date  . COPD (chronic obstructive pulmonary disease) ([place])   . GERD (gastroesophageal reflux disease)   . Hypercholesteremia   . Mild mitral regurgitation   . Mitral valve prolapse    a. dx 1985, not seen on echoes more recently.  . Obesity   . Paroxysmal atrial fibrillation ([facility])   . Paroxysmal atrial flutter ([place])    a. dx 05/2017.   Relevant past medical, surgical, family and social history reviewed and updated as indicated. Interim medical history since our last visit reviewed. Allergies and medications reviewed and updated. DATA REVIEWED: CHART IN EPIC  Family History reviewed for pertinent findings.  Review of Systems  Constitutional: Negative.   HENT: Negative.   Eyes: Negative.   Respiratory: Negative.   Gastrointestinal: Negative.   Genitourinary: Positive for difficulty urinating, dysuria and urgency. Negative for flank pain.  Musculoskeletal: Positive for arthralgias,  back pain, gait problem and myalgias.    Allergies as of 08/23/2018      Reactions   Penicillins Rash   Has patient had a PCN reaction causing immediate rash, facial/tongue/throat swelling, SOB or lightheadedness with hypotension: Yes Has patient had a PCN reaction causing severe rash involving mucus membranes or skin necrosis: Yes Has patient had a PCN reaction that required hospitalization: No Has patient had a PCN reaction occurring within the last 10 years: No If all of the above answers are "NO", then may proceed with Cephalosporin use.      Medication List        Accurate as of 08/23/18  9:35 AM. Always use your most recent med list.          albuterol 108 (90 Base) MCG/ACT inhaler Commonly known as:  PROVENTIL HFA;VENTOLIN HFA Inhale 2 puffs into the lungs every 6 (six) hours as needed for wheezing or shortness of breath.   amiodarone 200 MG tablet Commonly known as:  PACERONE Take 0.5 tablets (100 mg total) by mouth daily.   CRANBERRY PO Take 4,200 mg by mouth daily.   docusate sodium 100 MG capsule Commonly known as:  COLACE Take 100 mg by mouth daily as needed for mild constipation.   doxycycline 100 MG tablet Commonly known as:  VIBRA-TABS Take 1 tablet (100 mg total) by mouth 2 (two) times daily. 1 po bid   ELIQUIS 5 MG  Tabs tablet Generic drug:  apixaban TAKE (1) TABLET TWICE A DAY.   Fluticasone-Umeclidin-Vilant 100-62.5-25 MCG/INH Aepb Inhale 1 Units into the lungs daily.   guaiFENesin 600 MG 12 hr tablet Commonly known as:  MUCINEX Take 600 mg by mouth every morning.   Icosapent Ethyl 1 g Caps Take 2 capsules (2 g total) by mouth 2 (two) times daily. (Needs to be seen)   metoprolol tartrate 50 MG tablet Commonly known as:  LOPRESSOR TAKE (1/2) TABLET TWICE DAILY.   montelukast 10 MG tablet Commonly known as:  SINGULAIR Take 1 tablet (10 mg total) by mouth at bedtime.   ranitidine 150 MG tablet Commonly known as:  ZANTAC Take 150 mg by  mouth daily as needed for heartburn.   sulfamethoxazole-trimethoprim 800-160 MG tablet Commonly known as:  BACTRIM DS,SEPTRA DS Take 1 tablet by mouth 2 (two) times daily.   Vitamin D3 5000 units Caps Take 5,000 Units by mouth daily.          Objective:    BP 126/67   Pulse 64   Temp (!) 97 F (36.1 C) (Oral)   Ht 5' 6.5" (1.689 m)   Wt 217 lb 9.6 oz (98.7 kg)   BMI 34.60 kg/m   Allergies  Allergen Reactions  . Penicillins Rash    Has patient had a PCN reaction causing immediate rash, facial/tongue/throat swelling, SOB or lightheadedness with hypotension: Yes Has patient had a PCN reaction causing severe rash involving mucus membranes or skin necrosis: Yes Has patient had a PCN reaction that required hospitalization: No Has patient had a PCN reaction occurring within the last 10 years: No If all of the above answers are "NO", then may proceed with Cephalosporin use.     Wt Readings from Last 3 Encounters:  08/23/18 217 lb 9.6 oz (98.7 kg)  08/11/18 213 lb 12.8 oz (97 kg)  06/14/18 214 lb (97.1 kg)    Physical Exam  Constitutional: She is oriented to person, place, and time. She appears well-developed and well-nourished.  HENT:  Head: Normocephalic and atraumatic.  Eyes: Pupils are equal, round, and reactive to light. Conjunctivae are normal.  Cardiovascular: Normal rate, regular rhythm, normal heart sounds and intact distal pulses.  Pulmonary/Chest: Effort normal and breath sounds normal.  Abdominal: Soft. Bowel sounds are normal. She exhibits no distension and no mass. There is tenderness in the suprapubic area. There is no rebound, no guarding and no CVA tenderness.  Neurological: She is alert and oriented to person, place, and time. She has normal reflexes.  Skin: Skin is warm and dry. No rash noted.  Psychiatric: She has a normal mood and affect. Her behavior is normal. Judgment and thought content normal.    Results for orders placed or performed during the  hospital encounter of 22/29/79  Basic metabolic panel  Result Value Ref Range   Sodium 141 135 - 145 mmol/L   Potassium 4.4 3.5 - 5.1 mmol/L   Chloride 106 98 - 111 mmol/L   CO2 27 22 - 32 mmol/L   Glucose, Bld 99 70 - 99 mg/dL   BUN 10 8 - 23 mg/dL   Creatinine, Ser 0.89 0.44 - 1.00 mg/dL   Calcium 9.0 8.9 - 10.3 mg/dL   GFR calc non Af Amer NOT CALCULATED >60 mL/min   GFR calc Af Amer NOT CALCULATED >60 mL/min   Anion gap 8 5 - 15  CBC with Differential/Platelet  Result Value Ref Range   WBC 5.7 4.0 - 10.5 K/uL  RBC 4.86 3.87 - 5.11 MIL/uL   Hemoglobin 14.2 12.0 - 15.0 g/dL   HCT 44.6 36.0 - 46.0 %   MCV 91.8 78.0 - 100.0 fL   MCH 29.2 26.0 - 34.0 pg   MCHC 31.8 30.0 - 36.0 g/dL   RDW 13.4 11.5 - 15.5 %   Platelets 278 150 - 400 K/uL   Neutrophils Relative % 64 %   Neutro Abs 3.7 1.7 - 7.7 K/uL   Lymphocytes Relative 26 %   Lymphs Abs 1.5 0.7 - 4.0 K/uL   Monocytes Relative 6 %   Monocytes Absolute 0.4 0.1 - 1.0 K/uL   Eosinophils Relative 3 %   Eosinophils Absolute 0.2 0.0 - 0.7 K/uL   Basophils Relative 1 %   Basophils Absolute 0.0 0.0 - 0.1 K/uL   Immature Granulocytes 0 %   Abs Immature Granulocytes 0.0 0.0 - 0.1 K/uL  POCT Activated clotting time  Result Value Ref Range   Activated Clotting Time 246 seconds  POCT Activated clotting time  Result Value Ref Range   Activated Clotting Time 285 seconds  POCT Activated clotting time  Result Value Ref Range   Activated Clotting Time 323 seconds  POCT Activated clotting time  Result Value Ref Range   Activated Clotting Time 213 seconds  POCT Activated clotting time  Result Value Ref Range   Activated Clotting Time 202 seconds  POCT Activated clotting time  Result Value Ref Range   Activated Clotting Time 191 seconds      Assessment & Plan:   1. Side pain - Urine Culture - Urinalysis, Complete  2. COPD  GOLD III - Fluticasone-Umeclidin-Vilant (TRELEGY ELLIPTA) 100-62.5-25 MCG/INH AEPB; Inhale 1 Units  into the lungs daily.  Dispense: 60 each; Refill: 11 - albuterol (PROAIR HFA) 108 (90 Base) MCG/ACT inhaler; Inhale 2 puffs into the lungs every 6 (six) hours as needed for wheezing or shortness of breath.  Dispense: 1 Inhaler; Refill: 11  3. Atrial fibrillation, unspecified type [facility]) Continue cardiology  4. Lumbosacral radiculopathy Continue stretching  5. Well adult exam - CBC with Differential/Platelet - CMP14+EGFR - Lipid panel - TSH   Continue all other maintenance medications as listed above.  Follow up plan: No follow-ups on file.  Educational handout given for [place] PA-C [place] [address]  [place], [place] [address] [phone]   08/23/2018, 9:35 AM

## 2018-08-24 LAB — CBC WITH DIFFERENTIAL/PLATELET
BASOS ABS: 0 10*3/uL (ref 0.0–0.2)
BASOS: 1 %
EOS (ABSOLUTE): 0.1 10*3/uL (ref 0.0–0.4)
Eos: 2 %
Hematocrit: 38.7 % (ref 34.0–46.6)
Hemoglobin: 12.8 g/dL (ref 11.1–15.9)
Immature Grans (Abs): 0 10*3/uL (ref 0.0–0.1)
Immature Granulocytes: 0 %
LYMPHS ABS: 1.5 10*3/uL (ref 0.7–3.1)
LYMPHS: 26 %
MCH: 30 pg (ref 26.6–33.0)
MCHC: 33.1 g/dL (ref 31.5–35.7)
MCV: 91 fL (ref 79–97)
MONOS ABS: 0.5 10*3/uL (ref 0.1–0.9)
Monocytes: 8 %
NEUTROS ABS: 3.7 10*3/uL (ref 1.4–7.0)
Neutrophils: 63 %
PLATELETS: 315 10*3/uL (ref 150–450)
RBC: 4.26 x10E6/uL (ref 3.77–5.28)
RDW: 12.5 % (ref 12.3–15.4)
WBC: 5.9 10*3/uL (ref 3.4–10.8)

## 2018-08-24 LAB — CMP14+EGFR
A/G RATIO: 1.5 (ref 1.2–2.2)
ALK PHOS: 102 IU/L (ref 39–117)
ALT: 22 IU/L (ref 0–32)
AST: 21 IU/L (ref 0–40)
Albumin: 4 g/dL (ref 3.6–4.8)
BILIRUBIN TOTAL: 1.1 mg/dL (ref 0.0–1.2)
BUN / CREAT RATIO: 12 (ref 12–28)
BUN: 11 mg/dL (ref 8–27)
CHLORIDE: 101 mmol/L (ref 96–106)
CO2: 26 mmol/L (ref 20–29)
Calcium: 9.2 mg/dL (ref 8.7–10.3)
Creatinine, Ser: 0.95 mg/dL (ref 0.57–1.00)
GFR calc non Af Amer: 61 mL/min/{1.73_m2} (ref >59)
GFR, EST AFRICAN AMERICAN: 71 mL/min/{1.73_m2} (ref >59)
GLUCOSE: 97 mg/dL (ref 65–99)
Globulin, Total: 2.6 g/dL (ref 1.5–4.5)
Potassium: 4.7 mmol/L (ref 3.5–5.2)
Sodium: 140 mmol/L (ref 134–144)
Total Protein: 6.6 g/dL (ref 6.0–8.5)

## 2018-08-24 LAB — LIPID PANEL
CHOLESTEROL TOTAL: 229 mg/dL — AB (ref 100–199)
Chol/HDL Ratio: 5.1 ratio — ABNORMAL HIGH (ref 0.0–4.4)
HDL: 45 mg/dL (ref >39)
LDL Calculated: 156 mg/dL — ABNORMAL HIGH (ref 0–99)
Triglycerides: 138 mg/dL (ref 0–149)
VLDL CHOLESTEROL CAL: 28 mg/dL (ref 5–40)

## 2018-08-24 LAB — TSH: TSH: 2.54 u[IU]/mL (ref 0.450–4.500)

## 2018-08-24 LAB — URINE CULTURE

## 2018-09-10 ENCOUNTER — Other Ambulatory Visit: Payer: Self-pay | Admitting: Physician Assistant

## 2018-09-19 ENCOUNTER — Encounter: Payer: Self-pay | Admitting: Physician Assistant

## 2018-09-20 ENCOUNTER — Other Ambulatory Visit: Payer: Self-pay | Admitting: Physician Assistant

## 2018-09-20 MED ORDER — PANTOPRAZOLE SODIUM 20 MG PO TBEC: 20.0000 mg | DELAYED_RELEASE_TABLET | Freq: Every day | ORAL | 3 refills | Status: DC

## 2018-09-22 ENCOUNTER — Encounter (HOSPITAL_BASED_OUTPATIENT_CLINIC_OR_DEPARTMENT_OTHER): Payer: Medicare HMO

## 2018-09-28 ENCOUNTER — Emergency Department (HOSPITAL_COMMUNITY): Payer: Medicare HMO

## 2018-09-28 ENCOUNTER — Encounter (HOSPITAL_COMMUNITY): Payer: Self-pay | Admitting: Emergency Medicine

## 2018-09-28 ENCOUNTER — Emergency Department (HOSPITAL_COMMUNITY)
Admission: EM | Admit: 2018-09-28 | Discharge: 2018-09-28 | Disposition: A | Discharge status: Home / Self Care | Payer: Medicare HMO | Attending: Emergency Medicine | Admitting: Emergency Medicine

## 2018-09-28 ENCOUNTER — Other Ambulatory Visit: Payer: Self-pay

## 2018-09-28 DIAGNOSIS — R000 Tachycardia, unspecified: Secondary | ICD-10-CM | POA: Diagnosis not present

## 2018-09-28 DIAGNOSIS — Y939 Activity, unspecified: Secondary | ICD-10-CM | POA: Insufficient documentation

## 2018-09-28 DIAGNOSIS — Y999 Unspecified external cause status: Secondary | ICD-10-CM | POA: Insufficient documentation

## 2018-09-28 DIAGNOSIS — S20219A Contusion of unspecified front wall of thorax, initial encounter: Secondary | ICD-10-CM | POA: Diagnosis not present

## 2018-09-28 DIAGNOSIS — Z79899 Other long term (current) drug therapy: Secondary | ICD-10-CM | POA: Insufficient documentation

## 2018-09-28 DIAGNOSIS — J449 Chronic obstructive pulmonary disease, unspecified: Secondary | ICD-10-CM | POA: Insufficient documentation

## 2018-09-28 DIAGNOSIS — S299XXA Unspecified injury of thorax, initial encounter: Secondary | ICD-10-CM | POA: Diagnosis not present

## 2018-09-28 DIAGNOSIS — I10 Essential (primary) hypertension: Secondary | ICD-10-CM | POA: Diagnosis not present

## 2018-09-28 DIAGNOSIS — Y929 Unspecified place or not applicable: Secondary | ICD-10-CM | POA: Diagnosis not present

## 2018-09-28 DIAGNOSIS — S82142A Displaced bicondylar fracture of left tibia, initial encounter for closed fracture: Secondary | ICD-10-CM | POA: Diagnosis not present

## 2018-09-28 DIAGNOSIS — S82132A Displaced fracture of medial condyle of left tibia, initial encounter for closed fracture: Secondary | ICD-10-CM

## 2018-09-28 DIAGNOSIS — S82102A Unspecified fracture of upper end of left tibia, initial encounter for closed fracture: Secondary | ICD-10-CM | POA: Diagnosis not present

## 2018-09-28 DIAGNOSIS — M542 Cervicalgia: Secondary | ICD-10-CM | POA: Diagnosis not present

## 2018-09-28 DIAGNOSIS — S199XXA Unspecified injury of neck, initial encounter: Secondary | ICD-10-CM | POA: Diagnosis not present

## 2018-09-28 DIAGNOSIS — Z87891 Personal history of nicotine dependence: Secondary | ICD-10-CM | POA: Insufficient documentation

## 2018-09-28 DIAGNOSIS — R079 Chest pain, unspecified: Secondary | ICD-10-CM | POA: Diagnosis not present

## 2018-09-28 MED ORDER — HYDROCODONE-ACETAMINOPHEN 5-325 MG PO TABS
2.0000 | ORAL_TABLET | Freq: Once | ORAL | Status: AC
  Administered 2018-09-28: 2 via ORAL
  Filled 2018-09-28: qty 2

## 2018-09-28 MED ORDER — HYDROMORPHONE HCL 1 MG/ML IJ SOLN
1.0000 mg | Freq: Once | INTRAMUSCULAR | Status: DC
  Filled 2018-09-28: qty 1

## 2018-09-28 MED ORDER — HYDROCODONE-ACETAMINOPHEN 5-325 MG PO TABS: 1.0000 | ORAL_TABLET | ORAL | 0 refills | Status: DC | PRN

## 2018-09-28 NOTE — Discharge Instructions (Signed)
You have cracked a bone in your left knee called the tibia.  Knee immobilizer, ice, elevate, follow-up with orthopedics, pain medicine.
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
## 2018-09-28 NOTE — ED Notes (Signed)
Patient's brother requested pain meds for pt. Asked pt if she was requesting pain meds, she stated "No my brother was asking about the meds but they wouldn't hurt."

## 2018-09-28 NOTE — ED Notes (Signed)
ED Provider at bedside. 

## 2018-09-28 NOTE — ED Notes (Signed)
Patient's brother stated "Dilautin makes her heart rate go up". Patient refused medication.

## 2018-10-01 ENCOUNTER — Telehealth: Payer: Self-pay | Admitting: Physician Assistant
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
## 2018-10-02 ENCOUNTER — Ambulatory Visit (INDEPENDENT_AMBULATORY_CARE_PROVIDER_SITE_OTHER): Payer: Medicare HMO | Admitting: Nurse Practitioner

## 2018-10-02 ENCOUNTER — Encounter: Payer: Self-pay | Admitting: Nurse Practitioner

## 2018-10-02 VITALS — BP 136/67 | HR 71 | Temp 98.1°F | Ht 66.0 in | Wt 222.0 lb

## 2018-10-02 DIAGNOSIS — J441 Chronic obstructive pulmonary disease with (acute) exacerbation: Secondary | ICD-10-CM | POA: Diagnosis not present

## 2018-10-02 MED ORDER — DOXYCYCLINE HYCLATE 100 MG PO TABS: 100.0000 mg | ORAL_TABLET | Freq: Two times a day (BID) | ORAL | 0 refills | Status: DC

## 2018-10-02 NOTE — Patient Instructions (Signed)
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
## 2018-10-06 ENCOUNTER — Encounter: Payer: Self-pay | Admitting: Physician Assistant

## 2018-10-06 ENCOUNTER — Ambulatory Visit (INDEPENDENT_AMBULATORY_CARE_PROVIDER_SITE_OTHER): Payer: Medicare HMO | Admitting: Physician Assistant

## 2018-10-06 VITALS — BP 119/65 | HR 78 | Temp 97.1°F | Ht 66.0 in | Wt 219.4 lb

## 2018-10-06 DIAGNOSIS — S8001XA Contusion of right knee, initial encounter: Secondary | ICD-10-CM | POA: Diagnosis not present

## 2018-10-06 DIAGNOSIS — S8001XD Contusion of right knee, subsequent encounter: Secondary | ICD-10-CM

## 2018-10-06 DIAGNOSIS — S8002XA Contusion of left knee, initial encounter: Secondary | ICD-10-CM | POA: Diagnosis not present

## 2018-10-06 DIAGNOSIS — S299XXA Unspecified injury of thorax, initial encounter: Secondary | ICD-10-CM | POA: Diagnosis not present

## 2018-10-06 DIAGNOSIS — T148XXA Other injury of unspecified body region, initial encounter: Secondary | ICD-10-CM

## 2018-10-06 DIAGNOSIS — S3991XD Unspecified injury of abdomen, subsequent encounter: Secondary | ICD-10-CM

## 2018-10-06 DIAGNOSIS — S301XXA Contusion of abdominal wall, initial encounter: Secondary | ICD-10-CM | POA: Diagnosis not present

## 2018-10-06 DIAGNOSIS — S301XXD Contusion of abdominal wall, subsequent encounter: Secondary | ICD-10-CM

## 2018-10-06 DIAGNOSIS — S8990XD Unspecified injury of unspecified lower leg, subsequent encounter: Secondary | ICD-10-CM

## 2018-10-06 DIAGNOSIS — S8002XD Contusion of left knee, subsequent encounter: Secondary | ICD-10-CM

## 2018-10-06 DIAGNOSIS — S3991XA Unspecified injury of abdomen, initial encounter: Secondary | ICD-10-CM

## 2018-10-06 DIAGNOSIS — S8990XA Unspecified injury of unspecified lower leg, initial encounter: Secondary | ICD-10-CM

## 2018-10-06 DIAGNOSIS — S299XXD Unspecified injury of thorax, subsequent encounter: Secondary | ICD-10-CM

## 2018-10-06 MED ORDER — HYDROCODONE-ACETAMINOPHEN 5-325 MG PO TABS: 1.0000 | ORAL_TABLET | ORAL | 0 refills | Status: DC | PRN
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
## 2018-10-10 ENCOUNTER — Encounter (HOSPITAL_BASED_OUTPATIENT_CLINIC_OR_DEPARTMENT_OTHER): Payer: Medicare HMO

## 2018-10-11 ENCOUNTER — Encounter: Payer: Self-pay | Admitting: Physician Assistant

## 2018-10-12 ENCOUNTER — Other Ambulatory Visit: Payer: Self-pay | Admitting: Physician Assistant

## 2018-10-12 MED ORDER — HYDROCODONE-ACETAMINOPHEN 5-325 MG PO TABS: 1.0000 | ORAL_TABLET | ORAL | 0 refills | Status: DC | PRN

## 2018-10-18 IMAGING — DX DG CHEST 2V
2 series · 2 of 2 positions shown · non-contrast
Comparison: Chest x-ray dated 05/10/2017.

CLINICAL DATA: Bronchitis

EXAM:
CHEST  2 VIEW

[chest pa]
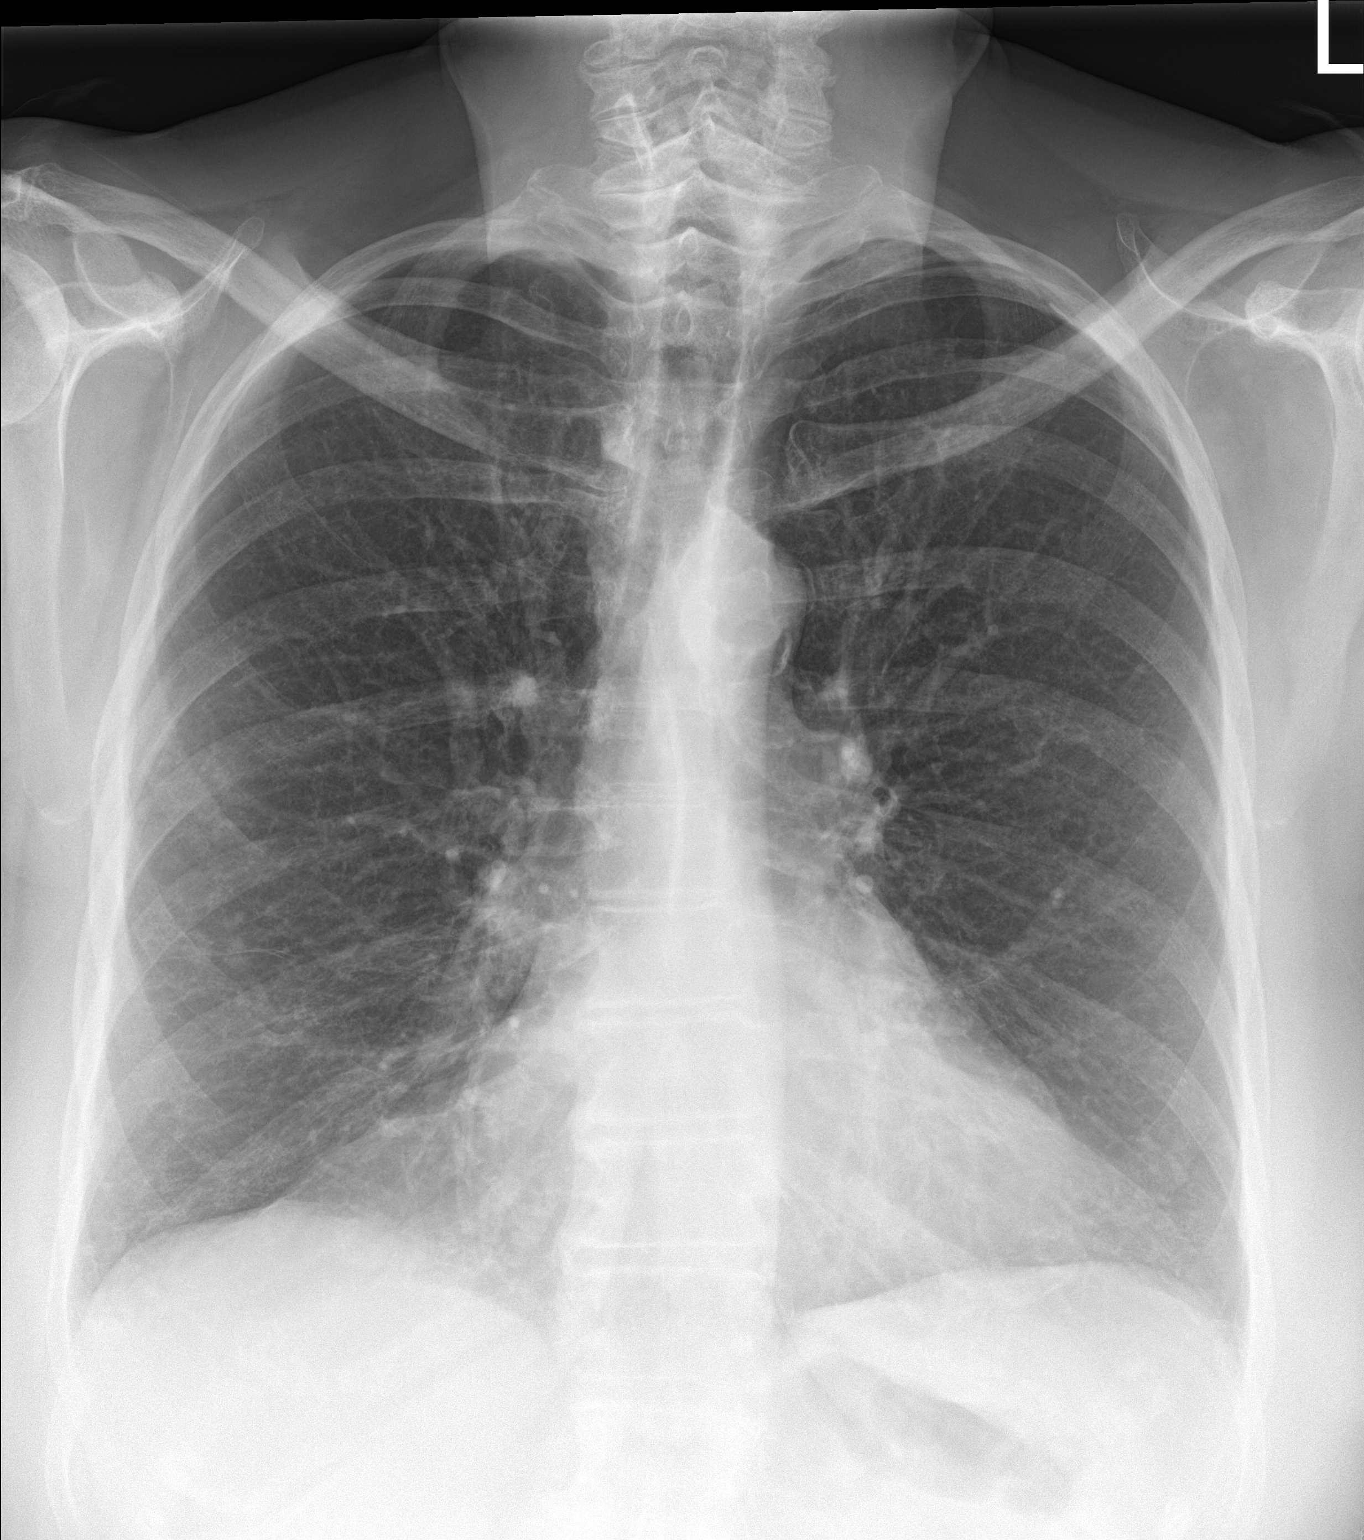

[chest lat]
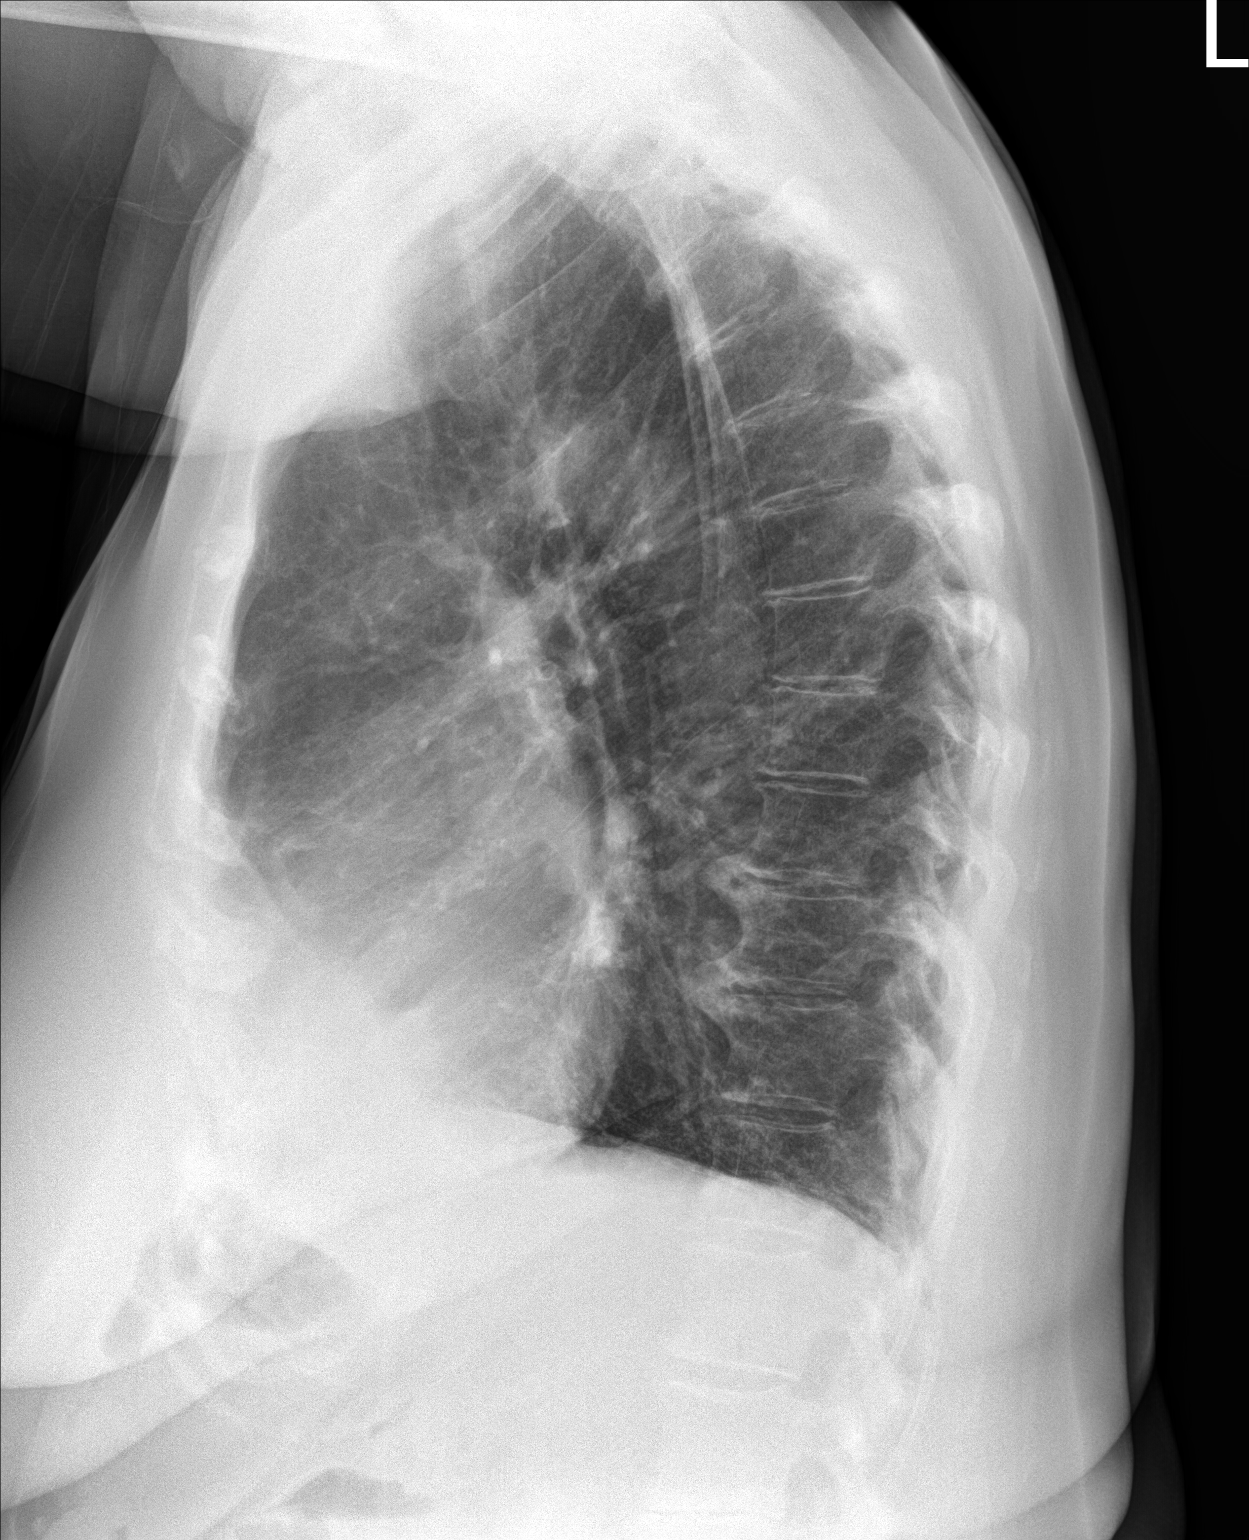

[2 of 2 positions shown; findings below may reference images not displayed]

FINDINGS: Heart size and mediastinal contours are stable. Lungs are
hyperexpanded indicating COPD. Chronic bronchitic changes noted
centrally. Subtle nodularity within the lateral aspects of the right
lung, mid to lower lung zone, seen on the AP view only. No confluent
opacity to suggest a developing pneumonia. No pleural effusion or
pneumothorax seen.

Mild degenerative spurring within the thoracic spine. No acute or
suspicious osseous finding.
IMPRESSION: 1. Hyperexpanded lungs consistent with COPD. Associated chronic
bronchitic changes.
2. Subtle nodularity within the lateral aspects of the right lung.
Pulmonary nodules were also described on earlier chest CT report of
08/26/2013. At that time, a follow-up chest CT in 4 weeks was
recommended to ensure benignity. As such, a chest CT is recommended
at this time.
3. No evidence of pneumonia or pulmonary edema.

These results will be called to the ordering clinician or
representative by the Radiologist Assistant, and communication
documented in the PACS or zVision Dashboard.

## 2018-10-20 ENCOUNTER — Ambulatory Visit (INDEPENDENT_AMBULATORY_CARE_PROVIDER_SITE_OTHER): Payer: Medicare HMO | Admitting: Physician Assistant

## 2018-10-20 ENCOUNTER — Encounter: Payer: Self-pay | Admitting: Physician Assistant

## 2018-10-20 VITALS — BP 114/67 | HR 70 | Temp 97.5°F | Ht 66.0 in | Wt 217.4 lb

## 2018-10-20 DIAGNOSIS — S82102D Unspecified fracture of upper end of left tibia, subsequent encounter for closed fracture with routine healing: Secondary | ICD-10-CM | POA: Diagnosis not present

## 2018-10-20 MED ORDER — METHOCARBAMOL 500 MG PO TABS: 500.0000 mg | ORAL_TABLET | Freq: Four times a day (QID) | ORAL | 0 refills | Status: DC | PRN
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
## 2018-10-23 ENCOUNTER — Encounter: Payer: Self-pay | Admitting: Physician Assistant

## 2018-10-25 ENCOUNTER — Other Ambulatory Visit: Payer: Self-pay | Admitting: Physician Assistant

## 2018-10-25 MED ORDER — HYDROCODONE-ACETAMINOPHEN 5-325 MG PO TABS: 1.0000 | ORAL_TABLET | ORAL | 0 refills | Status: DC | PRN

## 2018-10-28 ENCOUNTER — Ambulatory Visit (INDEPENDENT_AMBULATORY_CARE_PROVIDER_SITE_OTHER): Payer: Medicare HMO

## 2018-10-28 ENCOUNTER — Other Ambulatory Visit: Payer: Medicare HMO

## 2018-10-28 DIAGNOSIS — M7989 Other specified soft tissue disorders: Secondary | ICD-10-CM | POA: Diagnosis not present

## 2018-10-28 DIAGNOSIS — S8992XA Unspecified injury of left lower leg, initial encounter: Secondary | ICD-10-CM | POA: Diagnosis not present

## 2018-10-28 DIAGNOSIS — S82102D Unspecified fracture of upper end of left tibia, subsequent encounter for closed fracture with routine healing: Secondary | ICD-10-CM

## 2018-11-05 ENCOUNTER — Ambulatory Visit (HOSPITAL_COMMUNITY): Payer: Medicare HMO | Attending: Cardiology

## 2018-11-05 ENCOUNTER — Other Ambulatory Visit: Payer: Self-pay

## 2018-11-05 DIAGNOSIS — I340 Nonrheumatic mitral (valve) insufficiency: Secondary | ICD-10-CM | POA: Diagnosis not present

## 2018-11-08 ENCOUNTER — Other Ambulatory Visit: Payer: Self-pay | Admitting: Physician Assistant

## 2018-11-08 ENCOUNTER — Encounter: Payer: Self-pay | Admitting: Physician Assistant

## 2018-11-08 MED ORDER — DOXYCYCLINE HYCLATE 100 MG PO TABS: 100.0000 mg | ORAL_TABLET | Freq: Two times a day (BID) | ORAL | 0 refills | Status: DC

## 2018-11-10 ENCOUNTER — Ambulatory Visit: Payer: Medicare HMO | Admitting: Internal Medicine

## 2018-11-10 ENCOUNTER — Other Ambulatory Visit: Payer: Self-pay

## 2018-11-10 ENCOUNTER — Encounter: Payer: Self-pay | Admitting: Internal Medicine

## 2018-11-10 VITALS — BP 132/80 | HR 67 | Ht 66.5 in | Wt 218.0 lb

## 2018-11-10 DIAGNOSIS — I4819 Other persistent atrial fibrillation: Secondary | ICD-10-CM

## 2018-11-10 DIAGNOSIS — R0683 Snoring: Secondary | ICD-10-CM

## 2018-11-10 DIAGNOSIS — I340 Nonrheumatic mitral (valve) insufficiency: Secondary | ICD-10-CM

## 2018-11-10 MED ORDER — METOPROLOL TARTRATE 25 MG PO TABS: 12.5000 mg | ORAL_TABLET | Freq: Two times a day (BID) | ORAL | 0 refills | Status: DC

## 2018-11-10 NOTE — Patient Instructions (Addendum)
Medication Instructions:  Your physician has recommended you make the following change in your medication:  1. STOP Amiodarone 2. DECREASE Metoprolol to 12.5 mg twice daily for the next 30 days, then stop this medication.  * If you need a refill on your cardiac medications before your next appointment, please call your pharmacy.   Labwork: None ordered  Testing/Procedures: None ordered  Follow-Up: Your physician recommends that you schedule a follow-up appointment in: 3 months with Dr. Bronson Ing.  Your physician wants you to follow-up in: 6 months with Dr. Rayann Heman.  You will receive a reminder letter in the mail two months in advance. If you don't receive a letter, please call our office to schedule the follow-up appointment.  Thank you for choosing [facility]!!

## 2018-11-10 NOTE — Progress Notes (Signed)
PCP: Terald Sleeper, PA-C Primary Cardiologist: Dr Bronson Ing Primary EP: Dr Rayann Heman  [name] is a 68 y.o. female who presents today for routine electrophysiology followup.  Since last being seen in our clinic, the patient reports doing very well.  Her AF is well controlled.  She has rare palpitations.  Today, she denies symptoms of chest pain, shortness of breath,  lower extremity edema, dizziness, presyncope, or syncope.  The patient is otherwise without complaint today.   Past Medical History:  Diagnosis Date  . COPD (chronic obstructive pulmonary disease) ([place])   . GERD (gastroesophageal reflux disease)   . Hypercholesteremia   . Mild mitral regurgitation   . Mitral valve prolapse    a. dx 1985, not seen on echoes more recently.  . Obesity   . Paroxysmal atrial fibrillation ([facility])   . Paroxysmal atrial flutter ([place])    a. dx 05/2017.   Past Surgical History:  Procedure Laterality Date  . ABDOMINAL HYSTERECTOMY    . ATRIAL FIBRILLATION ABLATION N/A 05/04/2018   Procedure: ATRIAL FIBRILLATION ABLATION;  Surgeon: Thompson Grayer, MD;  Location: [facility];  Service: Cardiovascular;  Laterality: N/A;  . CARDIOVERSION N/A 10/08/2017   Procedure: CARDIOVERSION;  Surgeon: Herminio Commons, MD;  [facility];  Service: Cardiovascular;  Laterality: N/A;  . CESAREAN SECTION    . TEE WITHOUT CARDIOVERSION N/A 05/03/2018   Procedure: TRANSESOPHAGEAL ECHOCARDIOGRAM (TEE);  Surgeon: Fay Records, MD;  Location: [facility];  Service: Cardiovascular;  Laterality: N/A;  . TUBAL LIGATION      ROS- all systems are reviewed and negatives except as per HPI above  Current Outpatient Medications  Medication Sig Dispense Refill  . albuterol (PROAIR HFA) 108 (90 Base) MCG/ACT inhaler Inhale 2 puffs into the lungs every 6 (six) hours as needed for wheezing or shortness of breath. 1 Inhaler 11  . Cholecalciferol (VITAMIN D3) 5000 UNITS CAPS Take 5,000 Units by mouth daily.      Marland Kitchen CRANBERRY PO Take 325 mg by mouth daily.     Marland Kitchen docusate sodium (COLACE) 100 MG capsule Take 100 mg by mouth daily as needed for mild constipation.    Marland Kitchen doxycycline (VIBRA-TABS) 100 MG tablet Take 1 tablet (100 mg total) by mouth 2 (two) times daily. 1 po bid 20 tablet 0  . doxycycline (VIBRA-TABS) 100 MG tablet Take 1 tablet (100 mg total) by mouth 2 (two) times daily. 1 po bid 20 tablet 0  . ELIQUIS 5 MG TABS tablet TAKE (1) TABLET TWICE A DAY. 60 tablet 2  . Fluticasone-Umeclidin-Vilant (TRELEGY ELLIPTA) 100-62.5-25 MCG/INH AEPB Inhale 1 Units into the lungs daily. 60 each 11  . guaiFENesin (MUCINEX) 600 MG 12 hr tablet Take 600 mg by mouth every morning.     Vanessa Kick Ethyl (VASCEPA) 1 g CAPS Take 2 capsules (2 g total) by mouth 2 (two) times daily. (Needs to be seen) 120 capsule 3  . montelukast (SINGULAIR) 10 MG tablet Take 1 tablet (10 mg total) by mouth at bedtime. 90 tablet 3  . pantoprazole (PROTONIX) 20 MG tablet Take 1 tablet (20 mg total) by mouth daily. 90 tablet 3  . HYDROcodone-acetaminophen (NORCO/VICODIN) 5-325 MG tablet Take 1-2 tablets by mouth every 4 (four) hours as needed. (Patient not taking: Reported on 11/10/2018) 40 tablet 0  . methocarbamol (ROBAXIN) 500 MG tablet Take 1 tablet (500 mg total) by mouth every 6 (six) hours as needed for muscle spasms. (Patient not taking: Reported on  11/10/2018) 60 tablet 0  . metoprolol tartrate (LOPRESSOR) 25 MG tablet Take 0.5 tablets (12.5 mg total) by mouth 2 (two) times daily. For 30 days, then stop this medication 30 tablet 0   No current facility-administered medications for this visit.     Physical Exam: Vitals:   11/10/18 1124  BP: 132/80  Pulse: 67  SpO2: 99%  Weight: 218 lb (98.9 kg)  Height: 5' 6.5" (1.689 m)    GEN- The patient is well appearing, alert and oriented x 3 today.   Head- normocephalic, atraumatic Eyes-  Sclera clear, conjunctiva pink Ears- hearing intact Oropharynx- clear Lungs- Clear to  ausculation bilaterally, normal work of breathing Heart- Regular rate and rhythm, no murmurs, rubs or gallops, PMI not laterally displaced GI- soft, NT, ND, + BS Extremities- no clubbing, cyanosis, or edema  Wt Readings from Last 3 Encounters:  11/10/18 218 lb (98.9 kg)  10/20/18 217 lb 6.4 oz (98.6 kg)  10/06/18 219 lb 6.4 oz (99.5 kg)   Echo 11/05/18- EF 55-60%, mild MR  EKG tracing ordered today is personally reviewed and shows sinus rhythm  Assessment and Plan:  1. Persistent atrial fibrillation and atrial flutter Doing well post ablation stop amiodarone chads2vasc score is 2.  Continue anticoagulation for now, could consider stopping on return as per new guidelines Reduce metoprolol to 12.5mg  BID x 30 days then discontinue  2. Morbid obesity Body mass index is 34.66 kg/m. Lifestyle modification encouraged  3. Moderate to severe MR Echo 11/05/2018 reveals that her MR is now mild.  This was likely exacerbated by AF previously  4. Snoring she has not yet had her sleep study  Return in 3 months to see Dr Bronson Ing Return to see me in 6 months  Thompson Grayer MD, [facility] 11/10/2018 10:21 PM

## 2018-11-18 IMAGING — CT CT CHEST W/O CM
2 of 3 series · 15 of 36 positions shown, 18 images · non-contrast
Comparison: Chest x-ray dated 12/11/2017. Chest CT dated
08/24/2013.

CLINICAL DATA: Follow-up lung nodule seen on CT.  COPD.  Ex-smoker.

EXAM:
CT CHEST WITHOUT CONTRAST
TECHNIQUE: Multidetector CT imaging of the chest was performed following the
standard protocol without IV contrast.

[Series 2: thorax · axial · 0.66mm/px · z∈[+1826,+2122]mm · 12 of 174 slices shown, 15 images]
[im 13/174  mediastinal]
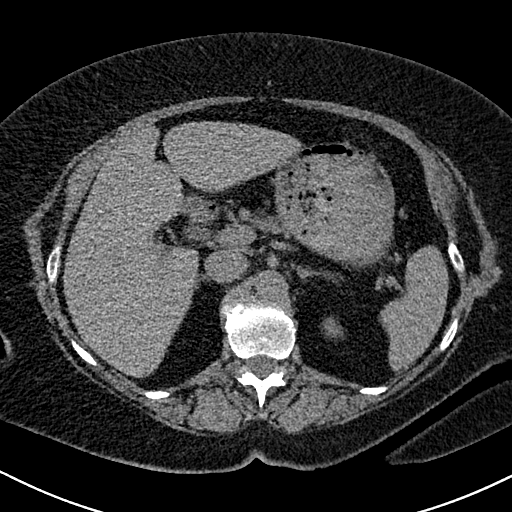
[im 13/174  lung]
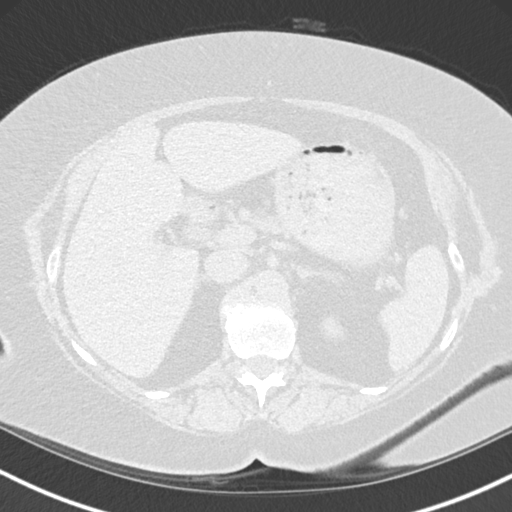
[im 26/174  lung]
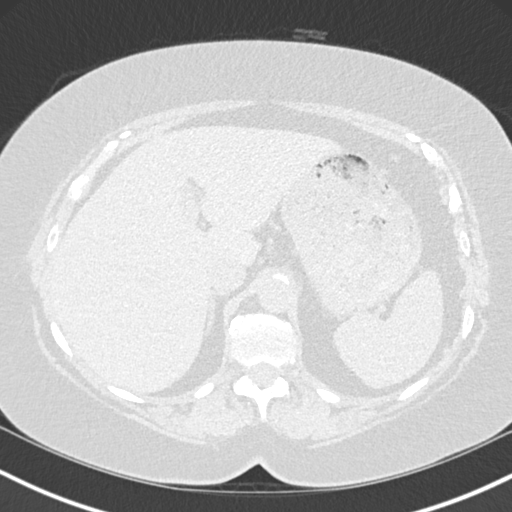
[im 39/174  lung]
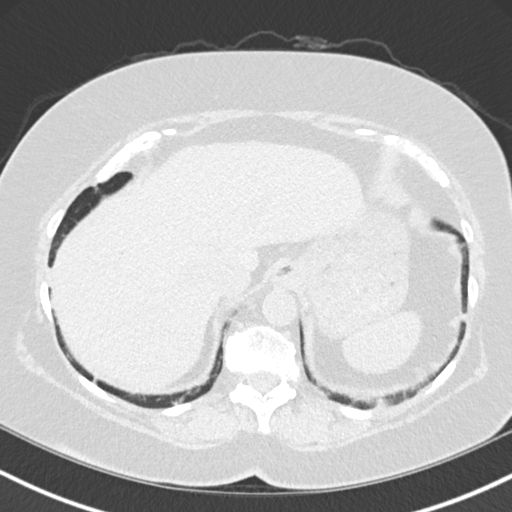
[im 52/174  lung]
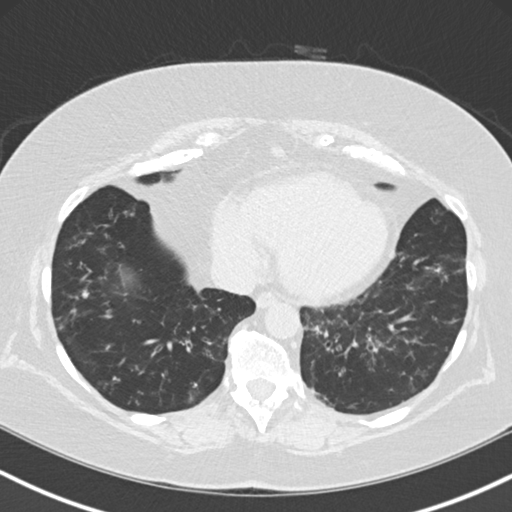
[im 65/174  mediastinal]
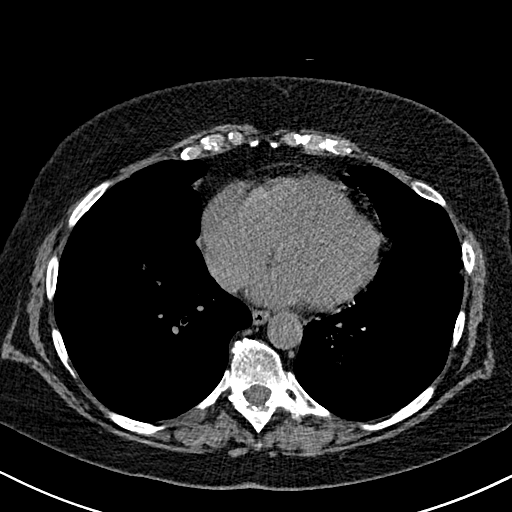
[im 65/174  lung]
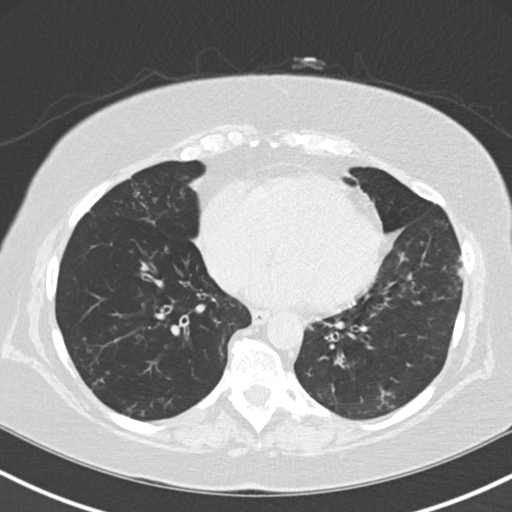
[im 77/174  lung]
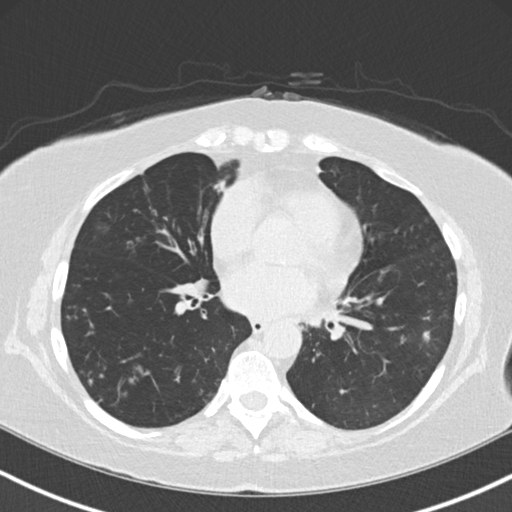
[im 97/174  lung]
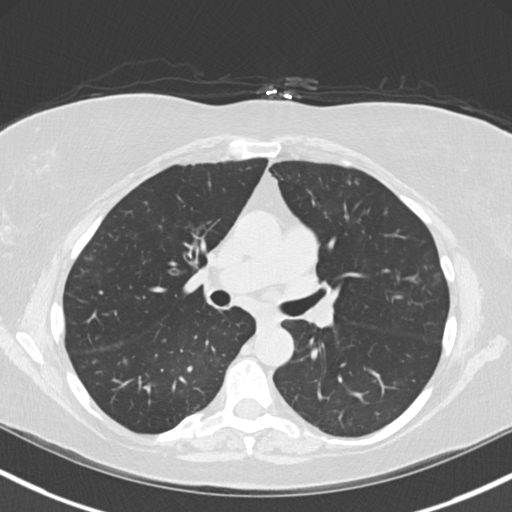
[im 109/174  lung]
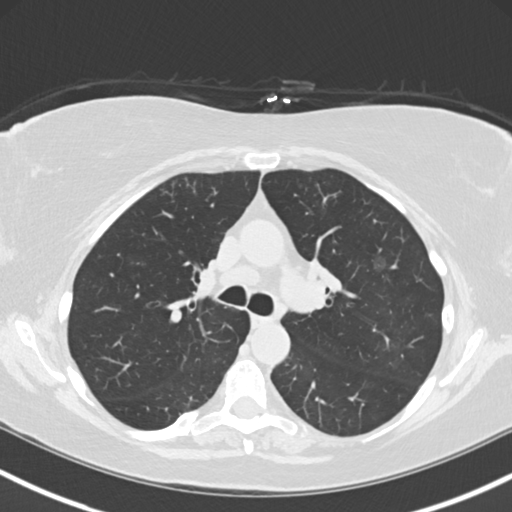
[im 122/174  mediastinal]
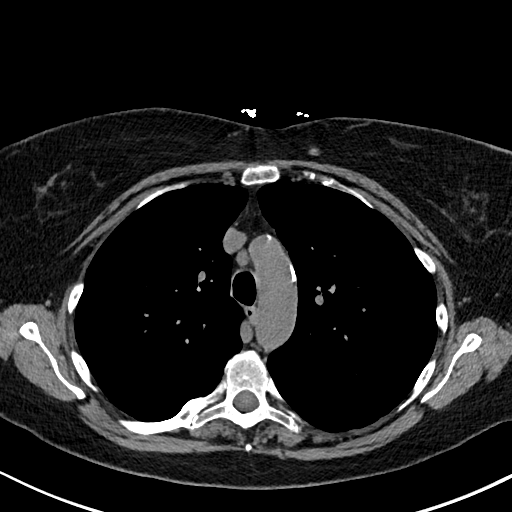
[im 122/174  lung]
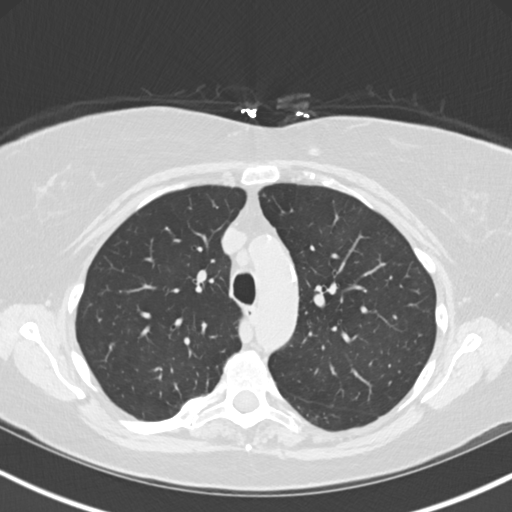
[im 135/174  lung]
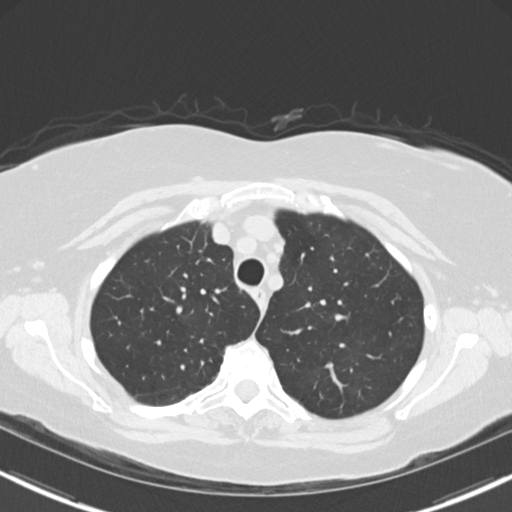
[im 148/174  lung]
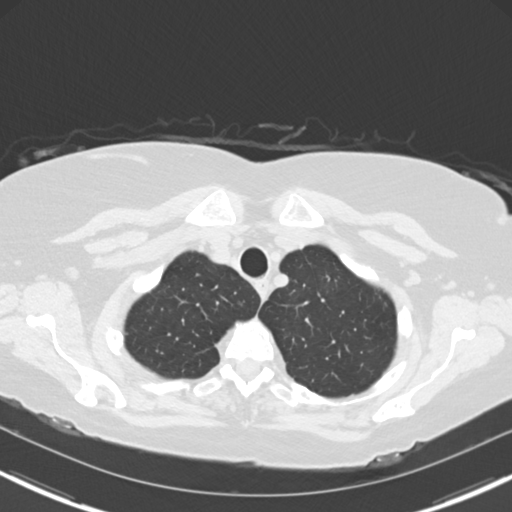
[im 161/174  lung]
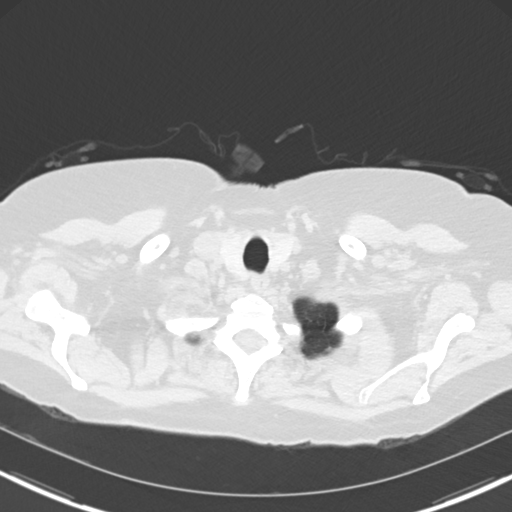

[Series 5: coronal · coronal · 0.69mm/px · 3 of 149 slices shown]
[im 30/149  lung]
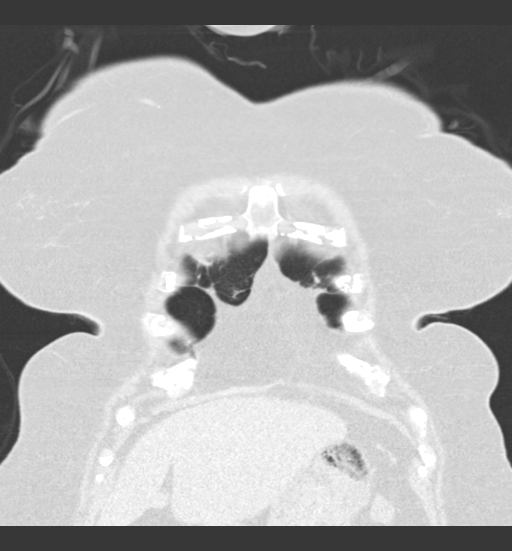
[im 60/149  lung]
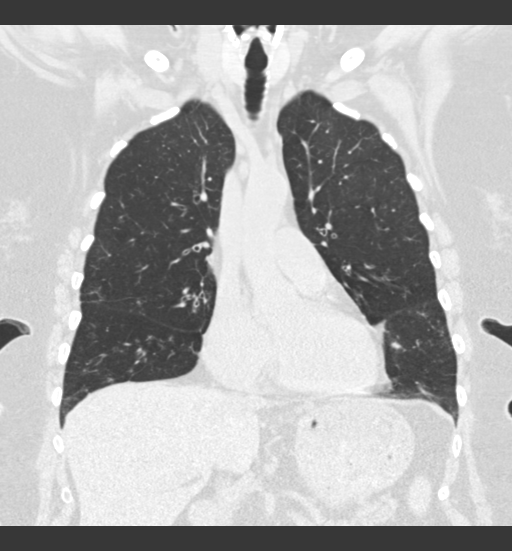
[im 89/149  lung]
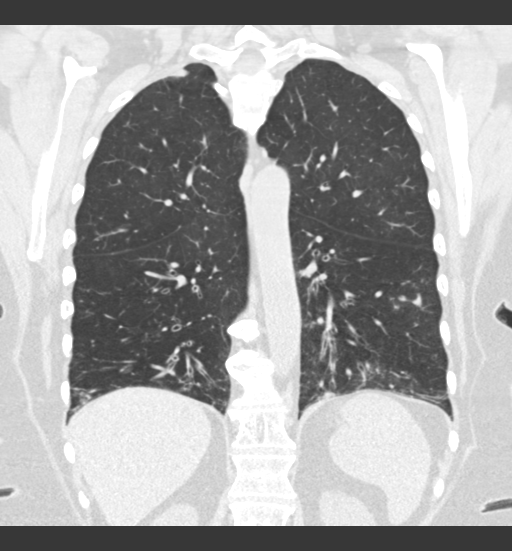

[15 of 36 positions shown; findings below may reference images not displayed]

FINDINGS: Cardiovascular: Heart size is normal. Coronary artery
calcifications. No thoracic aortic aneurysm. Scattered aortic
atherosclerosis.

Mediastinum/Nodes: No mass or pathologically enlarged lymph nodes
within the mediastinum or perihilar regions. Esophagus appears
normal. Trachea and central bronchi are unremarkable.

Lungs/Pleura:

New irregular pulmonary nodule within the left lower lobe, measuring
1.9 x 1.4 cm (series 4, image 102).

Additional new irregular nodule within the superior segment of the
left lower lobe, measuring 6 mm (series 4, image 74). Faint
ground-glass nodule within the left upper lobe measures 11 mm
(series 4, image 67).

Clustered small irregular nodules, largest measuring 6 mm, within
the lateral aspects of the left lower lobe (series 4, images 116 and
117).

Chronic micro nodularity, some components with tree in bud pattern,
throughout the periphery of the right lung, without significant
interval change compared to the earlier CT of 08/24/2013.

Upper Abdomen: Limited images of the upper abdomen are unremarkable.

Musculoskeletal: No acute or suspicious osseous finding. Incidental
note of a benign hemangioma within the T12 vertebral body.
IMPRESSION: 1. Multiple pulmonary nodules within the left lung, as detailed
above. The dominant irregular nodule within the left lower lobe is
highly suspicious for neoplastic mass, measuring 1.9 cm. Recommend
PET-CT or tissue sampling for further characterization.
2. Chronic micro nodularity throughout the periphery of the right
lung, some with tree in bud pattern, similar to the previous study
of 08/22/2013. Differential includes chronic bronchiolitis, SRII REZEKII,
fungal infections and viral infections. Favor chronic bronchiolitis.

Aortic Atherosclerosis (9XOTR-UBS.S).

These results will be called to the ordering clinician or
representative by the Radiologist Assistant, and communication
documented in the PACS or zVision Dashboard.

## 2018-11-19 MED ORDER — METOPROLOL TARTRATE 25 MG PO TABS: 12.5000 mg | ORAL_TABLET | Freq: Two times a day (BID) | ORAL | 3 refills | Status: DC

## 2018-11-29 ENCOUNTER — Other Ambulatory Visit: Payer: Self-pay | Admitting: Physician Assistant

## 2018-12-01 DIAGNOSIS — Z1231 Encounter for screening mammogram for malignant neoplasm of breast: Secondary | ICD-10-CM | POA: Diagnosis not present

## 2018-12-01 LAB — HM MAMMOGRAPHY

## 2018-12-04 ENCOUNTER — Encounter: Payer: Self-pay | Admitting: Physician Assistant

## 2018-12-06 ENCOUNTER — Other Ambulatory Visit: Payer: Self-pay | Admitting: Physician Assistant

## 2018-12-06 MED ORDER — DOXYCYCLINE HYCLATE 100 MG PO TABS: 100.0000 mg | ORAL_TABLET | Freq: Two times a day (BID) | ORAL | 0 refills | Status: DC

## 2018-12-07 ENCOUNTER — Other Ambulatory Visit: Payer: Self-pay | Admitting: Physician Assistant

## 2018-12-08 ENCOUNTER — Encounter: Payer: Self-pay | Admitting: Physician Assistant

## 2018-12-08 ENCOUNTER — Ambulatory Visit (INDEPENDENT_AMBULATORY_CARE_PROVIDER_SITE_OTHER): Payer: Medicare HMO | Admitting: Physician Assistant

## 2018-12-08 VITALS — BP 128/74 | HR 45 | Temp 97.8°F | Ht 66.0 in | Wt 214.0 lb

## 2018-12-08 DIAGNOSIS — J449 Chronic obstructive pulmonary disease, unspecified: Secondary | ICD-10-CM

## 2018-12-08 DIAGNOSIS — J42 Unspecified chronic bronchitis: Secondary | ICD-10-CM

## 2018-12-08 DIAGNOSIS — J209 Acute bronchitis, unspecified: Secondary | ICD-10-CM | POA: Diagnosis not present

## 2018-12-08 MED ORDER — METHYLPREDNISOLONE ACETATE 80 MG/ML IJ SUSP
80.0000 mg | Freq: Once | INTRAMUSCULAR | Status: AC
  Administered 2018-12-08: 80 mg via INTRAMUSCULAR

## 2018-12-08 MED ORDER — ALBUTEROL SULFATE (2.5 MG/3ML) 0.083% IN NEBU: 2.5000 mg | INHALATION_SOLUTION | Freq: Four times a day (QID) | RESPIRATORY_TRACT | 1 refills | Status: DC | PRN

## 2018-12-08 NOTE — Progress Notes (Signed)
BP 128/74   Pulse (!) 45   Temp 97.8 F (36.6 C) (Oral)   Ht 5\' 6"  (1.676 m)   Wt 214 lb (97.1 kg)   SpO2 (!) 77% Comment: room air  BMI 34.54 kg/m    Subjective:    Patient ID: [name], female    DOB: 02/27/1951, 68 y.o.   MRN: [id number]  HPI: [name] is a 68 y.o. female presenting on 12/08/2018 for Cough and Nasal Congestion  This patient comes in having a COPD exacerbation.  Over the past week she has had increasing cough and wheezing.  She states that she does not feel that the pro-air is working at all.  She has always had to use the pro-air because it works the best.  She is still on Trelegy.  When she got here into the office her oxygen was 77% on room air.  She has had episodes of hypoxia on and off for many months.  She keeps a pulse oximetry at home because she was dealing with some atrial fibrillation and rate issues.  That is well controlled now.  Past Medical History:  Diagnosis Date  . COPD (chronic obstructive pulmonary disease) ([place])   . GERD (gastroesophageal reflux disease)   . Hypercholesteremia   . Mild mitral regurgitation   . Mitral valve prolapse    a. dx 1985, not seen on echoes more recently.  . Obesity   . Paroxysmal atrial fibrillation (HCC)   . Paroxysmal atrial flutter ([place])    a. dx 05/2017.   Relevant past medical, surgical, family and social history reviewed and updated as indicated. Interim medical history since our last visit reviewed. Allergies and medications reviewed and updated. DATA REVIEWED: CHART IN EPIC  Family History reviewed for pertinent findings.  Review of Systems  Constitutional: Positive for chills and fatigue. Negative for activity change, appetite change and fever.  HENT: Positive for congestion, postnasal drip and sore throat.   Eyes: Negative.   Respiratory: Positive for cough, shortness of breath and wheezing.   Cardiovascular: Negative.  Negative for chest pain, palpitations and leg swelling.    Gastrointestinal: Negative.   Genitourinary: Negative.   Musculoskeletal: Negative.   Skin: Negative.   Neurological: Positive for headaches.    Allergies as of 12/08/2018      Reactions   Penicillins Rash   Has patient had a PCN reaction causing immediate rash, facial/tongue/throat swelling, SOB or lightheadedness with hypotension: Yes Has patient had a PCN reaction causing severe rash involving mucus membranes or skin necrosis: Yes Has patient had a PCN reaction that required hospitalization: No Has patient had a PCN reaction occurring within the last 10 years: No If all of the above answers are "NO", then may proceed with Cephalosporin use.   Dilaudid [hydromorphone Hcl]       Medication List       Accurate as of December 08, 2018 10:05 AM. Always use your most recent med list.        albuterol 108 (90 Base) MCG/ACT inhaler Commonly known as:  PROAIR HFA Inhale 2 puffs into the lungs every 6 (six) hours as needed for wheezing or shortness of breath.   albuterol (2.5 MG/3ML) 0.083% nebulizer solution Commonly known as:  PROVENTIL Take 3 mLs (2.5 mg total) by nebulization every 6 (six) hours as needed for wheezing or shortness of breath.   CRANBERRY PO Take 325 mg by mouth daily.   docusate sodium 100 MG capsule Commonly known as:  COLACE Take 100 mg by mouth daily as needed for mild constipation.   doxycycline 100 MG tablet Commonly known as:  VIBRA-TABS Take 1 tablet (100 mg total) by mouth 2 (two) times daily. 1 po bid   ELIQUIS 5 MG Tabs tablet Generic drug:  apixaban TAKE (1) TABLET TWICE A DAY.   Fluticasone-Umeclidin-Vilant 100-62.5-25 MCG/INH Aepb Commonly known as:  TRELEGY ELLIPTA Inhale 1 Units into the lungs daily.   guaiFENesin 600 MG 12 hr tablet Commonly known as:  MUCINEX Take 600 mg by mouth every morning.   Icosapent Ethyl 1 g Caps Commonly known as:  VASCEPA Take 2 capsules (2 g total) by mouth 2 (two) times daily. (Needs to be seen)    metoprolol tartrate 25 MG tablet Commonly known as:  LOPRESSOR Take 0.5 tablets (12.5 mg total) by mouth 2 (two) times daily.   montelukast 10 MG tablet Commonly known as:  SINGULAIR Take 1 tablet (10 mg total) by mouth at bedtime.   pantoprazole 20 MG tablet Commonly known as:  PROTONIX Take 1 tablet (20 mg total) by mouth daily.   Vitamin D3 125 MCG (5000 UT) Caps Take 5,000 Units by mouth daily.            Durable Medical Equipment  (From admission, onward)         Start     Ordered   12/08/18 0000  DME Nebulizer machine    Question:  Patient needs a nebulizer to treat with the following condition  Answer:  COPD (chronic obstructive pulmonary disease) ([place])   12/08/18 0929   12/08/18 0000  For home use only DME oxygen    Comments:  Portable oxygen. Room air oxygen 77%  Dx: COPD  Question Answer Comment  Mode or (Route) Nasal cannula   Liters per Minute 2   Frequency Continuous (stationary and portable oxygen unit needed)   Oxygen conserving device No   Oxygen delivery system Other see comments      12/08/18 0933             Objective:    BP 128/74   Pulse (!) 45   Temp 97.8 F (36.6 C) (Oral)   Ht 5\' 6"  (1.676 m)   Wt 214 lb (97.1 kg)   SpO2 (!) 77% Comment: room air  BMI 34.54 kg/m   Allergies  Allergen Reactions  . Penicillins Rash    Has patient had a PCN reaction causing immediate rash, facial/tongue/throat swelling, SOB or lightheadedness with hypotension: Yes Has patient had a PCN reaction causing severe rash involving mucus membranes or skin necrosis: Yes Has patient had a PCN reaction that required hospitalization: No Has patient had a PCN reaction occurring within the last 10 years: No If all of the above answers are "NO", then may proceed with Cephalosporin use.   . Dilaudid [Hydromorphone Hcl]     Wt Readings from Last 3 Encounters:  12/08/18 214 lb (97.1 kg)  11/10/18 218 lb (98.9 kg)  10/20/18 217 lb 6.4 oz (98.6 kg)     Physical Exam Constitutional:      Appearance: She is well-developed.  HENT:     Head: Normocephalic and atraumatic.     Right Ear: Drainage and tenderness present.     Left Ear: Drainage and tenderness present.     Nose: Mucosal edema and rhinorrhea present.     Right Sinus: No maxillary sinus tenderness or frontal sinus tenderness.     Left Sinus: No maxillary sinus tenderness or  frontal sinus tenderness.     Mouth/Throat:     Pharynx: Oropharyngeal exudate and posterior oropharyngeal erythema present.  Eyes:     Conjunctiva/sclera: Conjunctivae normal.     Pupils: Pupils are equal, round, and reactive to light.  Neck:     Musculoskeletal: Normal range of motion and neck supple.  Cardiovascular:     Rate and Rhythm: Normal rate and regular rhythm.     Heart sounds: Normal heart sounds.  Pulmonary:     Effort: Pulmonary effort is normal.     Breath sounds: Examination of the right-upper field reveals wheezing. Examination of the left-upper field reveals wheezing. Wheezing present.  Abdominal:     General: Bowel sounds are normal.     Palpations: Abdomen is soft.  Skin:    General: Skin is warm and dry.     Findings: No rash.  Neurological:     Mental Status: She is alert and oriented to person, place, and time.     Deep Tendon Reflexes: Reflexes are normal and symmetric.  Psychiatric:        Behavior: Behavior normal.        Thought Content: Thought content normal.        Judgment: Judgment normal.     Results for orders placed or performed in visit on 12/06/18  HM MAMMOGRAPHY  Result Value Ref Range   HM Mammogram 0-4 Bi-Rad 0-4 Bi-Rad, Self Reported Normal      Assessment & Plan:   1. COPD  GOLD III - DME Nebulizer machine - methylPREDNISolone acetate (DEPO-MEDROL) injection 80 mg - For home use only DME oxygen PORTABLE MACHINE PLEASE  2. Bronchitis, chronic with acute exacerbation (HCC) - albuterol (PROVENTIL) (2.5 MG/3ML) 0.083% nebulizer solution; Take 3  mLs (2.5 mg total) by nebulization every 6 (six) hours as needed for wheezing or shortness of breath.  Dispense: 240 mL; Refill: 1   Continue all other maintenance medications as listed above.  Follow up plan: Return in about 2 weeks (around 12/22/2018).  Educational handout given for [place] PA-C [facility] [address]  [place], [place] [address] [phone]   12/08/2018, 10:05 AM

## 2018-12-10 DIAGNOSIS — J449 Chronic obstructive pulmonary disease, unspecified: Secondary | ICD-10-CM | POA: Diagnosis not present

## 2018-12-27 ENCOUNTER — Ambulatory Visit (INDEPENDENT_AMBULATORY_CARE_PROVIDER_SITE_OTHER): Payer: Medicare HMO | Admitting: Physician Assistant

## 2018-12-27 ENCOUNTER — Encounter: Payer: Self-pay | Admitting: Physician Assistant

## 2018-12-27 VITALS — BP 129/70 | HR 74 | Temp 98.1°F | Ht 66.0 in | Wt 214.2 lb

## 2018-12-27 DIAGNOSIS — J42 Unspecified chronic bronchitis: Secondary | ICD-10-CM

## 2018-12-27 DIAGNOSIS — J209 Acute bronchitis, unspecified: Secondary | ICD-10-CM

## 2018-12-27 DIAGNOSIS — R0902 Hypoxemia: Secondary | ICD-10-CM

## 2018-12-27 MED ORDER — DOXYCYCLINE HYCLATE 100 MG PO TABS: 100.0000 mg | ORAL_TABLET | Freq: Two times a day (BID) | ORAL | 0 refills | Status: DC

## 2018-12-27 MED ORDER — METOPROLOL TARTRATE 25 MG PO TABS: 25.0000 mg | ORAL_TABLET | Freq: Two times a day (BID) | ORAL | 3 refills | Status: DC

## 2018-12-27 NOTE — Progress Notes (Signed)
BP 129/70   Pulse 74   Temp 98.1 F (36.7 C) (Oral)   Ht 5\' 6"  (1.676 m)   Wt 214 lb 3.2 oz (97.2 kg)   SpO2 91% Comment: 2 L  BMI 34.57 kg/m    Subjective:    Patient ID: [name], female    DOB: Jan 07, 1951, 68 y.o.   MRN: [id number]  HPI: [name] is a 68 y.o. female presenting on 12/27/2018 for Bronchitis (2 week follow up )  She comes in for a recheck on her recent COPD Bronchitis exacerbation. She has been feeling much better and the oxygen therapy is doing really well. She has minimal coughing now, and very little production. She does not nee d any refills at this time.  Past Medical History:  Diagnosis Date  . COPD (chronic obstructive pulmonary disease) ([place])   . GERD (gastroesophageal reflux disease)   . Hypercholesteremia   . Mild mitral regurgitation   . Mitral valve prolapse    a. dx 1985, not seen on echoes more recently.  . Obesity   . Paroxysmal atrial fibrillation (H[facility])   . Paroxysmal atrial flutter ([place])    a. dx 05/2017.   Relevant past medical, surgical, family and social history reviewed and updated as indicated. Interim medical history since our last visit reviewed. Allergies and medications reviewed and updated. DATA REVIEWED: CHART IN EPIC  Family History reviewed for pertinent findings.  Review of Systems  Constitutional: Negative.   HENT: Negative.   Eyes: Negative.   Respiratory: Positive for cough. Negative for wheezing.   Gastrointestinal: Negative.   Genitourinary: Negative.     Allergies as of 12/27/2018      Reactions   Penicillins Rash   Has patient had a PCN reaction causing immediate rash, facial/tongue/throat swelling, SOB or lightheadedness with hypotension: Yes Has patient had a PCN reaction causing severe rash involving mucus membranes or skin necrosis: Yes Has patient had a PCN reaction that required hospitalization: No Has patient had a PCN reaction occurring within the last 10 years: No If all of the above  answers are "NO", then may proceed with Cephalosporin use.   Dilaudid [hydromorphone Hcl]       Medication List       Accurate as of December 27, 2018  8:59 PM. Always use your most recent med list.        albuterol 108 (90 Base) MCG/ACT inhaler Commonly known as:  PROAIR HFA Inhale 2 puffs into the lungs every 6 (six) hours as needed for wheezing or shortness of breath.   albuterol (2.5 MG/3ML) 0.083% nebulizer solution Commonly known as:  PROVENTIL Take 3 mLs (2.5 mg total) by nebulization every 6 (six) hours as needed for wheezing or shortness of breath.   CRANBERRY PO Take 325 mg by mouth daily.   docusate sodium 100 MG capsule Commonly known as:  COLACE Take 100 mg by mouth daily as needed for mild constipation.   doxycycline 100 MG tablet Commonly known as:  VIBRA-TABS Take 1 tablet (100 mg total) by mouth 2 (two) times daily. 1 po bid   ELIQUIS 5 MG Tabs tablet Generic drug:  apixaban TAKE (1) TABLET TWICE A DAY.   Fluticasone-Umeclidin-Vilant 100-62.5-25 MCG/INH Aepb Commonly known as:  TRELEGY ELLIPTA Inhale 1 Units into the lungs daily.   guaiFENesin 600 MG 12 hr tablet Commonly known as:  MUCINEX Take 600 mg by mouth every morning.   Icosapent Ethyl 1 g Caps Commonly known as:  VASCEPA Take 2 capsules (2 g total) by mouth 2 (two) times daily. (Needs to be seen)   metoprolol tartrate 25 MG tablet Commonly known as:  LOPRESSOR Take 1 tablet (25 mg total) by mouth 2 (two) times daily.   montelukast 10 MG tablet Commonly known as:  SINGULAIR Take 1 tablet (10 mg total) by mouth at bedtime.   pantoprazole 20 MG tablet Commonly known as:  PROTONIX Take 1 tablet (20 mg total) by mouth daily.   Vitamin D3 125 MCG (5000 UT) Caps Take 5,000 Units by mouth daily.          Objective:    BP 129/70   Pulse 74   Temp 98.1 F (36.7 C) (Oral)   Ht 5\' 6"  (1.676 m)   Wt 214 lb 3.2 oz (97.2 kg)   SpO2 91% Comment: 2 L  BMI 34.57 kg/m   Allergies    Allergen Reactions  . Penicillins Rash    Has patient had a PCN reaction causing immediate rash, facial/tongue/throat swelling, SOB or lightheadedness with hypotension: Yes Has patient had a PCN reaction causing severe rash involving mucus membranes or skin necrosis: Yes Has patient had a PCN reaction that required hospitalization: No Has patient had a PCN reaction occurring within the last 10 years: No If all of the above answers are "NO", then may proceed with Cephalosporin use.   . Dilaudid [Hydromorphone Hcl]     Wt Readings from Last 3 Encounters:  12/27/18 214 lb 3.2 oz (97.2 kg)  12/08/18 214 lb (97.1 kg)  11/10/18 218 lb (98.9 kg)    Physical Exam Constitutional:      Appearance: She is well-developed.  HENT:     Head: Normocephalic and atraumatic.     Right Ear: Tympanic membrane, ear canal and external ear normal.     Left Ear: Tympanic membrane, ear canal and external ear normal.     Nose: Nose normal. No rhinorrhea.     Mouth/Throat:     Pharynx: No oropharyngeal exudate or posterior oropharyngeal erythema.  Eyes:     Conjunctiva/sclera: Conjunctivae normal.     Pupils: Pupils are equal, round, and reactive to light.  Neck:     Musculoskeletal: Normal range of motion and neck supple.  Cardiovascular:     Rate and Rhythm: Normal rate and regular rhythm.     Heart sounds: Normal heart sounds.  Pulmonary:     Effort: Pulmonary effort is normal.     Breath sounds: Normal breath sounds.  Abdominal:     General: Bowel sounds are normal.     Palpations: Abdomen is soft.  Skin:    General: Skin is warm and dry.     Findings: No rash.  Neurological:     Mental Status: She is alert and oriented to person, place, and time.     Deep Tendon Reflexes: Reflexes are normal and symmetric.  Psychiatric:        Behavior: Behavior normal.        Thought Content: Thought content normal.        Judgment: Judgment normal.     Results for orders placed or performed in visit  on 12/06/18  HM MAMMOGRAPHY  Result Value Ref Range   HM Mammogram 0-4 Bi-Rad 0-4 Bi-Rad, Self Reported Normal      Assessment & Plan:   1. Bronchitis, chronic with acute exacerbation ([place]) Continue medications Call if any worsening Current infection resolved  2. Hypoxia Continue oxygen therapy at 2L  Continue all other maintenance medications as listed above.  Follow up plan: Return if symptoms worsen or fail to improve.  Educational handout given for [place] PA-C [facility] [address]  [place], [place] [address] [phone]   12/27/2018, 8:59 PM

## 2019-01-06 ENCOUNTER — Other Ambulatory Visit: Payer: Self-pay | Admitting: Physician Assistant

## 2019-01-08 DIAGNOSIS — J449 Chronic obstructive pulmonary disease, unspecified: Secondary | ICD-10-CM | POA: Diagnosis not present

## 2019-02-08 DIAGNOSIS — J449 Chronic obstructive pulmonary disease, unspecified: Secondary | ICD-10-CM | POA: Diagnosis not present

## 2019-02-14 ENCOUNTER — Other Ambulatory Visit: Payer: Self-pay | Admitting: Physician Assistant

## 2019-02-14 NOTE — Telephone Encounter (Signed)
Last lipid 04/01/17

## 2019-02-18 ENCOUNTER — Ambulatory Visit: Payer: Medicare HMO | Admitting: Cardiovascular Disease

## 2019-02-25 ENCOUNTER — Encounter: Payer: Self-pay | Admitting: Physician Assistant

## 2019-02-25 ENCOUNTER — Other Ambulatory Visit: Payer: Self-pay | Admitting: Physician Assistant

## 2019-02-25 MED ORDER — DOXYCYCLINE HYCLATE 100 MG PO TABS: 100.0000 mg | ORAL_TABLET | Freq: Two times a day (BID) | ORAL | 0 refills | Status: DC

## 2019-03-10 DIAGNOSIS — J449 Chronic obstructive pulmonary disease, unspecified: Secondary | ICD-10-CM | POA: Diagnosis not present

## 2019-04-04 ENCOUNTER — Ambulatory Visit (INDEPENDENT_AMBULATORY_CARE_PROVIDER_SITE_OTHER): Payer: Medicare HMO | Admitting: Physician Assistant

## 2019-04-04 ENCOUNTER — Other Ambulatory Visit: Payer: Self-pay

## 2019-04-04 ENCOUNTER — Encounter: Payer: Self-pay | Admitting: Physician Assistant

## 2019-04-04 DIAGNOSIS — Z0000 Encounter for general adult medical examination without abnormal findings: Secondary | ICD-10-CM

## 2019-04-04 DIAGNOSIS — N3000 Acute cystitis without hematuria: Secondary | ICD-10-CM

## 2019-04-04 MED ORDER — DOXYCYCLINE HYCLATE 100 MG PO TABS: 100.0000 mg | ORAL_TABLET | Freq: Two times a day (BID) | ORAL | 1 refills | Status: DC

## 2019-04-05 ENCOUNTER — Encounter: Payer: Self-pay | Admitting: Physician Assistant

## 2019-04-05 NOTE — Progress Notes (Signed)
   Telephone visit  Subjective: CC:uti PCP: Jones, [name], PA-C HPI:[name] is a 68 y.o. female calls for telephone consult today. Patient provides verbal consent for consult held via phone.  Patient is identified with 2 separate identifiers.  At this time the entire area is on COVID-19 social distancing and stay home orders are in place.  Patient is of higher risk and therefore we are performing this by a virtual method.  Location of patient: home Location of provider: [facility] Others present for call: no  This patient has had several days of dysuria, frequency and nocturia. There is also pain over the bladder in the suprapubic region, no back pain. Denies leakage or hematuria.  Denies fever or chills. No pain in flank area.    ROS: Per HPI  Allergies  Allergen Reactions  . Penicillins Rash    Has patient had a PCN reaction causing immediate rash, facial/tongue/throat swelling, SOB or lightheadedness with hypotension: Yes Has patient had a PCN reaction causing severe rash involving mucus membranes or skin necrosis: Yes Has patient had a PCN reaction that required hospitalization: No Has patient had a PCN reaction occurring within the last 10 years: No If all of the above answers are "NO", then may proceed with Cephalosporin use.   . Dilaudid [Hydromorphone Hcl]    Past Medical History:  Diagnosis Date  . COPD (chronic obstructive pulmonary disease) ([facility])   . GERD (gastroesophageal reflux disease)   . Hypercholesteremia   . Mild mitral regurgitation   . Mitral valve prolapse    a. dx 1985, not seen on echoes more recently.  . Obesity   . Paroxysmal atrial fibrillation (H[facility])   . Paroxysmal atrial flutter ([facility])    a. dx 05/2017.    Current Outpatient Medications:  .  albuterol (PROAIR HFA) 108 (90 Base) MCG/ACT inhaler, Inhale 2 puffs into the lungs every 6 (six) hours as needed for wheezing or shortness of breath., Disp: 1 Inhaler, Rfl: 11 .  albuterol  (PROVENTIL) (2.5 MG/3ML) 0.083% nebulizer solution, Take 3 mLs (2.5 mg total) by nebulization every 6 (six) hours as needed for wheezing or shortness of breath., Disp: 240 mL, Rfl: 1 .  Cholecalciferol (VITAMIN D3) 5000 UNITS CAPS, Take 5,000 Units by mouth daily. , Disp: , Rfl:  .  CRANBERRY PO, Take 325 mg by mouth daily. , Disp: , Rfl:  .  docusate sodium (COLACE) 100 MG capsule, Take 100 mg by mouth daily as needed for mild constipation., Disp: , Rfl:  .  doxycycline (VIBRA-TABS) 100 MG tablet, Take 1 tablet (100 mg total) by mouth 2 (two) times daily. 1 po bid, Disp: 20 tablet, Rfl: 1 .  ELIQUIS 5 MG TABS tablet, TAKE (1) TABLET TWICE A DAY., Disp: 60 tablet, Rfl: 4 .  Fluticasone-Umeclidin-Vilant (TRELEGY ELLIPTA) 100-62.5-25 MCG/INH AEPB, Inhale 1 Units into the lungs daily., Disp: 60 each, Rfl: 11 .  guaiFENesin (MUCINEX) 600 MG 12 hr tablet, Take 600 mg by mouth every morning. , Disp: , Rfl:  .  metoprolol tartrate (LOPRESSOR) 25 MG tablet, Take 1 tablet (25 mg total) by mouth 2 (two) times daily., Disp: 180 tablet, Rfl: 3 .  montelukast (SINGULAIR) 10 MG tablet, Take 1 tablet (10 mg total) by mouth at bedtime., Disp: 90 tablet, Rfl: 3 .  pantoprazole (PROTONIX) 20 MG tablet, Take 1 tablet (20 mg total) by mouth daily., Disp: 90 tablet, Rfl: 3 .  VASCEPA 1 g CAPS, TAKE (2) CAPSULES TWICE DAILY., Disp: 120   capsule, Rfl: 3  Assessment/ Plan: 68 y.o. female   1. Acute cystitis without hematuria - doxycycline (VIBRA-TABS) 100 MG tablet; Take 1 tablet (100 mg total) by mouth 2 (two) times daily. 1 po bid  Dispense: 20 tablet; Refill: 1  2. Well adult exam - CBC with Differential/Platelet; Future - CMP14+EGFR; Future - Lipid panel; Future - Thyroid Panel With TSH; Future   Start time: 3:33 PM End time: 3:43 PM  Meds ordered this encounter  Medications  . doxycycline (VIBRA-TABS) 100 MG tablet    Sig: Take 1 tablet (100 mg total) by mouth 2 (two) times daily. 1 po bid    Dispense:   20 tablet    Refill:  1    Order Specific Question:   Supervising Provider    Answer:   Janora Norlander [[id number]]    Particia Nearing PA-C [place] [phone]

## 2019-04-10 DIAGNOSIS — J449 Chronic obstructive pulmonary disease, unspecified: Secondary | ICD-10-CM | POA: Diagnosis not present

## 2019-04-13 ENCOUNTER — Other Ambulatory Visit: Payer: Self-pay

## 2019-04-14 ENCOUNTER — Other Ambulatory Visit: Payer: Self-pay

## 2019-04-15 ENCOUNTER — Ambulatory Visit (INDEPENDENT_AMBULATORY_CARE_PROVIDER_SITE_OTHER): Payer: Medicare HMO | Admitting: Physician Assistant

## 2019-04-15 ENCOUNTER — Encounter: Payer: Self-pay | Admitting: Physician Assistant

## 2019-04-15 VITALS — BP 141/83 | HR 73 | Temp 97.6°F | Ht 66.0 in | Wt 214.0 lb

## 2019-04-15 DIAGNOSIS — N3281 Overactive bladder: Secondary | ICD-10-CM

## 2019-04-15 DIAGNOSIS — R300 Dysuria: Secondary | ICD-10-CM

## 2019-04-15 LAB — MICROSCOPIC EXAMINATION: RBC: NONE SEEN /hpf (ref 0–2)

## 2019-04-15 LAB — URINALYSIS, COMPLETE
Bilirubin, UA: NEGATIVE
Glucose, UA: NEGATIVE
Ketones, UA: NEGATIVE
Nitrite, UA: NEGATIVE
Protein,UA: NEGATIVE
Specific Gravity, UA: 1.02 (ref 1.005–1.030)
Urobilinogen, Ur: 0.2 mg/dL (ref 0.2–1.0)
pH, UA: 6.5 (ref 5.0–7.5)

## 2019-04-15 MED ORDER — TOLTERODINE TARTRATE 2 MG PO TABS: 2.0000 mg | ORAL_TABLET | Freq: Two times a day (BID) | ORAL | 11 refills | Status: DC

## 2019-04-15 NOTE — Patient Instructions (Signed)

## 2019-04-15 NOTE — Progress Notes (Signed)
BP (!) 141/83   Pulse 73   Temp 97.6 F (36.4 C) (Oral)   Ht 5\' 6"  (1.676 m)   Wt 214 lb (97.1 kg)   BMI 34.54 kg/m    Subjective:    Patient ID: [name], female    DOB: 08/11/1951, 68 y.o.   MRN: [id number]  HPI: [name] is a 68 y.o. female presenting on 04/15/2019 for Dysuria, Urinary Tract Infection, and Abdominal Pain  Patient comes in to be evaluated for dysuria.  She has had some recent urinary tract infections over the past few months.  She does not usually have this happen.  She states that she is starting to have some incontinence.  It always happens when she is held it for a long time and is getting ready to go to the bathroom.  For instance after she has driven a long time and gets out of the car she will have voiding then.  She is not complaining of any leakage when she coughs, sneezes, bends, lifts.  Has not had a hysterectomy.  She does wear a pad at all times.  Past Medical History:  Diagnosis Date  . COPD (chronic obstructive pulmonary disease) ([place])   . GERD (gastroesophageal reflux disease)   . Hypercholesteremia   . Mild mitral regurgitation   . Mitral valve prolapse    a. dx 1985, not seen on echoes more recently.  . Obesity   . Paroxysmal atrial fibrillation ([facility])   . Paroxysmal atrial flutter ([place])    a. dx 05/2017.   Relevant past medical, surgical, family and social history reviewed and updated as indicated. Interim medical history since our last visit reviewed. Allergies and medications reviewed and updated. DATA REVIEWED: CHART IN EPIC  Family History reviewed for pertinent findings.  Review of Systems  Constitutional: Negative.   HENT: Negative.   Eyes: Negative.   Respiratory: Negative.   Gastrointestinal: Negative.   Genitourinary: Positive for dysuria, enuresis, frequency and urgency.    Allergies as of 04/15/2019      Reactions   Penicillins Rash   Has patient had a PCN reaction causing immediate rash,  facial/tongue/throat swelling, SOB or lightheadedness with hypotension: Yes Has patient had a PCN reaction causing severe rash involving mucus membranes or skin necrosis: Yes Has patient had a PCN reaction that required hospitalization: No Has patient had a PCN reaction occurring within the last 10 years: No If all of the above answers are "NO", then may proceed with Cephalosporin use.   Dilaudid [hydromorphone Hcl]       Medication List       Accurate as of April 15, 2019  1:50 PM. If you have any questions, ask your nurse or doctor.        STOP taking these medications   doxycycline 100 MG tablet Commonly known as: VIBRA-TABS Stopped by: Terald Sleeper, PA-C     TAKE these medications   albuterol 108 (90 Base) MCG/ACT inhaler Commonly known as: ProAir HFA Inhale 2 puffs into the lungs every 6 (six) hours as needed for wheezing or shortness of breath.   albuterol (2.5 MG/3ML) 0.083% nebulizer solution Commonly known as: PROVENTIL Take 3 mLs (2.5 mg total) by nebulization every 6 (six) hours as needed for wheezing or shortness of breath.   CRANBERRY PO Take 325 mg by mouth daily.   docusate sodium 100 MG capsule Commonly known as: COLACE Take 100 mg by mouth daily as needed for mild constipation.  Eliquis 5 MG Tabs tablet Generic drug: apixaban TAKE (1) TABLET TWICE A DAY.   Fluticasone-Umeclidin-Vilant 100-62.5-25 MCG/INH Aepb Commonly known as: Trelegy Ellipta Inhale 1 Units into the lungs daily.   guaiFENesin 600 MG 12 hr tablet Commonly known as: MUCINEX Take 600 mg by mouth every morning.   metoprolol tartrate 25 MG tablet Commonly known as: LOPRESSOR Take 1 tablet (25 mg total) by mouth 2 (two) times daily.   montelukast 10 MG tablet Commonly known as: SINGULAIR Take 1 tablet (10 mg total) by mouth at bedtime.   pantoprazole 20 MG tablet Commonly known as: PROTONIX Take 1 tablet (20 mg total) by mouth daily.   tolterodine 2 MG tablet Commonly known  as: DETROL Take 1 tablet (2 mg total) by mouth 2 (two) times daily. Started by: Terald Sleeper, PA-C   Vascepa 1 g Caps Generic drug: Icosapent Ethyl TAKE (2) CAPSULES TWICE DAILY.   Vitamin D3 125 MCG (5000 UT) Caps Take 5,000 Units by mouth daily.          Objective:    BP (!) 141/83   Pulse 73   Temp 97.6 F (36.4 C) (Oral)   Ht 5\' 6"  (1.676 m)   Wt 214 lb (97.1 kg)   BMI 34.54 kg/m   Allergies  Allergen Reactions  . Penicillins Rash    Has patient had a PCN reaction causing immediate rash, facial/tongue/throat swelling, SOB or lightheadedness with hypotension: Yes Has patient had a PCN reaction causing severe rash involving mucus membranes or skin necrosis: Yes Has patient had a PCN reaction that required hospitalization: No Has patient had a PCN reaction occurring within the last 10 years: No If all of the above answers are "NO", then may proceed with Cephalosporin use.   . Dilaudid [Hydromorphone Hcl]     Wt Readings from Last 3 Encounters:  04/15/19 214 lb (97.1 kg)  12/27/18 214 lb 3.2 oz (97.2 kg)  12/08/18 214 lb (97.1 kg)    Physical Exam Constitutional:      Appearance: She is well-developed.  HENT:     Head: Normocephalic and atraumatic.  Eyes:     Conjunctiva/sclera: Conjunctivae normal.     Pupils: Pupils are equal, round, and reactive to light.  Cardiovascular:     Rate and Rhythm: Normal rate and regular rhythm.     Heart sounds: Normal heart sounds.  Pulmonary:     Effort: Pulmonary effort is normal.     Breath sounds: Normal breath sounds.  Abdominal:     General: Bowel sounds are normal.     Palpations: Abdomen is soft.     Tenderness: There is no abdominal tenderness.  Skin:    General: Skin is warm and dry.     Findings: No rash.  Neurological:     Mental Status: She is alert and oriented to person, place, and time.     Deep Tendon Reflexes: Reflexes are normal and symmetric.  Psychiatric:        Behavior: Behavior normal.         Thought Content: Thought content normal.        Judgment: Judgment normal.     Results for orders placed or performed in visit on 04/15/19  Microscopic Examination   URINE  Result Value Ref Range   WBC, UA 6-10 (A) 0 - 5 /hpf   RBC None seen 0 - 2 /hpf   Epithelial Cells (non renal) 0-10 0 - 10 /hpf   Mucus, UA Present (  A) Not Estab.   Bacteria, UA Moderate (A) None seen/Few  Urinalysis, Complete  Result Value Ref Range   Specific Gravity, UA 1.020 1.005 - 1.030   pH, UA 6.5 5.0 - 7.5   Color, UA Yellow Yellow   Appearance Ur Clear Clear   Leukocytes,UA 1+ (A) Negative   Protein,UA Negative Negative/Trace   Glucose, UA Negative Negative   Ketones, UA Negative Negative   RBC, UA Trace (A) Negative   Bilirubin, UA Negative Negative   Urobilinogen, Ur 0.2 0.2 - 1.0 mg/dL   Nitrite, UA Negative Negative   Microscopic Examination See below:       Assessment & Plan:   1. Dysuria - Urinalysis, Complete - Urine Culture - Microscopic Examination  2. OAB (overactive bladder) - tolterodine (DETROL) 2 MG tablet; Take 1 tablet (2 mg total) by mouth 2 (two) times daily.  Dispense: 30 tablet; Refill: 11   Continue all other maintenance medications as listed above.  Follow up plan: Return in about 3 months (around 07/16/2019) for recheck medications.  Educational handout given for overactive bladder  Terald Sleeper PA-C [facility] [address]  [place], [place] [address] [phone]   04/15/2019, 1:50 PM

## 2019-04-17 LAB — URINE CULTURE: Organism ID, Bacteria: NO GROWTH

## 2019-04-20 ENCOUNTER — Other Ambulatory Visit: Payer: Self-pay | Admitting: *Deleted

## 2019-04-20 DIAGNOSIS — N3281 Overactive bladder: Secondary | ICD-10-CM

## 2019-04-20 MED ORDER — TOLTERODINE TARTRATE 2 MG PO TABS: 2.0000 mg | ORAL_TABLET | Freq: Two times a day (BID) | ORAL | 6 refills | Status: DC

## 2019-04-20 MED ORDER — TOLTERODINE TARTRATE 2 MG PO TABS: 2.0000 mg | ORAL_TABLET | Freq: Two times a day (BID) | ORAL | 11 refills | Status: DC

## 2019-05-10 DIAGNOSIS — J449 Chronic obstructive pulmonary disease, unspecified: Secondary | ICD-10-CM | POA: Diagnosis not present

## 2019-05-14 ENCOUNTER — Other Ambulatory Visit: Payer: Self-pay | Admitting: Physician Assistant

## 2019-05-16 ENCOUNTER — Ambulatory Visit: Payer: Medicare HMO | Admitting: Internal Medicine

## 2019-05-18 ENCOUNTER — Telehealth: Payer: Self-pay | Admitting: Physician Assistant

## 2019-05-18 NOTE — Chronic Care Management (AMB) (Signed)
Chronic Care Management   Note  05/18/2019 Name: [name] MRN: [id number] DOB: [birth year]  [name] is a 68 y.o. year old female who is a primary care patient of Terald Sleeper, PA-C. I reached out to [name] by phone today in response to a referral sent by [name]'s health plan.    [name] was given information about Chronic Care Management services today including:  1. CCM service includes personalized support from designated clinical staff supervised by her physician, including individualized plan of care and coordination with other care providers 2. 24/7 contact phone numbers for assistance for urgent and routine care needs. 3. Service will only be billed when office clinical staff spend 20 minutes or more in a month to coordinate care. 4. Only one practitioner may furnish and bill the service in a calendar month. 5. The patient may stop CCM services at any time (effective at the end of the month) by phone call to the office staff. 6. The patient will be responsible for cost sharing (co-pay) of up to 20% of the service fee (after annual deductible is met).  Patient agreed to services and verbal consent obtained.   Follow up plan: Telephone appointment with CCM team member scheduled for: 05/25/2019  Ulysses  ??[email or web address]   ??[phone]

## 2019-05-25 ENCOUNTER — Ambulatory Visit: Payer: Medicare HMO | Admitting: *Deleted

## 2019-05-25 DIAGNOSIS — R911 Solitary pulmonary nodule: Secondary | ICD-10-CM

## 2019-05-25 DIAGNOSIS — I4891 Unspecified atrial fibrillation: Secondary | ICD-10-CM

## 2019-05-25 DIAGNOSIS — J449 Chronic obstructive pulmonary disease, unspecified: Secondary | ICD-10-CM

## 2019-06-03 ENCOUNTER — Telehealth: Payer: Self-pay

## 2019-06-03 NOTE — Telephone Encounter (Signed)

## 2019-06-04 NOTE — Progress Notes (Signed)
Cardiology Office Note Date:  06/06/2019  Patient ID:  [name], [name] 06-Mar-1951, MRN [id number] PCP:  Terald Sleeper, PA-C  Cardiologist:  Dr. Bronson Ing Electrophysiologist: Dr. Rayann Heman    Chief Complaint: 6 mo planned f/u  History of Present Illness: [name] is a 68 y.o. female with history of COPD (GOLD III), GERD, HLD, persistent Afib and flutter.  She comes today to be seen for dr. Rayann Heman.  She last saw him jan this year, she was doing well, with rare palpitations since her ablation and her amiodarone was stopped.  There was mention of consideration to stop her a/c given new guidelines given her score of 2 includes female gender.  She was pending/not yet had sleep study to evaluate her snoring.  Felt her prior echos of severe MR likely worsened by the AFib with subsequent echos describing her MR as mild.  She is doing very well.  She reports since her last visit 2/2 her COPD she is now on n/c O2, most days all day, this being managed by her PMD.  She has rare palpitations, maybe 10-15 minutes very infrequently, unchanged from before stopping the amiodarone.  No dizziness, near syncope or syncope, no CP She has not pursued sleep study, states since being started on O2 she is sleeping much better with no reports of ongoing snoring  No bleeding or signs of bleeding.  She has labs ordered/due via her PMD office just hasn't had them drawn yet.  No symptoms of illness  AF Hx PVI/CTI ablation 05/04/18, Dr. Rayann Heman AAD Amiodarone, stopped Jan 2020 post ablation/maintaining SR   Past Medical History:  Diagnosis Date  . COPD (chronic obstructive pulmonary disease) ([place])   . GERD (gastroesophageal reflux disease)   . Hypercholesteremia   . Mild mitral regurgitation   . Mitral valve prolapse    a. dx 1985, not seen on echoes more recently.  . Obesity   . Paroxysmal atrial fibrillation ([facility])   . Paroxysmal atrial flutter ([place])    a. dx 05/2017.    Past Surgical History:   Procedure Laterality Date  . ABDOMINAL HYSTERECTOMY    . ATRIAL FIBRILLATION ABLATION N/A 05/04/2018   Procedure: ATRIAL FIBRILLATION ABLATION;  Surgeon: Thompson Grayer, MD;  Location: [facility];  Service: Cardiovascular;  Laterality: N/A;  . CARDIOVERSION N/A 10/08/2017   Procedure: CARDIOVERSION;  Surgeon: Herminio Commons, MD;  Location[facility];  Service: Cardiovascular;  Laterality: N/A;  . CESAREAN SECTION    . TEE WITHOUT CARDIOVERSION N/A 05/03/2018   Procedure: TRANSESOPHAGEAL ECHOCARDIOGRAM (TEE);  Surgeon: Fay Records, MD;  Location: [facility] ENDOSCOPY;  Service: Cardiovascular;  Laterality: N/A;  . TUBAL LIGATION      Current Outpatient Medications  Medication Sig Dispense Refill  . albuterol (PROAIR HFA) 108 (90 Base) MCG/ACT inhaler Inhale 2 puffs into the lungs every 6 (six) hours as needed for wheezing or shortness of breath. 1 Inhaler 11  . albuterol (PROVENTIL) (2.5 MG/3ML) 0.083% nebulizer solution Take 3 mLs (2.5 mg total) by nebulization every 6 (six) hours as needed for wheezing or shortness of breath. 240 mL 1  . Cholecalciferol (VITAMIN D3) 5000 UNITS CAPS Take 5,000 Units by mouth daily.     Marland Kitchen CRANBERRY PO Take 325 mg by mouth daily.     Marland Kitchen docusate sodium (COLACE) 100 MG capsule Take 100 mg by mouth daily as needed for mild constipation.    Marland Kitchen ELIQUIS 5 MG TABS tablet TAKE (1) TABLET TWICE  A DAY. 60 tablet 0  . Fluticasone-Umeclidin-Vilant (TRELEGY ELLIPTA) 100-62.5-25 MCG/INH AEPB Inhale 1 Units into the lungs daily. 60 each 11  . guaiFENesin (MUCINEX) 600 MG 12 hr tablet Take 600 mg by mouth every morning.     . montelukast (SINGULAIR) 10 MG tablet Take 1 tablet (10 mg total) by mouth at bedtime. 90 tablet 3  . pantoprazole (PROTONIX) 20 MG tablet Take 1 tablet (20 mg total) by mouth daily. 90 tablet 3  . tolterodine (DETROL) 2 MG tablet Take 1 tablet (2 mg total) by mouth 2 (two) times daily. 60 tablet 6  . VASCEPA 1 g CAPS TAKE (2) CAPSULES TWICE DAILY.  120 capsule 3  . metoprolol tartrate (LOPRESSOR) 25 MG tablet Take 1 tablet (25 mg total) by mouth 2 (two) times daily. 180 tablet 3   No current facility-administered medications for this visit.     Allergies:   Penicillins and Dilaudid [hydromorphone hcl]   Social History:  The patient  reports that she quit smoking about 28 years ago. Her smoking use included cigarettes. She has a 20.00 pack-year smoking history. She has never used smokeless tobacco. She reports that she does not drink alcohol or use drugs.   Family History:  The patient's family history includes Asthma in her mother; Atrial fibrillation in her brother; Emphysema in her father; Heart attack (age of onset: 67) in her mother; Hypertension in her son.  ROS:  Please see the history of present illness.  All other systems are reviewed and otherwise negative.   PHYSICAL EXAM:  VS:  BP 122/74   Pulse 73   Ht 5\' 6"  (1.676 m)   Wt 212 lb (96.2 kg)   BMI 34.22 kg/m  BMI: Body mass index is 34.22 kg/m. Well nourished, well developed, in no acute distress  HEENT: normocephalic, atraumatic  Neck: no JVD, carotid bruits or masses Cardiac:  RRR; no significant murmurs, no rubs, or gallops Lungs:  CTA b/l, no wheezing, rhonchi or rales  Abd: soft, nontender MS: no deformity or atrophy Ext: no edema  Skin: warm and dry, no rash Neuro:  No gross deficits appreciated Psych: euthymic mood, full affect     EKG:  Not done today   11/05/2018: TTE Study Conclusions - Left ventricle: The cavity size was normal. There was mild   concentric hypertrophy. Systolic function was normal. The   estimated ejection fraction was in the range of 55% to 60%. Wall   motion was normal; there were no regional wall motion   abnormalities. Features are consistent with a pseudonormal left   ventricular filling pattern, with concomitant abnormal relaxation   and increased filling pressure (grade 2 diastolic dysfunction).   Doppler parameters  are consistent with indeterminate ventricular   filling pressure. - Aortic valve: Transvalvular velocity was within the normal range.   There was no stenosis. There was no regurgitation. - Mitral valve: Transvalvular velocity was within the normal range.   There was no evidence for stenosis. There was mild regurgitation. - Right ventricle: The cavity size was normal. Wall thickness was   normal. Systolic function was normal. - Atrial septum: No defect or patent foramen ovale was identified. - Tricuspid valve: There was trivial regurgitation. - Pulmonary arteries: Systolic pressure was within the normal   range. PA peak pressure: 27 mm Hg (S).   05/04/18: EPS/Ablation CONCLUSIONS: 1. Counter clockwise isthmus dependant right atrial flutter upon presentation.   2. Intracardiac echo reveals a moderate sized left atrium with four  separate pulmonary veins without evidence of pulmonary vein stenosis. 3. Successful electrical isolation and anatomical encircling of all four pulmonary veins with radiofrequency current.    4. Cavo-tricuspid isthmus ablation was performed with complete bidirectional isthmus block achieved.  5. No inducible arrhythmias following ablation both on and off of Isuprel 6. No early apparent complications.   Recent Labs: 08/23/2018: ALT 22; BUN 11; Creatinine, Ser 0.95; Hemoglobin 12.8; Platelets 315; Potassium 4.7; Sodium 140; TSH 2.540  08/23/2018: Chol/HDL Ratio 5.1; Cholesterol, Total 229; HDL 45; LDL Calculated 156; Triglycerides 138   CrCl cannot be calculated (Patient's most recent lab result is older than the maximum 21 days allowed.).   Wt Readings from Last 3 Encounters:  06/06/19 212 lb (96.2 kg)  04/15/19 214 lb (97.1 kg)  12/27/18 214 lb 3.2 oz (97.2 kg)     Other studies reviewed: Additional studies/records reviewed today include: summarized above  ASSESSMENT AND PLAN:  1. Persistent Afib, and flutter     S/p PVI and CTI ablation last year with Dr.  Rayann Heman     CHA2DS2Vasc 2 for female, HTN, on Eliquis, appropriately dosed      No new symptoms off amiodarone      We discussed consideration to stop a/c, her CHA2DS2Vasc score, Dr. Jackalyn Lombard thoughts last time he saw her.  She is very uncomfortable with the idea of stopping her eliquis.  She has done well witbh it, understands her score.  She reports lengthy discussion with her PMD as well and does not want her Eliquis stopped.  She is aked to request her labs from the PMD be sent to [place] as well.  2. HTN     No changes today, looks good  3. Snoring      Reports resolved with addition of O2 for her COPD   Disposition: F/u with me in 6 mo, sooner if needed  Current medicines are reviewed at length with the patient today.  The patient did not have any concerns regarding medicines.  Venetia Night, PA-C 06/06/2019 11:47 AM     [facility] [place] [address] [phone] (office)  [phone] (fax)

## 2019-06-06 ENCOUNTER — Ambulatory Visit (INDEPENDENT_AMBULATORY_CARE_PROVIDER_SITE_OTHER): Payer: Medicare HMO | Admitting: Physician Assistant

## 2019-06-06 ENCOUNTER — Other Ambulatory Visit: Payer: Self-pay

## 2019-06-06 ENCOUNTER — Encounter: Payer: Self-pay | Admitting: Physician Assistant

## 2019-06-06 VITALS — BP 122/74 | HR 73 | Ht 66.0 in | Wt 212.0 lb

## 2019-06-06 DIAGNOSIS — I4819 Other persistent atrial fibrillation: Secondary | ICD-10-CM

## 2019-06-06 DIAGNOSIS — I10 Essential (primary) hypertension: Secondary | ICD-10-CM

## 2019-06-06 DIAGNOSIS — Z79899 Other long term (current) drug therapy: Secondary | ICD-10-CM | POA: Diagnosis not present

## 2019-06-06 NOTE — Patient Instructions (Signed)
Medication Instructions:  Your physician recommends that you continue on your current medications as directed. Please refer to the Current Medication list given to you today.  If you need a refill on your cardiac medications before your next appointment, please call your pharmacy.   Lab work: NONE ORDERED  TODAY     If you have labs (blood work) drawn today and your tests are completely normal, you will receive your results only by: Marland Kitchen MyChart Message (if you have MyChart) OR . A paper copy in the mail If you have any lab test that is abnormal or we need to change your treatment, we will call you to review the results.  Testing/Procedures: NONE ORDERED  TODAY   Follow-Up: At [facility], you and your health needs are our priority.  As part of our continuing mission to provide you with exceptional heart care, we have created designated Provider Care Teams.  These Care Teams include your primary Cardiologist (physician) and Advanced Practice Providers (APPs -  Physician Assistants and Nurse Practitioners) who all work together to provide you with the care you need, when you need it. . You will need a follow up appointment in 6 months.  Please call our office 2 months in advance to schedule this appointment. Tommye Standard, PA-C  Any Other Special Instructions Will Be Listed Below (If Applicable).

## 2019-06-10 DIAGNOSIS — J449 Chronic obstructive pulmonary disease, unspecified: Secondary | ICD-10-CM | POA: Diagnosis not present

## 2019-06-13 ENCOUNTER — Other Ambulatory Visit: Payer: Self-pay | Admitting: Physician Assistant

## 2019-06-17 ENCOUNTER — Ambulatory Visit (INDEPENDENT_AMBULATORY_CARE_PROVIDER_SITE_OTHER): Payer: Medicare HMO | Admitting: *Deleted

## 2019-06-17 ENCOUNTER — Other Ambulatory Visit: Payer: Self-pay

## 2019-06-17 DIAGNOSIS — Z0000 Encounter for general adult medical examination without abnormal findings: Secondary | ICD-10-CM

## 2019-06-17 NOTE — Progress Notes (Addendum)
Marland KitchenMarland Kitchen....     MEDICARE ANNUAL WELLNESS VISIT  06/17/2019  Telephone Visit Disclaimer This Medicare AWV was conducted by telephone due to national recommendations for restrictions regarding the COVID-19 Pandemic (e.g. social distancing).  I verified, using two identifiers, that I am speaking with [name] or their authorized healthcare agent. I discussed the limitations, risks, security, and privacy concerns of performing an evaluation and management service by telephone and the potential availability of an in-person appointment in the future. The patient expressed understanding and agreed to proceed.   Subjective:  [name] is a 68 y.o. female patient of Marie Grayer, MD who had a Medicare Annual Wellness Visit today via telephone. [name] is Retired and lives with their spouse. she has 1 children. she reports that she is socially active and does interact with friends/family regularly. she is minimally physically active and enjoys crafting and shopping.  Patient Care Team: Marie Grayer, MD as PCP - General (Cardiology) Tanda Rockers, MD as Consulting Physician (Pulmonary Disease) Herminio Commons, MD as Attending Physician (Cardiology) Ilean China, RN as Registered Nurse  Advanced Directives 06/17/2019 06/17/2019 09/28/2018 06/14/2018 05/04/2018 05/03/2018 10/08/2017  Does Patient Have a Medical Advance Directive? No No No No No No No  Would patient like information on creating a medical advance directive? Yes (MAU/Ambulatory/Procedural Areas - Information given) - - No - Patient declined No - Patient declined No - Patient declined No - Patient declined  Pre-existing out of facility DNR order (yellow form or pink MOST form) - - - - - - -    Hospital Utilization Over the Past 12 Months: # of hospitalizations or ER visits: 0 # of surgeries: 0  Review of Systems    Patient reports that her overall health is worse compared to last year.  Patient Reported Readings (BP,  Pulse, CBG, Weight, etc) none  Review of Systems: History obtained from chart review  All other systems negative.  Pain Assessment       Current Medications & Allergies (verified) Allergies as of 06/17/2019      Reactions   Penicillins Rash   Has patient had a PCN reaction causing immediate rash, facial/tongue/throat swelling, SOB or lightheadedness with hypotension: Yes Has patient had a PCN reaction causing severe rash involving mucus membranes or skin necrosis: Yes Has patient had a PCN reaction that required hospitalization: No Has patient had a PCN reaction occurring within the last 10 years: No If all of the above answers are "NO", then may proceed with Cephalosporin use.   Dilaudid [hydromorphone Hcl]       Medication List       Accurate as of June 17, 2019  2:58 PM. If you have any questions, ask your nurse or doctor.        albuterol 108 (90 Base) MCG/ACT inhaler Commonly known as: ProAir HFA Inhale 2 puffs into the lungs every 6 (six) hours as needed for wheezing or shortness of breath.   albuterol (2.5 MG/3ML) 0.083% nebulizer solution Commonly known as: PROVENTIL Take 3 mLs (2.5 mg total) by nebulization every 6 (six) hours as needed for wheezing or shortness of breath.   CRANBERRY PO Take 325 mg by mouth daily.   docusate sodium 100 MG capsule Commonly known as: COLACE Take 100 mg by mouth daily as needed for mild constipation.   Eliquis 5 MG Tabs tablet Generic drug: apixaban TAKE (1) TABLET TWICE A DAY.   Fluticasone-Umeclidin-Vilant 100-62.5-25 MCG/INH Aepb Commonly known as: Trelegy Ellipta Inhale  1 Units into the lungs daily.   guaiFENesin 600 MG 12 hr tablet Commonly known as: MUCINEX Take 600 mg by mouth every morning.   metoprolol tartrate 25 MG tablet Commonly known as: LOPRESSOR Take 1 tablet (25 mg total) by mouth 2 (two) times daily.   montelukast 10 MG tablet Commonly known as: SINGULAIR Take 1 tablet (10 mg total) by mouth  at bedtime.   pantoprazole 20 MG tablet Commonly known as: PROTONIX Take 1 tablet (20 mg total) by mouth daily.   tolterodine 2 MG tablet Commonly known as: DETROL Take 1 tablet (2 mg total) by mouth 2 (two) times daily.   Vascepa 1 g Caps Generic drug: Icosapent Ethyl TAKE (2) CAPSULES TWICE DAILY.   Vitamin D3 125 MCG (5000 UT) Caps Take 5,000 Units by mouth daily.       History (reviewed): Past Medical History:  Diagnosis Date  . COPD (chronic obstructive pulmonary disease) ([place])   . GERD (gastroesophageal reflux disease)   . Hypercholesteremia   . Mild mitral regurgitation   . Mitral valve prolapse    a. dx 1985, not seen on echoes more recently.  . Obesity   . Paroxysmal atrial fibrillation ([facility])   . Paroxysmal atrial flutter ([place])    a. dx 05/2017.   Past Surgical History:  Procedure Laterality Date  . ABDOMINAL HYSTERECTOMY    . ATRIAL FIBRILLATION ABLATION N/A 05/04/2018   Procedure: ATRIAL FIBRILLATION ABLATION;  Surgeon: Marie Grayer, MD;  Location: [facility];  Service: Cardiovascular;  Laterality: N/A;  . CARDIOVERSION N/A 10/08/2017   Procedure: CARDIOVERSION;  Surgeon: Herminio Commons, MD;  [facility];  Service: Cardiovascular;  Laterality: N/A;  . CESAREAN SECTION    . TEE WITHOUT CARDIOVERSION N/A 05/03/2018   Procedure: TRANSESOPHAGEAL ECHOCARDIOGRAM (TEE);  Surgeon: Fay Records, MD;  Location: [facility];  Service: Cardiovascular;  Laterality: N/A;  . TUBAL LIGATION     Family History  Problem Relation Age of Onset  . Heart attack Mother 13  . Asthma Mother   . Emphysema Father        smoked  . Heart disease Father   . Atrial fibrillation Brother   . Hypertension Son    Social History   Socioeconomic History  . Marital status: Married    Spouse name: Not on file  . Number of children: 1  . Years of education: 11  . Highest education level: 12th grade  Occupational History  . 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 Occupation: [employer]     Comment: retired  Scientific laboratory technician  . Financial resource strain: Not hard at all  . Food insecurity    Worry: Never true    Inability: Never true  . Transportation needs    Medical: No    Non-medical: No  Tobacco Use  . Smoking status: Former Smoker    Packs/day: 1.00    Years: 20.00    Pack years: 20.00    Types: Cigarettes    Quit date: 08/14/1990    Years since quitting: 28.8  . Smokeless tobacco: Never Used  Substance and Sexual Activity  . Alcohol use: No    Alcohol/week: 0.0 standard drinks  . Drug use: No  . Sexual activity: Not Currently  Lifestyle  . Physical activity    Days per week: 0 days    Minutes per session: 0 min  . Stress: Not at all  Relationships  . Social connections    Talks on phone: More than three times a week    Gets together: Twice a week    Attends religious service: More than 4 times per year    Active member of club or organization: Yes    Attends meetings of clubs or organizations: 1 to 4 times per year    Relationship status: Married  Other Topics Concern  . Not on file  Social History Narrative  . Not on file    Activities of Daily Living In your present state of health, do you have any difficulty performing the following activities: 06/17/2019  Hearing? N  Vision? N  Difficulty concentrating or making decisions? N  Walking or climbing stairs? N  Dressing or bathing? N  Doing errands, shopping? N  Some recent data might be hidden    Patient Education/ Literacy    Exercise    Diet Patient reports consuming 3 meals a day and 2 snack(s) a day Patient reports that her primary diet is: Low Sodium Patient reports that she does have regular access to food.   Depression Screen PHQ 2/9 Scores 06/17/2019 06/17/2019 05/25/2019 04/15/2019 12/27/2018 12/08/2018 10/20/2018  PHQ - 2 Score 0 0 0 0 0 0 0     Fall Risk Fall Risk  06/17/2019 06/17/2019 05/25/2019 04/15/2019 12/27/2018   Falls in the past year? 0 0 0 0 1  Number falls in past yr: - - 0 - 0  Injury with Fall? - - 0 - 0  Follow up - - Falls prevention discussed - -     Objective:  [name] seemed alert and oriented and she participated appropriately during our telephone visit.  Blood Pressure Weight BMI  BP Readings from Last 3 Encounters:  06/06/19 122/74  04/15/19 (!) 141/83  12/27/18 129/70   Wt Readings from Last 3 Encounters:  06/06/19 212 lb (96.2 kg)  04/15/19 214 lb (97.1 kg)  12/27/18 214 lb 3.2 oz (97.2 kg)   BMI Readings from Last 1 Encounters:  06/06/19 34.22 kg/m    *Unable to obtain current vital signs, weight, and BMI due to telephone visit type  Hearing/Vision  . [place] did not seem to have difficulty with hearing/understanding during the telephone conversation . Reports that she has not had a formal eye exam by an eye care professional within the past year . Reports that she has not had a formal hearing evaluation within the past year *Unable to fully assess hearing and vision during telephone visit type  Cognitive Function: 6CIT Screen 06/17/2019  What Year? 0 points  What month? 0 points  What time? 0 points  Count back from 20 0 points  Months in reverse 0 points  Repeat phrase 0 points  Total Score 0   (Normal:0-7, Significant for Dysfunction: >8)  Normal Cognitive Function Screening: Yes   Immunization & Health Maintenance Record Immunization History  Administered Date(s) Administered  . Influenza Split 08/03/2013  . Influenza,inj,Quad PF,6+ Mos 08/26/2016, 08/12/2017  . Influenza-Unspecified 08/12/2017, 08/11/2018  . Pneumococcal Conjugate-13 11/04/2007    Health Maintenance  Topic Date Due  . Hepatitis C Screening  [birth year]  . TETANUS/TDAP  12/11/1967  . PNA vac Low Risk Adult (2 of 2 - PPSV23) 12/10/2013  . INFLUENZA VACCINE  06/04/2019  . COLONOSCOPY  10/02/2019  . MAMMOGRAM  12/01/2020  . DEXA SCAN  Completed       Assessment  This  is a routine wellness examination for Adelheid Hoggard  [place].  Health Maintenance: Due or Overdue Health Maintenance Due  Topic Date Due  . Hepatitis C Screening  24-Nov-1950  . TETANUS/TDAP  12/11/1967  . PNA vac Low Risk Adult (2 of 2 - PPSV23) 12/10/2013  . INFLUENZA VACCINE  06/04/2019    [name] does not need a referral for Community Assistance: Care Management:   no Social Work:    no Prescription Assistance:  no Nutrition/Diabetes Education:  no   Plan:  Personalized Goals Goals Addressed            This Visit's Progress   . DIET - EAT MORE FRUITS AND VEGETABLES       Eat a better diet with less fried foods and more fresh vegetablesl    . DIET - REDUCE CALORIE INTAKE       Pt is going to cut back on snacks, especially potato chips      Personalized Health Maintenance & Screening Recommendations  Advanced directives: has NO advanced directive  - add't info requested. Referral to SW: no  Lung Cancer Screening Recommended: no (Low Dose CT Chest recommended if Age 61-80 years, 30 pack-year currently smoking OR have quit w/in past 15 years) Hepatitis C Screening recommended: no HIV Screening recommended: no  Advanced Directives: Written information was prepared per patient's request.  Referrals & Orders No orders of the defined types were placed in this encounter.   Follow-up Plan . Follow-up with Marie Grayer, MD as planned . Schedule . Pt will pick up Advanced Directives packet from our office next week. Tdap to be given at next visit. Pt know she had a pneumonia 23 vaccine before moving to our office, not sure if it was before or after turning 63.   I have personally reviewed and noted the following in the patient's chart:   . Medical and social history . Use of alcohol, tobacco or illicit drugs  . Current medications and supplements . Functional ability and status . Nutritional status . Physical activity . Advanced directives . List of other  physicians . Hospitalizations, surgeries, and ER visits in previous 12 months . Vitals . Screenings to include cognitive, depression, and falls . Referrals and appointments  In addition, I have reviewed and discussed with [name] certain preventive protocols, quality metrics, and best practice recommendations. A written personalized care plan for preventive services as well as general preventive health recommendations is available and can be mailed to the patient at her request.      Rana Snare, LPN  6/50/[id number]    I have reviewed and agree with the above AWV documentation.   Evelina Dun, FNP

## 2019-06-20 ENCOUNTER — Encounter: Payer: Self-pay | Admitting: Physician Assistant

## 2019-06-20 DIAGNOSIS — E7800 Pure hypercholesterolemia, unspecified: Secondary | ICD-10-CM | POA: Diagnosis not present

## 2019-06-20 DIAGNOSIS — R6889 Other general symptoms and signs: Secondary | ICD-10-CM | POA: Diagnosis not present

## 2019-06-20 DIAGNOSIS — Z0000 Encounter for general adult medical examination without abnormal findings: Secondary | ICD-10-CM | POA: Diagnosis not present

## 2019-06-21 DIAGNOSIS — Z0289 Encounter for other administrative examinations: Secondary | ICD-10-CM

## 2019-06-22 LAB — CBC WITH DIFFERENTIAL/PLATELET
Basophils Absolute: 0.1 10*3/uL (ref 0.0–0.2)
Basos: 1 %
EOS (ABSOLUTE): 0.2 10*3/uL (ref 0.0–0.4)
Eos: 2 %
Hematocrit: 37.9 % (ref 34.0–46.6)
Hemoglobin: 12.6 g/dL (ref 11.1–15.9)
Immature Grans (Abs): 0 10*3/uL (ref 0.0–0.1)
Immature Granulocytes: 1 %
Lymphocytes Absolute: 1.5 10*3/uL (ref 0.7–3.1)
Lymphs: 23 %
MCH: 28.7 pg (ref 26.6–33.0)
MCHC: 33.2 g/dL (ref 31.5–35.7)
MCV: 86 fL (ref 79–97)
Monocytes Absolute: 0.3 10*3/uL (ref 0.1–0.9)
Monocytes: 5 %
Neutrophils Absolute: 4.5 10*3/uL (ref 1.4–7.0)
Neutrophils: 68 %
Platelets: 365 10*3/uL (ref 150–450)
RBC: 4.39 x10E6/uL (ref 3.77–5.28)
RDW: 13.1 % (ref 11.7–15.4)
WBC: 6.5 10*3/uL (ref 3.4–10.8)

## 2019-06-22 LAB — CMP14+EGFR
ALT: 9 IU/L (ref 0–32)
AST: 15 IU/L (ref 0–40)
Albumin/Globulin Ratio: 1.4 (ref 1.2–2.2)
Albumin: 3.9 g/dL (ref 3.8–4.8)
Alkaline Phosphatase: 102 IU/L (ref 39–117)
BUN/Creatinine Ratio: 9 — ABNORMAL LOW (ref 12–28)
BUN: 8 mg/dL (ref 8–27)
Bilirubin Total: 1.1 mg/dL (ref 0.0–1.2)
CO2: 26 mmol/L (ref 20–29)
Calcium: 9.4 mg/dL (ref 8.7–10.3)
Chloride: 101 mmol/L (ref 96–106)
Creatinine, Ser: 0.89 mg/dL (ref 0.57–1.00)
GFR calc Af Amer: 76 mL/min/{1.73_m2} (ref >59)
GFR calc non Af Amer: 66 mL/min/{1.73_m2} (ref >59)
Globulin, Total: 2.8 g/dL (ref 1.5–4.5)
Glucose: 96 mg/dL (ref 65–99)
Potassium: 4.3 mmol/L (ref 3.5–5.2)
Sodium: 143 mmol/L (ref 134–144)
Total Protein: 6.7 g/dL (ref 6.0–8.5)

## 2019-06-22 LAB — THYROID PANEL WITH TSH
Free Thyroxine Index: 2.4 (ref 1.2–4.9)
T3 Uptake Ratio: 26 % (ref 24–39)
T4, Total: 9.3 ug/dL (ref 4.5–12.0)
TSH: 2.39 u[IU]/mL (ref 0.450–4.500)

## 2019-06-22 LAB — LIPID PANEL
Chol/HDL Ratio: 5.4 ratio — ABNORMAL HIGH (ref 0.0–4.4)
Cholesterol, Total: 214 mg/dL — ABNORMAL HIGH (ref 100–199)
HDL: 40 mg/dL (ref >39)
LDL Calculated: 144 mg/dL — ABNORMAL HIGH (ref 0–99)
Triglycerides: 149 mg/dL (ref 0–149)
VLDL Cholesterol Cal: 30 mg/dL (ref 5–40)

## 2019-06-23 ENCOUNTER — Telehealth: Payer: Self-pay | Admitting: Physician Assistant

## 2019-06-28 ENCOUNTER — Other Ambulatory Visit: Payer: Self-pay | Admitting: Physician Assistant

## 2019-06-28 ENCOUNTER — Encounter: Payer: Self-pay | Admitting: Physician Assistant

## 2019-06-28 MED ORDER — DOXYCYCLINE HYCLATE 100 MG PO TABS: 100.0000 mg | ORAL_TABLET | Freq: Two times a day (BID) | ORAL | 0 refills | Status: DC

## 2019-07-11 DIAGNOSIS — J449 Chronic obstructive pulmonary disease, unspecified: Secondary | ICD-10-CM | POA: Diagnosis not present

## 2019-07-14 ENCOUNTER — Other Ambulatory Visit: Payer: Self-pay | Admitting: Physician Assistant

## 2019-07-27 ENCOUNTER — Encounter: Payer: Self-pay | Admitting: Physician Assistant

## 2019-08-08 ENCOUNTER — Other Ambulatory Visit: Payer: Self-pay | Admitting: Physician Assistant

## 2019-08-10 DIAGNOSIS — J449 Chronic obstructive pulmonary disease, unspecified: Secondary | ICD-10-CM | POA: Diagnosis not present

## 2019-08-18 ENCOUNTER — Encounter: Payer: Self-pay | Admitting: Physician Assistant

## 2019-08-18 ENCOUNTER — Encounter: Payer: Self-pay | Admitting: Gastroenterology

## 2019-08-26 ENCOUNTER — Other Ambulatory Visit: Payer: Self-pay | Admitting: Physician Assistant

## 2019-08-30 ENCOUNTER — Encounter: Payer: Self-pay | Admitting: Physician Assistant

## 2019-09-07 ENCOUNTER — Ambulatory Visit: Payer: Medicare HMO | Admitting: Physician Assistant

## 2019-09-07 ENCOUNTER — Ambulatory Visit (INDEPENDENT_AMBULATORY_CARE_PROVIDER_SITE_OTHER): Payer: Medicare HMO | Admitting: Physician Assistant

## 2019-09-07 ENCOUNTER — Encounter: Payer: Self-pay | Admitting: Physician Assistant

## 2019-09-07 DIAGNOSIS — N3000 Acute cystitis without hematuria: Secondary | ICD-10-CM | POA: Diagnosis not present

## 2019-09-07 LAB — MICROSCOPIC EXAMINATION: Renal Epithel, UA: NONE SEEN /hpf

## 2019-09-07 LAB — URINALYSIS, COMPLETE
Bilirubin, UA: NEGATIVE
Glucose, UA: NEGATIVE
Ketones, UA: NEGATIVE
Leukocytes,UA: NEGATIVE
Nitrite, UA: NEGATIVE
Protein,UA: NEGATIVE
Specific Gravity, UA: 1.02 (ref 1.005–1.030)
Urobilinogen, Ur: 0.2 mg/dL (ref 0.2–1.0)
pH, UA: 6.5 (ref 5.0–7.5)

## 2019-09-07 MED ORDER — DOXYCYCLINE HYCLATE 100 MG PO TABS: 100.0000 mg | ORAL_TABLET | Freq: Two times a day (BID) | ORAL | 4 refills | Status: DC

## 2019-09-07 NOTE — Progress Notes (Signed)
Telephone visit  Subjective: CC:uti PCP: Terald Sleeper, PA-C [id number] [name] is a 68 y.o. female calls for telephone consult today. Patient provides verbal consent for consult held via phone.  Patient is identified with 2 separate identifiers.  At this time the entire area is on COVID-19 social distancing and stay home orders are in place.  Patient is of higher risk and therefore we are performing this by a virtual method.  Location of patient: home Location of provider: HOME Others present for call: no  This patient has had several days of dysuria, frequency and nocturia. There is also pain over the bladder in the suprapubic region, no back pain. Denies leakage or hematuria.  Denies fever or chills. No pain in flank area.    ROS: Per HPI  Allergies  Allergen Reactions  . Penicillins Rash    Has patient had a PCN reaction causing immediate rash, facial/tongue/throat swelling, SOB or lightheadedness with hypotension: Yes Has patient had a PCN reaction causing severe rash involving mucus membranes or skin necrosis: Yes Has patient had a PCN reaction that required hospitalization: No Has patient had a PCN reaction occurring within the last 10 years: No If all of the above answers are "NO", then may proceed with Cephalosporin use.   Marland Kitchen Dilaudid [Hydromorphone Hcl]    Past Medical History:  Diagnosis Date  . COPD (chronic obstructive pulmonary disease) ([place])   . GERD (gastroesophageal reflux disease)   . Hypercholesteremia   . Mild mitral regurgitation   . Mitral valve prolapse    a. dx 1985, not seen on echoes more recently.  . Obesity   . Paroxysmal atrial fibrillation ([facility])   . Paroxysmal atrial flutter ([place])    a. dx 05/2017.    Current Outpatient Medications:  .  albuterol (PROAIR HFA) 108 (90 Base) MCG/ACT inhaler, Inhale 2 puffs into the lungs every 6 (six) hours as needed for wheezing or shortness of breath., Disp: 1 Inhaler, Rfl: 11 .  albuterol (PROVENTIL)  (2.5 MG/3ML) 0.083% nebulizer solution, Take 3 mLs (2.5 mg total) by nebulization every 6 (six) hours as needed for wheezing or shortness of breath., Disp: 240 mL, Rfl: 1 .  Cholecalciferol (VITAMIN D3) 5000 UNITS CAPS, Take 5,000 Units by mouth daily. , Disp: , Rfl:  .  CRANBERRY PO, Take 325 mg by mouth daily. , Disp: , Rfl:  .  docusate sodium (COLACE) 100 MG capsule, Take 100 mg by mouth daily as needed for mild constipation., Disp: , Rfl:  .  doxycycline (VIBRA-TABS) 100 MG tablet, Take 1 tablet (100 mg total) by mouth 2 (two) times daily. 1 po bid, Disp: 20 tablet, Rfl: 4 .  ELIQUIS 5 MG TABS tablet, TAKE (1) TABLET TWICE A DAY., Disp: 60 tablet, Rfl: 5 .  Fluticasone-Umeclidin-Vilant (TRELEGY ELLIPTA) 100-62.5-25 MCG/INH AEPB, Inhale 1 Units into the lungs daily., Disp: 60 each, Rfl: 11 .  guaiFENesin (MUCINEX) 600 MG 12 hr tablet, Take 600 mg by mouth every morning. , Disp: , Rfl:  .  metoprolol tartrate (LOPRESSOR) 25 MG tablet, Take 1 tablet (25 mg total) by mouth 2 (two) times daily., Disp: 180 tablet, Rfl: 3 .  montelukast (SINGULAIR) 10 MG tablet, Take 1 tablet (10 mg total) by mouth at bedtime., Disp: 90 tablet, Rfl: 3 .  pantoprazole (PROTONIX) 20 MG tablet, Take 1 tablet (20 mg total) by mouth daily., Disp: 90 tablet, Rfl: 0 .  tolterodine (DETROL) 2 MG tablet, Take 1 tablet (2 mg total) by  mouth 2 (two) times daily., Disp: 60 tablet, Rfl: 6 .  VASCEPA 1 g CAPS, TAKE (2) CAPSULES TWICE DAILY., Disp: 120 capsule, Rfl: 5  Assessment/ Plan: 68 y.o. female   1. Acute cystitis without hematuria - Urine Culture - Urinalysis, Complete - doxycycline (VIBRA-TABS) 100 MG tablet; Take 1 tablet (100 mg total) by mouth 2 (two) times daily. 1 po bid  Dispense: 20 tablet; Refill: 4    No follow-ups on file.  Continue all other maintenance medications as listed above.  Start time: 11:06 AM End time: 11:13 AM  Meds ordered this encounter  Medications  . doxycycline (VIBRA-TABS) 100 MG  tablet    Sig: Take 1 tablet (100 mg total) by mouth 2 (two) times daily. 1 po bid    Dispense:  20 tablet    Refill:  4    Order Specific Question:   Supervising Provider    Answer:   Janora Norlander [id number]    Particia Nearing PA-C [place] [phone]

## 2019-09-08 LAB — URINE CULTURE: Organism ID, Bacteria: NO GROWTH

## 2019-09-10 DIAGNOSIS — J449 Chronic obstructive pulmonary disease, unspecified: Secondary | ICD-10-CM | POA: Diagnosis not present

## 2019-09-14 ENCOUNTER — Ambulatory Visit (INDEPENDENT_AMBULATORY_CARE_PROVIDER_SITE_OTHER): Payer: Medicare HMO | Admitting: Physician Assistant

## 2019-09-14 DIAGNOSIS — B029 Zoster without complications: Secondary | ICD-10-CM

## 2019-09-14 MED ORDER — VALACYCLOVIR HCL 1 G PO TABS: 1000.0000 mg | ORAL_TABLET | Freq: Two times a day (BID) | ORAL | 0 refills | Status: DC

## 2019-09-14 MED ORDER — PANTOPRAZOLE SODIUM 40 MG PO TBEC: 40.0000 mg | DELAYED_RELEASE_TABLET | Freq: Every day | ORAL | 3 refills | Status: DC

## 2019-09-15 ENCOUNTER — Encounter: Payer: Self-pay | Admitting: Physician Assistant

## 2019-09-15 NOTE — Progress Notes (Signed)
Telephone visit  Subjective: [id number] breaking out PCP: Terald Sleeper, PA-C [id number] [name] is a 68 y.o. female calls for telephone consult today. Patient provides verbal consent for consult held via phone.  Patient is identified with 2 separate identifiers.  At this time the entire area is on COVID-19 social distancing and stay home orders are in place.  Patient is of higher risk and therefore we are performing this by a virtual method.  Location of patient: home Location of provider: [facility] Others present for call: no  The patient has had shingles before.  In the past it took a while for him to erupt.  So for the past 3 days she has had the same area with burning and pain in a linear fashion.  She has 1 little spot that is broken out.  She would like to get started back on the antiviral medicine now. The patient also has GERD and does need a refill on her pantoprazole.  She is doing fairly well on it.  And would like to continue.   ROS: Per HPI  Allergies  Allergen Reactions  . Penicillins Rash    Has patient had a PCN reaction causing immediate rash, facial/tongue/throat swelling, SOB or lightheadedness with hypotension: Yes Has patient had a PCN reaction causing severe rash involving mucus membranes or skin necrosis: Yes Has patient had a PCN reaction that required hospitalization: No Has patient had a PCN reaction occurring within the last 10 years: No If all of the above answers are "NO", then may proceed with Cephalosporin use.   Marland Kitchen Dilaudid [Hydromorphone Hcl]    Past Medical History:  Diagnosis Date  . COPD (chronic obstructive pulmonary disease) ([place])   . GERD (gastroesophageal reflux disease)   . Hypercholesteremia   . Mild mitral regurgitation   . Mitral valve prolapse    a. dx 1985, not seen on echoes more recently.  . Obesity   . Paroxysmal atrial fibrillation ([facility])   . Paroxysmal atrial flutter ([place])    a. dx 05/2017.    Current Outpatient  Medications:  .  albuterol (PROAIR HFA) 108 (90 Base) MCG/ACT inhaler, Inhale 2 puffs into the lungs every 6 (six) hours as needed for wheezing or shortness of breath., Disp: 1 Inhaler, Rfl: 11 .  albuterol (PROVENTIL) (2.5 MG/3ML) 0.083% nebulizer solution, Take 3 mLs (2.5 mg total) by nebulization every 6 (six) hours as needed for wheezing or shortness of breath., Disp: 240 mL, Rfl: 1 .  Cholecalciferol (VITAMIN D3) 5000 UNITS CAPS, Take 5,000 Units by mouth daily. , Disp: , Rfl:  .  CRANBERRY PO, Take 325 mg by mouth daily. , Disp: , Rfl:  .  docusate sodium (COLACE) 100 MG capsule, Take 100 mg by mouth daily as needed for mild constipation., Disp: , Rfl:  .  doxycycline (VIBRA-TABS) 100 MG tablet, Take 1 tablet (100 mg total) by mouth 2 (two) times daily. 1 po bid, Disp: 20 tablet, Rfl: 4 .  ELIQUIS 5 MG TABS tablet, TAKE (1) TABLET TWICE A DAY., Disp: 60 tablet, Rfl: 5 .  Fluticasone-Umeclidin-Vilant (TRELEGY ELLIPTA) 100-62.5-25 MCG/INH AEPB, Inhale 1 Units into the lungs daily., Disp: 60 each, Rfl: 11 .  guaiFENesin (MUCINEX) 600 MG 12 hr tablet, Take 600 mg by mouth every morning. , Disp: , Rfl:  .  metoprolol tartrate (LOPRESSOR) 25 MG tablet, Take 1 tablet (25 mg total) by mouth 2 (two) times daily., Disp: 180 tablet, Rfl: 3 .  montelukast (SINGULAIR) 10  MG tablet, Take 1 tablet (10 mg total) by mouth at bedtime., Disp: 90 tablet, Rfl: 3 .  pantoprazole (PROTONIX) 40 MG tablet, Take 1 tablet (40 mg total) by mouth daily., Disp: 90 tablet, Rfl: 3 .  tolterodine (DETROL) 2 MG tablet, Take 1 tablet (2 mg total) by mouth 2 (two) times daily., Disp: 60 tablet, Rfl: 6 .  valACYclovir (VALTREX) 1000 MG tablet, Take 1 tablet (1,000 mg total) by mouth 2 (two) times daily., Disp: 20 tablet, Rfl: 0 .  VASCEPA 1 g CAPS, TAKE (2) CAPSULES TWICE DAILY., Disp: 120 capsule, Rfl: 5  Assessment/ Plan: 68 y.o. female   1. Herpes zoster without complication - valACYclovir (VALTREX) 1000 MG tablet; Take 1  tablet (1,000 mg total) by mouth 2 (two) times daily.  Dispense: 20 tablet; Refill: 0   No follow-ups on file.  Continue all other maintenance medications as listed above.  Start time: 11:06 AM End time: 11:13 AM  Meds ordered this encounter  Medications  . valACYclovir (VALTREX) 1000 MG tablet    Sig: Take 1 tablet (1,000 mg total) by mouth 2 (two) times daily.    Dispense:  20 tablet    Refill:  0    Order Specific Question:   Supervising Provider    Answer:   Janora Norlander [id number]  . pantoprazole (PROTONIX) 40 MG tablet    Sig: Take 1 tablet (40 mg total) by mouth daily.    Dispense:  90 tablet    Refill:  3    Order Specific Question:   Supervising Provider    Answer:   Janora Norlander [id number]    Particia Nearing PA-C [place] [phone]

## 2019-09-21 ENCOUNTER — Other Ambulatory Visit: Payer: Self-pay | Admitting: Physician Assistant

## 2019-09-21 DIAGNOSIS — J449 Chronic obstructive pulmonary disease, unspecified: Secondary | ICD-10-CM

## 2019-09-23 ENCOUNTER — Encounter: Payer: Self-pay | Admitting: Physician Assistant

## 2019-09-23 ENCOUNTER — Other Ambulatory Visit: Payer: Self-pay | Admitting: Physician Assistant

## 2019-09-23 MED ORDER — FLUCONAZOLE 150 MG PO TABS: ORAL_TABLET | ORAL | 0 refills | Status: DC

## 2019-09-26 ENCOUNTER — Ambulatory Visit (INDEPENDENT_AMBULATORY_CARE_PROVIDER_SITE_OTHER): Payer: Medicare HMO | Admitting: Physician Assistant

## 2019-09-26 ENCOUNTER — Encounter: Payer: Self-pay | Admitting: Physician Assistant

## 2019-09-26 DIAGNOSIS — N3281 Overactive bladder: Secondary | ICD-10-CM | POA: Diagnosis not present

## 2019-09-26 MED ORDER — MIRABEGRON ER 25 MG PO TB24: 25.0000 mg | ORAL_TABLET | Freq: Every day | ORAL | 5 refills | Status: DC

## 2019-09-26 MED ORDER — MONTELUKAST SODIUM 10 MG PO TABS: ORAL_TABLET | ORAL | 3 refills | Status: DC

## 2019-09-26 NOTE — Progress Notes (Signed)
Telephone visit  Subjective: CC: Overactive bladder PCP: Terald Sleeper, PA-C [id number] [name] is a 68 y.o. female calls for telephone consult today. Patient provides verbal consent for consult held via phone.  Patient is identified with 2 separate identifiers.  At this time the entire area is on COVID-19 social distancing and stay home orders are in place.  Patient is of higher risk and therefore we are performing this by a virtual method.  Location of patient: Home Location of provider: HOME Others present for call: No  This is for follow-up of the patient's overactive bladder medication.  She had been taking Detrol all 2 mg twice daily and has been doing it for several months and has failed.  She is still having urinary frequency, urge incontinence at times.  She denies have any fever or chills.  She has had a history of urinary tract infections in the past.  I would like for her to have Myrbetriq next as her bladder control medication.  A prescription will be sent and we will have prior authorization performed if needed.   ROS: Per HPI  Allergies  Allergen Reactions  . Penicillins Rash    Has patient had a PCN reaction causing immediate rash, facial/tongue/throat swelling, SOB or lightheadedness with hypotension: Yes Has patient had a PCN reaction causing severe rash involving mucus membranes or skin necrosis: Yes Has patient had a PCN reaction that required hospitalization: No Has patient had a PCN reaction occurring within the last 10 years: No If all of the above answers are "NO", then may proceed with Cephalosporin use.   Marland Kitchen Dilaudid [Hydromorphone Hcl]    Past Medical History:  Diagnosis Date  . COPD (chronic obstructive pulmonary disease) ([place])   . GERD (gastroesophageal reflux disease)   . Hypercholesteremia   . Mild mitral regurgitation   . Mitral valve prolapse    a. dx 1985, not seen on echoes more recently.  . Obesity   . Paroxysmal atrial fibrillation  ([facility])   . Paroxysmal atrial flutter ([place])    a. dx 05/2017.    Current Outpatient Medications:  .  albuterol (PROAIR HFA) 108 (90 Base) MCG/ACT inhaler, Inhale 2 puffs into the lungs every 6 (six) hours as needed for wheezing or shortness of breath., Disp: 1 Inhaler, Rfl: 11 .  albuterol (PROVENTIL) (2.5 MG/3ML) 0.083% nebulizer solution, Take 3 mLs (2.5 mg total) by nebulization every 6 (six) hours as needed for wheezing or shortness of breath., Disp: 240 mL, Rfl: 1 .  Cholecalciferol (VITAMIN D3) 5000 UNITS CAPS, Take 5,000 Units by mouth daily. , Disp: , Rfl:  .  CRANBERRY PO, Take 325 mg by mouth daily. , Disp: , Rfl:  .  docusate sodium (COLACE) 100 MG capsule, Take 100 mg by mouth daily as needed for mild constipation., Disp: , Rfl:  .  doxycycline (VIBRA-TABS) 100 MG tablet, Take 1 tablet (100 mg total) by mouth 2 (two) times daily. 1 po bid, Disp: 20 tablet, Rfl: 4 .  ELIQUIS 5 MG TABS tablet, TAKE (1) TABLET TWICE A DAY., Disp: 60 tablet, Rfl: 5 .  fluconazole (DIFLUCAN) 150 MG tablet, 1 po q week x 4 weeks, Disp: 4 tablet, Rfl: 0 .  guaiFENesin (MUCINEX) 600 MG 12 hr tablet, Take 600 mg by mouth every morning. , Disp: , Rfl:  .  metoprolol tartrate (LOPRESSOR) 25 MG tablet, Take 1 tablet (25 mg total) by mouth 2 (two) times daily., Disp: 180 tablet, Rfl: 3 .  mirabegron ER (MYRBETRIQ) 25 MG TB24 tablet, Take 1 tablet (25 mg total) by mouth daily., Disp: 30 tablet, Rfl: 5 .  montelukast (SINGULAIR) 10 MG tablet, Take 1 tablet (10 mg total) by mouth at bedtime., Disp: 90 tablet, Rfl: 3 .  pantoprazole (PROTONIX) 40 MG tablet, Take 1 tablet (40 mg total) by mouth daily., Disp: 90 tablet, Rfl: 3 .  TRELEGY ELLIPTA 100-62.5-25 MCG/INH AEPB, USE 1 PUFF ONCE DAILY, Disp: 60 each, Rfl: 0 .  valACYclovir (VALTREX) 1000 MG tablet, Take 1 tablet (1,000 mg total) by mouth 2 (two) times daily., Disp: 20 tablet, Rfl: 0 .  VASCEPA 1 g CAPS, TAKE (2) CAPSULES TWICE DAILY., Disp: 120 capsule, Rfl: 5   Assessment/ Plan: 68 y.o. female   1. OAB (overactive bladder) - mirabegron ER (MYRBETRIQ) 25 MG TB24 tablet; Take 1 tablet (25 mg total) by mouth daily.  Dispense: 30 tablet; Refill: 5   No follow-ups on file.  Continue all other maintenance medications as listed above.  Start time: 11:56 AM End time: 12:06 PM  Meds ordered this encounter  Medications  . montelukast (SINGULAIR) 10 MG tablet    Sig: Take 1 tablet (10 mg total) by mouth at bedtime.    Dispense:  90 tablet    Refill:  3    Order Specific Question:   Supervising Provider    Answer:   Janora Norlander [id number]  . mirabegron ER (MYRBETRIQ) 25 MG TB24 tablet    Sig: Take 1 tablet (25 mg total) by mouth daily.    Dispense:  30 tablet    Refill:  5    Order Specific Question:   Supervising Provider    Answer:   Janora Norlander [id number]    Particia Nearing PA-C [facility] [phone]

## 2019-10-10 DIAGNOSIS — J449 Chronic obstructive pulmonary disease, unspecified: Secondary | ICD-10-CM | POA: Diagnosis not present

## 2019-10-20 ENCOUNTER — Other Ambulatory Visit: Payer: Self-pay | Admitting: Physician Assistant

## 2019-10-20 DIAGNOSIS — J449 Chronic obstructive pulmonary disease, unspecified: Secondary | ICD-10-CM

## 2019-11-10 DIAGNOSIS — J449 Chronic obstructive pulmonary disease, unspecified: Secondary | ICD-10-CM | POA: Diagnosis not present

## 2019-11-19 ENCOUNTER — Other Ambulatory Visit: Payer: Self-pay | Admitting: Physician Assistant

## 2019-11-19 DIAGNOSIS — J449 Chronic obstructive pulmonary disease, unspecified: Secondary | ICD-10-CM

## 2019-11-30 DIAGNOSIS — Z1231 Encounter for screening mammogram for malignant neoplasm of breast: Secondary | ICD-10-CM | POA: Diagnosis not present

## 2019-12-11 DIAGNOSIS — J449 Chronic obstructive pulmonary disease, unspecified: Secondary | ICD-10-CM | POA: Diagnosis not present

## 2019-12-11 NOTE — Progress Notes (Signed)
Cardiology Office Note Date:  12/11/2019  Patient ID:  Marie, Orozco 01/30/1951, MRN [id number] PCP:  Terald Sleeper, PA-C  Cardiologist:  Dr. Bronson Ing Electrophysiologist: Dr. Rayann Heman    Chief Complaint:    6 mo planned f/u  History of Present Illness: [name] is a 69 y.o. female with history of COPD (GOLD III), GERD, HLD, persistent Afib and flutter.  She comes today to be seen for Dr. Rayann Heman.  She last saw him Jan 2020, she was doing well, with rare palpitations since her ablation and her amiodarone was stopped.  There was mention of consideration to stop her a/c given new guidelines given her score of 2 includes female gender.  She was pending/not yet had sleep study to evaluate her snoring.  Felt her prior echos of severe MR likely worsened by the AFib with subsequent echos describing her MR as mild.  I saw her Aug 2020, she was doing very well.  She reported since her last visit had been started on n/c O2 for her COPD, most days all day, this being managed by her PMD.  She reported rare palpitations, maybe 10-15 minutes very infrequently, unchanged from before stopping the amiodarone.  No dizziness, near syncope or syncope, no CP She had not pursued sleep study, stated since being started on O2 she is sleeping much better with no reports of ongoing snoring We revisited anticoagulation, her risk score of 2 (including gender), she did not want to stop a/c Planned for 6 mo visits  She is doing well.  Has AFib about once a month, duration always < hr, is fast and makes her feel tired.  Self limited with no clear trigger.  No CP.  She has unchanged baseline SOB with her COPD, tends to be worse in the cold weather, uses her O2 all day and night.  No dizzy spells, near syncope or syncope. She is sedentary, limited byher COPD and bad knees.  She denies any bleeding or signs of bleeding, again, does not want to come off a/c    AF Hx PVI/CTI ablation 05/04/18, Dr.  Rayann Heman AAD Amiodarone, stopped Jan 2020 post ablation/maintaining SR   Past Medical History:  Diagnosis Date  . COPD (chronic obstructive pulmonary disease) ([place])   . GERD (gastroesophageal reflux disease)   . Hypercholesteremia   . Mild mitral regurgitation   . Mitral valve prolapse    a. dx 1985, not seen on echoes more recently.  . Obesity   . Paroxysmal atrial fibrillation ([facility])   . Paroxysmal atrial flutter ([place])    a. dx 05/2017.    Past Surgical History:  Procedure Laterality Date  . ABDOMINAL HYSTERECTOMY    . ATRIAL FIBRILLATION ABLATION N/A 05/04/2018   Procedure: ATRIAL FIBRILLATION ABLATION;  Surgeon: Thompson Grayer, MD;  Location: [facility];  Service: Cardiovascular;  Laterality: N/A;  . CARDIOVERSION N/A 10/08/2017   Procedure: CARDIOVERSION;  Surgeon: Herminio Commons, MD;  Location[facility];  Service: Cardiovascular;  Laterality: N/A;  . CESAREAN SECTION    . TEE WITHOUT CARDIOVERSION N/A 05/03/2018   Procedure: TRANSESOPHAGEAL ECHOCARDIOGRAM (TEE);  Surgeon: Fay Records, MD;  Location: [facility] END[facility]COPY;  Service: Cardiovascular;  Laterality: N/A;  . TUBAL LIGATION      Current Outpatient Medications  Medication Sig Dispense Refill  . albuterol (PROAIR HFA) 108 (90 Base) MCG/ACT inhaler Inhale 2 puffs into the lungs every 6 (six) hours as needed for wheezing or shortness of breath. 1 Inhaler  11  . albuterol (PROVENTIL) (2.5 MG/3ML) 0.083% nebulizer solution Take 3 mLs (2.5 mg total) by nebulization every 6 (six) hours as needed for wheezing or shortness of breath. 240 mL 1  . Cholecalciferol (VITAMIN D3) 5000 UNITS CAPS Take 5,000 Units by mouth daily.     Marland Kitchen CRANBERRY PO Take 325 mg by mouth daily.     Marland Kitchen docusate sodium (COLACE) 100 MG capsule Take 100 mg by mouth daily as needed for mild constipation.    Marland Kitchen doxycycline (VIBRA-TABS) 100 MG tablet Take 1 tablet (100 mg total) by mouth 2 (two) times daily. 1 po bid 20 tablet 4  . ELIQUIS 5 MG TABS tablet  TAKE (1) TABLET TWICE A DAY. 60 tablet 5  . fluconazole (DIFLUCAN) 150 MG tablet 1 po q week x 4 weeks 4 tablet 0  . guaiFENesin (MUCINEX) 600 MG 12 hr tablet Take 600 mg by mouth every morning.     . metoprolol tartrate (LOPRESSOR) 25 MG tablet Take 1 tablet (25 mg total) by mouth 2 (two) times daily. 180 tablet 3  . mirabegron ER (MYRBETRIQ) 25 MG TB24 tablet Take 1 tablet (25 mg total) by mouth daily. 30 tablet 5  . montelukast (SINGULAIR) 10 MG tablet Take 1 tablet (10 mg total) by mouth at bedtime. 90 tablet 3  . pantoprazole (PROTONIX) 20 MG tablet Take 1 tablet (20 mg total) by mouth daily. (Needs to be seen before next refill) 30 tablet 0  . pantoprazole (PROTONIX) 40 MG tablet Take 1 tablet (40 mg total) by mouth daily. 90 tablet 3  . TRELEGY ELLIPTA 100-62.5-25 MCG/INH AEPB USE 1 PUFF ONCE DAILY 60 each 0  . valACYclovir (VALTREX) 1000 MG tablet Take 1 tablet (1,000 mg total) by mouth 2 (two) times daily. 20 tablet 0  . VASCEPA 1 g CAPS TAKE (2) CAPSULES TWICE DAILY. 120 capsule 5   No current facility-administered medications for this visit.    Allergies:   Penicillins and Dilaudid [hydromorphone hcl]   Social History:  The patient  reports that she quit smoking about 29 years ago. Her smoking use included cigarettes. She has a 20.00 pack-year smoking history. She has never used smokeless tobacco. She reports that she does not drink alcohol or use drugs.   Family History:  The patient's family history includes Asthma in her mother; Atrial fibrillation in her brother; Emphysema in her father; Heart attack (age of onset: 76) in her mother; Heart disease in her father; Hypertension in her son.  ROS:  Please see the history of present illness.  All other systems are reviewed and otherwise negative.   PHYSICAL EXAM:  VS:  There were no vitals taken for this visit. BMI: There is no height or weight on file to calculate BMI. Well nourished, well developed, in no acute distress  HEENT:  normocephalic, atraumatic  Neck: no JVD, carotid bruits or masses Cardiac:  RRR; no significant murmurs, no rubs, or gallops Lungs:  Soft scattered rhonchi b/l clear with cough, not wheezing, no rales  Abd: soft, nontender, obese MS: no deformity, age appropriate atrophy Ext: no edema  Skin: warm and dry, no rash Neuro:  No gross deficits appreciated Psych: euthymic mood, full affect     EKG:  Done today and reviewed by myself: SR 75bpm, PACs   11/05/2018: TTE Study Conclusions - Left ventricle: The cavity size was normal. There was mild   concentric hypertrophy. Systolic function was normal. The   estimated ejection fraction was in the  range of 55% to 60%. Wall   motion was normal; there were no regional wall motion   abnormalities. Features are consistent with a pseudonormal left   ventricular filling pattern, with concomitant abnormal relaxation   and increased filling pressure (grade 2 diastolic dysfunction).   Doppler parameters are consistent with indeterminate ventricular   filling pressure. - Aortic valve: Transvalvular velocity was within the normal range.   There was no stenosis. There was no regurgitation. - Mitral valve: Transvalvular velocity was within the normal range.   There was no evidence for stenosis. There was mild regurgitation. - Right ventricle: The cavity size was normal. Wall thickness was   normal. Systolic function was normal. - Atrial septum: No defect or patent foramen ovale was identified. - Tricuspid valve: There was trivial regurgitation. - Pulmonary arteries: Systolic pressure was within the normal   range. PA peak pressure: 27 mm Hg (S).   05/04/18: EPS/Ablation CONCLUSIONS: 1. Counter clockwise isthmus dependant right atrial flutter upon presentation.   2. Intracardiac echo reveals a moderate sized left atrium with four separate pulmonary veins without evidence of pulmonary vein stenosis. 3. Successful electrical isolation and anatomical  encircling of all four pulmonary veins with radiofrequency current.    4. Cavo-tricuspid isthmus ablation was performed with complete bidirectional isthmus block achieved.  5. No inducible arrhythmias following ablation both on and off of Isuprel 6. No early apparent complications.   Recent Labs: 06/20/2019: ALT 9; BUN 8; Creatinine, Ser 0.89; Hemoglobin 12.6; Platelets 365; Potassium 4.3; Sodium 143; TSH 2.390  06/20/2019: Chol/HDL Ratio 5.4; Cholesterol, Total 214; HDL 40; LDL Calculated 144; Triglycerides 149   CrCl cannot be calculated (Patient's most recent lab result is older than the maximum 21 days allowed.).   Wt Readings from Last 3 Encounters:  06/06/19 212 lb (96.2 kg)  04/15/19 214 lb (97.1 kg)  12/27/18 214 lb 3.2 oz (97.2 kg)     Other studies reviewed: Additional studies/records reviewed today include: summarized above  ASSESSMENT AND PLAN:  1. Persistent Afib, and flutter     S/p PVI and CTI ablation with Dr. Rayann Heman     CHA2DS2Vasc 2 for female, HTN, on Eliquis, appropriately dosed, patient elected to continue a/c     Update her labs today      She is happy with her current therapy.  States the frequency for her is very tolerable and never last over an hour in duration She is not interested in adjusting her therapy   2. Diastolic dysfunction     No exam findings to suggest volume OL   3. COVID vaccine     She inquires about vaccine, we discussed vaccine, answered her questions     She will continue to think about it  4. COPD     Managed with her PMD team   Disposition: we will continue to see her Q 6 months, sooner if needed   Current medicines are reviewed at length with the patient today.  The patient did not have any concerns regarding medicines.  Venetia Night, PA-C 12/11/2019 4:24 PM     [place] [id number] [phone] (office)  [phone] (fax)

## 2019-12-12 ENCOUNTER — Other Ambulatory Visit: Payer: Self-pay

## 2019-12-12 ENCOUNTER — Ambulatory Visit: Payer: Medicare HMO | Admitting: Physician Assistant

## 2019-12-12 VITALS — BP 120/76 | Ht 66.5 in | Wt 210.0 lb

## 2019-12-12 DIAGNOSIS — Z79899 Other long term (current) drug therapy: Secondary | ICD-10-CM | POA: Diagnosis not present

## 2019-12-12 DIAGNOSIS — I10 Essential (primary) hypertension: Secondary | ICD-10-CM

## 2019-12-12 DIAGNOSIS — I4819 Other persistent atrial fibrillation: Secondary | ICD-10-CM

## 2019-12-12 NOTE — Patient Instructions (Signed)
  Medication Instructions:   Your physician recommends that you continue on your current medications as directed. Please refer to the Current Medication list given to you today.  *If you need a refill on your cardiac medications before your next appointment, please call your pharmacy*  Lab Work:  BMET AND CBC TODAY     If you have labs (blood work) drawn today and your tests are completely normal, you will receive your results only by: Marland Kitchen MyChart Message (if you have MyChart) OR . A paper copy in the mail If you have any lab test that is abnormal or we need to change your treatment, we will call you to review the results.  Testing/Procedures: NONE ORDERED  TODAY   Follow-Up: At [facility], you and your health needs are our priority.  As part of our continuing mission to provide you with exceptional heart care, we have created designated Provider Care Teams.  These Care Teams include your primary Cardiologist (physician) and Advanced Practice Providers (APPs -  Physician Assistants and Nurse Practitioners) who all work together to provide you with the care you need, when you need it.  Your next appointment:   6 month(s)  The format for your next appointment:   In Person  Provider:   You may see  or one of the following Advanced Practice Providers on your designated Care Team:    Chanetta Marshall, NP  Tommye Standard, PA-C  Legrand Como "Oda Kilts, [place]   Other Instructions

## 2019-12-13 LAB — BASIC METABOLIC PANEL
BUN/Creatinine Ratio: 12 (ref 12–28)
BUN: 11 mg/dL (ref 8–27)
CO2: 27 mmol/L (ref 20–29)
Calcium: 9.2 mg/dL (ref 8.7–10.3)
Chloride: 99 mmol/L (ref 96–106)
Creatinine, Ser: 0.89 mg/dL (ref 0.57–1.00)
GFR calc Af Amer: 75 mL/min/{1.73_m2} (ref >59)
GFR calc non Af Amer: 65 mL/min/{1.73_m2} (ref >59)
Glucose: 99 mg/dL (ref 65–99)
Potassium: 4.2 mmol/L (ref 3.5–5.2)
Sodium: 143 mmol/L (ref 134–144)

## 2019-12-13 LAB — CBC
Hematocrit: 37.7 % (ref 34.0–46.6)
Hemoglobin: 12.4 g/dL (ref 11.1–15.9)
MCH: 29 pg (ref 26.6–33.0)
MCHC: 32.9 g/dL (ref 31.5–35.7)
MCV: 88 fL (ref 79–97)
Platelets: 396 10*3/uL (ref 150–450)
RBC: 4.28 x10E6/uL (ref 3.77–5.28)
RDW: 13 % (ref 11.7–15.4)
WBC: 8.2 10*3/uL (ref 3.4–10.8)

## 2019-12-13 NOTE — Addendum Note (Signed)
Addended by: Claude Manges on: 12/13/2019 08:48 AM   Modules accepted: Orders

## 2019-12-23 ENCOUNTER — Other Ambulatory Visit: Payer: Self-pay | Admitting: Physician Assistant

## 2019-12-23 DIAGNOSIS — J449 Chronic obstructive pulmonary disease, unspecified: Secondary | ICD-10-CM

## 2019-12-27 ENCOUNTER — Telehealth: Payer: Self-pay | Admitting: *Deleted

## 2019-12-27 MED ORDER — VASCEPA 1 G PO CAPS: ORAL_CAPSULE | ORAL | 5 refills | Status: DC

## 2019-12-27 NOTE — Telephone Encounter (Signed)
Prior Auth for icosapent ethyl  1gram capsule-Non Formulary  Covered Formulary is Brand Name [place] 1 gram-will send in new rx for brand name only due to insurance preference

## 2020-01-02 ENCOUNTER — Encounter: Payer: Self-pay | Admitting: Physician Assistant

## 2020-01-06 NOTE — Progress Notes (Signed)
Erroneous encounter

## 2020-01-08 DIAGNOSIS — J449 Chronic obstructive pulmonary disease, unspecified: Secondary | ICD-10-CM | POA: Diagnosis not present

## 2020-01-09 ENCOUNTER — Other Ambulatory Visit: Payer: Self-pay | Admitting: Physician Assistant

## 2020-01-11 ENCOUNTER — Other Ambulatory Visit: Payer: Self-pay | Admitting: Physician Assistant

## 2020-01-23 ENCOUNTER — Other Ambulatory Visit: Payer: Self-pay | Admitting: Physician Assistant

## 2020-01-23 DIAGNOSIS — J449 Chronic obstructive pulmonary disease, unspecified: Secondary | ICD-10-CM

## 2020-01-27 ENCOUNTER — Encounter: Payer: Self-pay | Admitting: Physician Assistant

## 2020-02-08 DIAGNOSIS — J449 Chronic obstructive pulmonary disease, unspecified: Secondary | ICD-10-CM | POA: Diagnosis not present

## 2020-02-09 ENCOUNTER — Other Ambulatory Visit: Payer: Self-pay | Admitting: *Deleted

## 2020-02-09 DIAGNOSIS — J449 Chronic obstructive pulmonary disease, unspecified: Secondary | ICD-10-CM

## 2020-02-09 NOTE — Telephone Encounter (Signed)
[name]. NTBS 30 days given 01/23/20

## 2020-02-10 ENCOUNTER — Other Ambulatory Visit: Payer: Self-pay | Admitting: *Deleted

## 2020-02-10 MED ORDER — APIXABAN 5 MG PO TABS: ORAL_TABLET | ORAL | 0 refills | Status: DC

## 2020-02-15 ENCOUNTER — Telehealth: Payer: Self-pay | Admitting: Physician Assistant

## 2020-02-16 ENCOUNTER — Other Ambulatory Visit: Payer: Self-pay

## 2020-02-16 ENCOUNTER — Ambulatory Visit (INDEPENDENT_AMBULATORY_CARE_PROVIDER_SITE_OTHER): Payer: Medicare HMO | Admitting: Family Medicine

## 2020-02-16 ENCOUNTER — Encounter: Payer: Self-pay | Admitting: Family Medicine

## 2020-02-16 VITALS — BP 136/80 | HR 66 | Temp 98.7°F | Ht 66.5 in | Wt 211.6 lb

## 2020-02-16 DIAGNOSIS — N3281 Overactive bladder: Secondary | ICD-10-CM

## 2020-02-16 DIAGNOSIS — I4819 Other persistent atrial fibrillation: Secondary | ICD-10-CM | POA: Diagnosis not present

## 2020-02-16 DIAGNOSIS — E7800 Pure hypercholesterolemia, unspecified: Secondary | ICD-10-CM

## 2020-02-16 DIAGNOSIS — J449 Chronic obstructive pulmonary disease, unspecified: Secondary | ICD-10-CM | POA: Diagnosis not present

## 2020-02-16 MED ORDER — METOPROLOL TARTRATE 25 MG PO TABS: 25.0000 mg | ORAL_TABLET | Freq: Two times a day (BID) | ORAL | 2 refills | Status: DC

## 2020-02-16 MED ORDER — APIXABAN 5 MG PO TABS: ORAL_TABLET | ORAL | 5 refills | Status: DC

## 2020-02-16 MED ORDER — TRELEGY ELLIPTA 100-62.5-25 MCG/INH IN AEPB: 1.0000 | INHALATION_SPRAY | Freq: Every day | RESPIRATORY_TRACT | 5 refills | Status: DC

## 2020-02-16 MED ORDER — MIRABEGRON ER 25 MG PO TB24: 25.0000 mg | ORAL_TABLET | Freq: Every day | ORAL | 5 refills | Status: DC

## 2020-02-16 NOTE — Patient Instructions (Signed)
  Voltaren gel four times a day.

## 2020-02-16 NOTE — Progress Notes (Signed)
Assessment & Plan:  1. COPD  GOLD III - Well controlled on current regimen.  - Fluticasone-Umeclidin-Vilant (TRELEGY ELLIPTA) 100-62.5-25 MCG/INH AEPB; Inhale 1 puff into the lungs daily.  Dispense: 28 each; Refill: 5 - OXYGEN; Inhale 2 L into the lungs continuous.  2. OAB (overactive bladder) - Well controlled on current regimen.  - mirabegron ER (MYRBETRIQ) 25 MG TB24 tablet; Take 1 tablet (25 mg total) by mouth daily.  Dispense: 30 tablet; Refill: 5  3. Hypercholesteremia - Well controlled on current regimen.  - Hepatic function panel; Future - Lipid panel; Future  4. Persistent atrial fibrillation (HCC) - Well controlled on current regimen.  - apixaban (ELIQUIS) 5 MG TABS tablet; TAKE (1) TABLET TWICE A DAY. (Needs to be seen before next refill)  Dispense: 60 tablet; Refill: 5 - metoprolol tartrate (LOPRESSOR) 25 MG tablet; Take 1 tablet (25 mg total) by mouth 2 (two) times daily. (Needs to be seen before next refill)  Dispense: 180 tablet; Refill: 2   Return in about 3 months (around 05/17/2020) for follow-up of chronic medication conditions.  Hendricks Limes, MSN, APRN, FNP-C [facility]  Subjective:    Patient ID: [name], female    DOB: [birth year], 69 y.o.   MRN: [id number]  Patient Care Team: Loman Brooklyn, FNP as PCP - General (Family Medicine) Thompson Grayer, MD as Consulting Physician (Cardiology)   Chief Complaint:  Chief Complaint  Patient presents with  . Establish Care    Jones pt  . COPD    check up of chronic medical conditions    HPI: [name] is a 69 y.o. female presenting on 02/16/2020 for Establish Care Ronnald Ramp pt) and COPD (check up of chronic medical conditions)  Patient uses the Trelegy inhaler daily. She also has Albuterol as needed. She reports she wears 2L of oxygen via [place], although she does not have it on at this time. It is in the car. She has a hard time wearing it with a mask.    Social  history:  Relevant past medical, surgical, family and social history reviewed and updated as indicated. Interim medical history since our last visit reviewed.  Allergies and medications reviewed and updated.  DATA REVIEWED: CHART IN EPIC  ROS: Negative unless specifically indicated above in HPI.    Current Outpatient Medications:  .  albuterol (PROAIR HFA) 108 (90 Base) MCG/ACT inhaler, Inhale 2 puffs into the lungs every 6 (six) hours as needed for wheezing or shortness of breath., Disp: 1 Inhaler, Rfl: 11 .  albuterol (PROVENTIL) (2.5 MG/3ML) 0.083% nebulizer solution, Take 3 mLs (2.5 mg total) by nebulization every 6 (six) hours as needed for wheezing or shortness of breath., Disp: 240 mL, Rfl: 1 .  apixaban (ELIQUIS) 5 MG TABS tablet, TAKE (1) TABLET TWICE A DAY. (Needs to be seen before next refill), Disp: 60 tablet, Rfl: 5 .  Cholecalciferol (VITAMIN D3) 5000 UNITS CAPS, Take 5,000 Units by mouth daily. , Disp: , Rfl:  .  CRANBERRY PO, Take 325 mg by mouth daily. , Disp: , Rfl:  .  docusate sodium (COLACE) 100 MG capsule, Take 100 mg by mouth daily as needed for mild constipation., Disp: , Rfl:  .  Fluticasone-Umeclidin-Vilant (TRELEGY ELLIPTA) 100-62.5-25 MCG/INH AEPB, Inhale 1 puff into the lungs daily., Disp: 28 each, Rfl: 5 .  guaiFENesin (MUCINEX) 600 MG 12 hr tablet, Take 600 mg by mouth every morning. , Disp: , Rfl:  .  metoprolol tartrate (LOPRESSOR) 25  MG tablet, Take 1 tablet (25 mg total) by mouth 2 (two) times daily. (Needs to be seen before next refill), Disp: 180 tablet, Rfl: 2 .  mirabegron ER (MYRBETRIQ) 25 MG TB24 tablet, Take 1 tablet (25 mg total) by mouth daily., Disp: 30 tablet, Rfl: 5 .  montelukast (SINGULAIR) 10 MG tablet, Take 1 tablet (10 mg total) by mouth at bedtime., Disp: 90 tablet, Rfl: 3 .  OXYGEN, Inhale 2 L into the lungs continuous., Disp: , Rfl:  .  pantoprazole (PROTONIX) 40 MG tablet, Take 1 tablet (40 mg total) by mouth daily., Disp: 90 tablet,  Rfl: 3 .  valACYclovir (VALTREX) 1000 MG tablet, Take 1 tablet (1,000 mg total) by mouth 2 (two) times daily., Disp: 20 tablet, Rfl: 0 .  VASCEPA 1 g capsule, TAKE (2) CAPSULES TWICE DAILY., Disp: 120 capsule, Rfl: 5   Allergies  Allergen Reactions  . Penicillins Rash    Has patient had a PCN reaction causing immediate rash, facial/tongue/throat swelling, SOB or lightheadedness with hypotension: Yes Has patient had a PCN reaction causing severe rash involving mucus membranes or skin necrosis: Yes Has patient had a PCN reaction that required hospitalization: No Has patient had a PCN reaction occurring within the last 10 years: No If all of the above answers are "NO", then may proceed with Cephalosporin use.   Marland Kitchen Dilaudid [Hydromorphone Hcl]    Past Medical History:  Diagnosis Date  . COPD (chronic obstructive pulmonary disease) ([place])   . GERD (gastroesophageal reflux disease)   . Hypercholesteremia   . Mild mitral regurgitation   . Mitral valve prolapse    a. dx 1985, not seen on echoes more recently.  . Obesity   . Paroxysmal atrial fibrillation (HCC)   . Paroxysmal atrial flutter ([place])    a. dx 05/2017.    Past Surgical History:  Procedure Laterality Date  . ABDOMINAL HYSTERECTOMY    . ATRIAL FIBRILLATION ABLATION N/A 05/04/2018   Procedure: ATRIAL FIBRILLATION ABLATION;  Surgeon: Thompson Grayer, MD;  Location: [facility];  Service: Cardiovascular;  Laterality: N/A;  . CARDIOVERSION N/A 10/08/2017   Procedure: CARDIOVERSION;  Surgeon: Herminio Commons, MD;  Location[facility];  Service: Cardiovascular;  Laterality: N/A;  . CESAREAN SECTION    . TEE WITHOUT CARDIOVERSION N/A 05/03/2018   Procedure: TRANSESOPHAGEAL ECHOCARDIOGRAM (TEE);  Surgeon: Fay Records, MD;  Location[facility];  Service: Cardiovascular;  Laterality: N/A;  . TUBAL LIGATION      Social History   Socioeconomic History  . Marital status: Married    Spouse name: Not on file  . Number of  children: 1  . Years of education: 38  . Highest education level: 12th grade  Occupational History  . 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 Occupation: [employer]     Comment: retired  Tobacco Use  . Smoking status: Former Smoker    Packs/day: 1.00    Years: 20.00    Pack years: 20.00    Types: Cigarettes    Quit date: 08/14/1990    Years since quitting: 29.5  . Smokeless tobacco: Never Used  Substance and Sexual Activity  . Alcohol use: No    Alcohol/week: 0.0 standard drinks  . Drug use: No  . Sexual activity: Not Currently  Other Topics Concern  . Not on file  Social History Narrative  . Not on file   Social Determinants of Health   Financial Resource  Strain:   . Difficulty of Paying Living Expenses:   Food Insecurity:   . Worried About Charity fundraiser in the Last Year:   . Arboriculturist in the Last Year:   Transportation Needs:   . Film/video editor (Medical):   Marland Kitchen Lack of Transportation (Non-Medical):   Physical Activity:   . Days of Exercise per Week:   . Minutes of Exercise per Session:   Stress:   . Feeling of Stress :   Social Connections: Unknown  . Frequency of Communication with Friends and Family: Not on file  . Frequency of Social Gatherings with Friends and Family: Twice a week  . Attends Religious Services: Not on file  . Active Member of Clubs or Organizations: Not on file  . Attends Archivist Meetings: 1 to 4 times per year  . Marital Status: Not on file  Intimate Partner Violence:   . Fear of Current or Ex-Partner:   . Emotionally Abused:   Marland Kitchen Physically Abused:   . Sexually Abused:         Objective:    BP 136/80   Pulse 66   Temp 98.7 F (37.1 C) (Temporal)   Ht 5' 6.5" (1.689 m)   Wt 211 lb 9.6 oz (96 kg)   SpO2 90%   BMI 33.64 kg/m   Wt Readings from Last 3 Encounters:  02/16/20 211 lb 9.6 oz (96 kg)  12/12/19 210 lb (95.3 kg)  06/06/19 212 lb (96.2 kg)    Physical  Exam Vitals reviewed.  Constitutional:      General: She is not in acute distress.    Appearance: Normal appearance. She is obese. She is not ill-appearing, toxic-appearing or diaphoretic.  HENT:     Head: Normocephalic and atraumatic.  Eyes:     General: No scleral icterus.       Right eye: No discharge.        Left eye: No discharge.     Conjunctiva/sclera: Conjunctivae normal.  Cardiovascular:     Rate and Rhythm: Normal rate and regular rhythm.     Heart sounds: Normal heart sounds. No murmur. No friction rub. No gallop.   Pulmonary:     Effort: Pulmonary effort is normal. No respiratory distress.     Breath sounds: Normal breath sounds. No stridor. No wheezing, rhonchi or rales.  Musculoskeletal:        General: Normal range of motion.     Cervical back: Normal range of motion.  Skin:    General: Skin is warm and dry.     Capillary Refill: Capillary refill takes less than 2 seconds.  Neurological:     General: No focal deficit present.     Mental Status: She is alert and oriented to person, place, and time. Mental status is at baseline.  Psychiatric:        Mood and Affect: Mood normal.        Behavior: Behavior normal.        Thought Content: Thought content normal.        Judgment: Judgment normal.     Lab Results  Component Value Date   TSH 2.390 06/20/2019   Lab Results  Component Value Date   WBC 8.2 12/12/2019   HGB 12.4 12/12/2019   HCT 37.7 12/12/2019   MCV 88 12/12/2019   PLT 396 12/12/2019   Lab Results  Component Value Date   NA 143 12/12/2019   K 4.2 12/12/2019  CO2 27 12/12/2019   GLUCOSE 99 12/12/2019   BUN 11 12/12/2019   CREATININE 0.89 12/12/2019   BILITOT 1.1 06/20/2019   ALKPHOS 102 06/20/2019   AST 15 06/20/2019   ALT 9 06/20/2019   PROT 6.7 06/20/2019   ALBUMIN 3.9 06/20/2019   CALCIUM 9.2 12/12/2019   ANIONGAP 8 05/04/2018   Lab Results  Component Value Date   CHOL 214 (H) 06/20/2019   Lab Results  Component Value Date    HDL 40 06/20/2019   Lab Results  Component Value Date   LDLCALC 144 (H) 06/20/2019   Lab Results  Component Value Date   TRIG 149 06/20/2019   Lab Results  Component Value Date   CHOLHDL 5.4 (H) 06/20/2019   No results found for: HGBA1C

## 2020-02-20 ENCOUNTER — Telehealth: Payer: Self-pay | Admitting: Family Medicine

## 2020-02-20 NOTE — Chronic Care Management (AMB) (Signed)
Chronic Care Management   Note  02/20/2020 Name: [name] MRN: [id number] DOB: 12-16-1950  [name] is a 69 y.o. year old female who is a primary care patient of Loman Brooklyn, FNP. I reached out to [name] by phone today in response to a referral sent by [name]'s health plan.     [name] was given information about Chronic Care Management services today including:  1. CCM service includes personalized support from designated clinical staff supervised by her physician, including individualized plan of care and coordination with other care providers 2. 24/7 contact phone numbers for assistance for urgent and routine care needs. 3. Service will only be billed when office clinical staff spend 20 minutes or more in a month to coordinate care. 4. Only one practitioner may furnish and bill the service in a calendar month. 5. The patient may stop CCM services at any time (effective at the end of the month) by phone call to the office staff. 6. The patient will be responsible for cost sharing (co-pay) of up to 20% of the service fee (after annual deductible is met).  Patient agreed to services and verbal consent obtained.   Follow up plan: Telephone appointment with care management team member scheduled for:03/30/2020  Noreene Larsson, [place], [place], [place] [address] Direct Dial: [phone] [email or web address] Website: [email or web address]

## 2020-02-27 ENCOUNTER — Other Ambulatory Visit: Payer: Medicare HMO

## 2020-02-27 ENCOUNTER — Other Ambulatory Visit: Payer: Self-pay

## 2020-02-27 DIAGNOSIS — E7800 Pure hypercholesterolemia, unspecified: Secondary | ICD-10-CM

## 2020-02-28 ENCOUNTER — Other Ambulatory Visit: Payer: Self-pay | Admitting: Family Medicine

## 2020-02-28 LAB — LIPID PANEL
Chol/HDL Ratio: 5.6 ratio — ABNORMAL HIGH (ref 0.0–4.4)
Cholesterol, Total: 239 mg/dL — ABNORMAL HIGH (ref 100–199)
HDL: 43 mg/dL (ref >39)
LDL Chol Calc (NIH): 166 mg/dL — ABNORMAL HIGH (ref 0–99)
Triglycerides: 162 mg/dL — ABNORMAL HIGH (ref 0–149)
VLDL Cholesterol Cal: 30 mg/dL (ref 5–40)

## 2020-02-28 LAB — HEPATIC FUNCTION PANEL
ALT: 11 IU/L (ref 0–32)
AST: 17 IU/L (ref 0–40)
Albumin: 4 g/dL (ref 3.7–4.7)
Alkaline Phosphatase: 113 IU/L (ref 39–117)
Bilirubin Total: 0.7 mg/dL (ref 0.0–1.2)
Bilirubin, Direct: 0.13 mg/dL (ref 0.00–0.40)
Total Protein: 6.7 g/dL (ref 6.0–8.5)

## 2020-02-28 MED ORDER — EZETIMIBE 10 MG PO TABS: 10.0000 mg | ORAL_TABLET | Freq: Every day | ORAL | 2 refills | Status: DC

## 2020-03-02 ENCOUNTER — Ambulatory Visit: Payer: Medicare HMO

## 2020-03-07 ENCOUNTER — Encounter: Payer: Self-pay | Admitting: Family Medicine

## 2020-03-09 DIAGNOSIS — J449 Chronic obstructive pulmonary disease, unspecified: Secondary | ICD-10-CM | POA: Diagnosis not present

## 2020-03-30 ENCOUNTER — Ambulatory Visit: Payer: Medicare HMO | Admitting: Licensed Clinical Social Worker

## 2020-03-30 DIAGNOSIS — N3281 Overactive bladder: Secondary | ICD-10-CM

## 2020-03-30 DIAGNOSIS — J449 Chronic obstructive pulmonary disease, unspecified: Secondary | ICD-10-CM

## 2020-03-30 DIAGNOSIS — I4891 Unspecified atrial fibrillation: Secondary | ICD-10-CM

## 2020-03-30 DIAGNOSIS — I059 Rheumatic mitral valve disease, unspecified: Secondary | ICD-10-CM

## 2020-03-30 DIAGNOSIS — E7800 Pure hypercholesterolemia, unspecified: Secondary | ICD-10-CM

## 2020-03-30 NOTE — Patient Instructions (Addendum)
Licensed Clinical Social Worker Visit Information  Materials Provided: No  03/30/2020   Name: [name] MRN: [id number] DOB: 1951-06-26   [name] is a 69 y.o. year old female who is a primary care patient of Loman Brooklyn, FNP. The CCM team was consulted for assistance with Community resources.   Review of patient status, including review of consultants reports, other relevant assessments, and collaboration with appropriate care team members and the patient's provider was performed as part of comprehensive patient evaluation and provision of chronic care management services.   SDOH (Social Determinants of Health) assessments performed: No   LCSW called client home and cell phone numbers today but LCSW was not able to speak via phone with client. However, LCSW did leave a phone message for client requesting that she please return call to LCSW at [phone] to discuss CCM program services   Follow Up Plan: LCSW to call client in next 4 weeks to talk with her about CCM program services available.  LCSW was not able to speak via phone with client today; thus, the patient was not able to verbalize understanding of instructions provided today and was not able to accept or decline a print copy of patient instruction materials.   Norva Riffle.Camp Gopal MSW, LCSW Licensed Clinical Social Worker [facility] Care Management [phone]

## 2020-03-30 NOTE — Chronic Care Management (AMB) (Signed)
  Chronic Care Management    Clinical Social Work Follow Up Note  03/30/2020 Name: [name] MRN: [id number] DOB: 1951-02-23  [name] is a 69 y.o. year old female who is a primary care patient of Loman Brooklyn, FNP. The CCM team was consulted for assistance with Community resources.   Review of patient status, including review of consultants reports, other relevant assessments, and collaboration with appropriate care team members and the patient's provider was performed as part of comprehensive patient evaluation and provision of chronic care management services.    SDOH (Social Determinants of Health) assessments performed: No   Outpatient Encounter Medications as of 03/30/2020  Medication Sig  . albuterol (PROAIR HFA) 108 (90 Base) MCG/ACT inhaler Inhale 2 puffs into the lungs every 6 (six) hours as needed for wheezing or shortness of breath.  Marland Kitchen albuterol (PROVENTIL) (2.5 MG/3ML) 0.083% nebulizer solution Take 3 mLs (2.5 mg total) by nebulization every 6 (six) hours as needed for wheezing or shortness of breath.  Marland Kitchen apixaban (ELIQUIS) 5 MG TABS tablet TAKE (1) TABLET TWICE A DAY. (Needs to be seen before next refill)  . Cholecalciferol (VITAMIN D3) 5000 UNITS CAPS Take 5,000 Units by mouth daily.   Marland Kitchen CRANBERRY PO Take 325 mg by mouth daily.   Marland Kitchen docusate sodium (COLACE) 100 MG capsule Take 100 mg by mouth daily as needed for mild constipation.  Marland Kitchen ezetimibe (ZETIA) 10 MG tablet Take 1 tablet (10 mg total) by mouth daily.  . Fluticasone-Umeclidin-Vilant (TRELEGY ELLIPTA) 100-62.5-25 MCG/INH AEPB Inhale 1 puff into the lungs daily.  Marland Kitchen guaiFENesin (MUCINEX) 600 MG 12 hr tablet Take 600 mg by mouth every morning.   . metoprolol tartrate (LOPRESSOR) 25 MG tablet Take 1 tablet (25 mg total) by mouth 2 (two) times daily. (Needs to be seen before next refill)  . mirabegron ER (MYRBETRIQ) 25 MG TB24 tablet Take 1 tablet (25 mg total) by mouth daily.  . montelukast (SINGULAIR) 10 MG  tablet Take 1 tablet (10 mg total) by mouth at bedtime.  . OXYGEN Inhale 2 L into the lungs continuous.  . pantoprazole (PROTONIX) 40 MG tablet Take 1 tablet (40 mg total) by mouth daily.  . valACYclovir (VALTREX) 1000 MG tablet Take 1 tablet (1,000 mg total) by mouth 2 (two) times daily.  Marland Kitchen VASCEPA 1 g capsule TAKE (2) CAPSULES TWICE DAILY.   No facility-administered encounter medications on file as of 03/30/2020.    LCSW called client home and cell phone numbers today but LCSW was not able to speak via phone with client. However, LCSW did leave a phone message for client requesting that she please return call to LCSW at [phone] to discuss CCM program services   Follow Up Plan: LCSW to call client in next 4 weeks to talk with her about CCM program services available.  Norva Riffle.Dorrene Bently MSW, LCSW Licensed Clinical Social Worker [facility] Care Management [phone]

## 2020-04-09 DIAGNOSIS — J449 Chronic obstructive pulmonary disease, unspecified: Secondary | ICD-10-CM | POA: Diagnosis not present

## 2020-05-04 ENCOUNTER — Ambulatory Visit (INDEPENDENT_AMBULATORY_CARE_PROVIDER_SITE_OTHER): Payer: Medicare HMO | Admitting: Licensed Clinical Social Worker

## 2020-05-04 DIAGNOSIS — E7800 Pure hypercholesterolemia, unspecified: Secondary | ICD-10-CM

## 2020-05-04 DIAGNOSIS — J449 Chronic obstructive pulmonary disease, unspecified: Secondary | ICD-10-CM | POA: Diagnosis not present

## 2020-05-04 DIAGNOSIS — I4891 Unspecified atrial fibrillation: Secondary | ICD-10-CM

## 2020-05-04 NOTE — Chronic Care Management (AMB) (Signed)
Chronic Care Management    Clinical Social Work Follow Up Note  05/04/2020 Name: [name] MRN: [id number] DOB: 11-May-1951  [name] is a 69 y.o. year old female who is a primary care patient of Marie Brooklyn, FNP. The CCM team was consulted for assistance with [employer] .   Review of patient status, including review of consultants reports, other relevant assessments, and collaboration with appropriate care team members and the patient's provider was performed as part of comprehensive patient evaluation and provision of chronic care management services.    SDOH (Social Determinants of Health) assessments performed: Yes;risk for tobacco use; risk for depression; risk for stress;     Chronic Care Management from 05/04/2020 in [facility]  PHQ-9 Total Score 3       GAD 7 : Generalized Anxiety Score 05/04/2020  Nervous, Anxious, on Edge 0  Control/stop worrying 0  Worry too much - different things 0  Trouble relaxing 0  Restless 1  Easily annoyed or irritable 0  Afraid - awful might happen 0  Total GAD 7 Score 1  Anxiety Difficulty Somewhat difficult    Outpatient Encounter Medications as of 05/04/2020  Medication Sig  . albuterol (PROAIR HFA) 108 (90 Base) MCG/ACT inhaler Inhale 2 puffs into the lungs every 6 (six) hours as needed for wheezing or shortness of breath.  Marland Kitchen albuterol (PROVENTIL) (2.5 MG/3ML) 0.083% nebulizer solution Take 3 mLs (2.5 mg total) by nebulization every 6 (six) hours as needed for wheezing or shortness of breath.  Marland Kitchen apixaban (ELIQUIS) 5 MG TABS tablet TAKE (1) TABLET TWICE A DAY. (Needs to be seen before next refill)  . Cholecalciferol (VITAMIN D3) 5000 UNITS CAPS Take 5,000 Units by mouth daily.   Marland Kitchen CRANBERRY PO Take 325 mg by mouth daily.   Marland Kitchen docusate sodium (COLACE) 100 MG capsule Take 100 mg by mouth daily as needed for mild constipation.  Marland Kitchen ezetimibe (ZETIA) 10 MG tablet Take 1 tablet (10 mg total) by mouth  daily.  . Fluticasone-Umeclidin-Vilant (TRELEGY ELLIPTA) 100-62.5-25 MCG/INH AEPB Inhale 1 puff into the lungs daily.  Marland Kitchen guaiFENesin (MUCINEX) 600 MG 12 hr tablet Take 600 mg by mouth every morning.   . metoprolol tartrate (LOPRESSOR) 25 MG tablet Take 1 tablet (25 mg total) by mouth 2 (two) times daily. (Needs to be seen before next refill)  . mirabegron ER (MYRBETRIQ) 25 MG TB24 tablet Take 1 tablet (25 mg total) by mouth daily.  . montelukast (SINGULAIR) 10 MG tablet Take 1 tablet (10 mg total) by mouth at bedtime.  . OXYGEN Inhale 2 L into the lungs continuous.  . pantoprazole (PROTONIX) 40 MG tablet Take 1 tablet (40 mg total) by mouth daily.  . valACYclovir (VALTREX) 1000 MG tablet Take 1 tablet (1,000 mg total) by mouth 2 (two) times daily.  Marland Kitchen VASCEPA 1 g capsule TAKE (2) CAPSULES TWICE DAILY.   No facility-administered encounter medications on file as of 05/04/2020.    Goals    .  Client will talk with LCSW in next 30 days about community resources of help to client (pt-stated)      CARE PLAN ENTRY   Current Barriers:  . Oxygen use daily for client with Chronic Diagnoses of Obesity, Atrial Fibrillation, COPD, Anemia, Hypercholesteremia  Clinical Social Work Clinical Goal(s):  Marland Kitchen LCSW will call client in next 4 weeks to talk with client about community resources of help to client  Interventions:  . Talked with client about CCM  program services . Talked with client about pain issues of client . Talked with client about sleeping challenges of client . Talked with client about social support network (spouse is supportive and son is supportive) . Talked with [name] about  relaxation techniques of client (watches TV) . Talked with client about oxygen use of client . Talked with client about upcoming medical appointments . Talked with client about transport needs of client . Talked with client about her completion of ADLs . Talked with client about vision challenges of  client . Talked with client about decreased energy level of client  Patient Self Care Activities:   Completes ADLs independently Drives car to appointments and to complete errands  Patient Self Care Deficits:   Oxygen use daily (uses 2 liters of oxygen continuously daily)   Initial goal documentation      Follow Up Plan: LCSW will call client in next 4 weeks to talk with client about community resources of help to client  Norva Riffle.[name] MSW, LCSW Licensed Clinical Social Worker [facility] Care [facility] [phone]

## 2020-05-04 NOTE — Patient Instructions (Addendum)
Licensed Clinical Education officer, museum Visit Information  Goals we discussed today:  Goals Addressed              This Visit's Progress     Client will talk with LCSW in next 30 days about community resources of help to client (pt-stated)        CARE PLAN ENTRY   Current Barriers:   Oxygen use daily for client with Chronic Diagnoses of Obesity, Atrial Fibrillation, COPD, Anemia, Hypercholesteremia  Clinical Social Work Clinical Goal(s):   LCSW will call client in next 4 weeks to talk with client about community resources of help to client  Interventions:   Talked with client about CCM program services  Talked with client about pain issues of client  Talked with client about sleeping challenges of client  Talked with client about social support network (spouse is supportive and son is supportive)  Talked with Shannen about  relaxation techniques of client (watches TV)  Talked with client about oxygen use of client  Talked with client about upcoming medical appointments  Talked with client about transport needs of client  Talked with client about her completion of ADLs  Talked with client about vision challenges of client  Talked with client about decreased energy level of client  Patient Self Care Activities:   Completes ADLs independently Drives car to appointments and to complete errands  Patient Self Care Deficits:   Oxygen use daily (uses 2 liters of oxygen continuously daily)   Initial goal documentation     Materials Provided: No  Follow Up Plan: LCSW will call client in next 4 weeks to talk with client about community resources of help to client  The patient verbalized understanding of instructions provided today and declined a print copy of patient instruction materials.   Norva Riffle.Kagen Kunath MSW, LCSW Licensed Clinical Social Worker [facility] Care [facility] [phone]

## 2020-05-09 DIAGNOSIS — J449 Chronic obstructive pulmonary disease, unspecified: Secondary | ICD-10-CM | POA: Diagnosis not present

## 2020-05-18 ENCOUNTER — Other Ambulatory Visit: Payer: Self-pay | Admitting: Family Medicine

## 2020-06-07 ENCOUNTER — Ambulatory Visit: Payer: Medicare HMO | Admitting: Licensed Clinical Social Worker

## 2020-06-07 DIAGNOSIS — I4891 Unspecified atrial fibrillation: Secondary | ICD-10-CM

## 2020-06-07 DIAGNOSIS — J449 Chronic obstructive pulmonary disease, unspecified: Secondary | ICD-10-CM

## 2020-06-07 DIAGNOSIS — E7800 Pure hypercholesterolemia, unspecified: Secondary | ICD-10-CM

## 2020-06-07 DIAGNOSIS — N3281 Overactive bladder: Secondary | ICD-10-CM

## 2020-06-07 NOTE — Patient Instructions (Addendum)
Licensed Clinical Social Worker Visit Information  Goals we discussed today:    .  Client will talk with LCSW in next 30 days about community resources of help to client (pt-stated)        CARE PLAN ENTRY   Current Barriers:   Oxygen use daily for client with Chronic Diagnoses of Obesity, Atrial Fibrillation, COPD, Anemia, Hypercholesteremia  Clinical Social Work Clinical Goal(s):   LCSW will call client in next 4 weeks to talk with client about community resources of help to client  Interventions:   Talked with client about CCM program services  Talked with client about pain issues of client  Talked with client about sleeping challenges of client  Talked with client about social support network (spouse is supportive and son is supportive)  Talked with Nyah about  relaxation techniques of client (watches TV)  Talked with client about oxygen use of client  Talked with client about upcoming medical appointments  Talked with client about transport needs of client  Talked with client about her completion of ADLs  Talked with client about vision challenges of client  Talked with client about decreased energy level of client  Talked with Lilyana about food procurement of client  Patient Self Care Activities:   Completes ADLs independently Drives car to appointments and to complete errands  Patient Self Care Deficits:   Oxygen use daily (uses 2 liters of oxygen continuously daily)  Initial goal documentation    Follow Up Plan: LCSW to call client in next 4 weeks to talk with client about community resources of help to client  Materials Provided: No  The patient verbalized understanding of instructions provided today and declined a print copy of patient instruction materials.   Norva Riffle.Jack Mineau MSW, LCSW Licensed Clinical Social Worker [facility] [phone]

## 2020-06-07 NOTE — Chronic Care Management (AMB) (Signed)
Chronic Care Management    Clinical Social Work Follow Up Note  06/07/2020 Name: [name] MRN: [id number] DOB: 04-Jun-1951  [name] is a 69 y.o. year old female who is a primary care patient of Loman Brooklyn, FNP. The CCM team was consulted for assistance with [employer] .   Review of patient status, including review of consultants reports, other relevant assessments, and collaboration with appropriate care team members and the patient's provider was performed as part of comprehensive patient evaluation and provision of chronic care management services.    SDOH (Social Determinants of Health) assessments performed: No;risk for tobacco use; risk for depression; risk for stress    Chronic Care Management from 05/04/2020 in [place]  PHQ-9 Total Score 3       GAD 7 : Generalized Anxiety Score 05/04/2020  Nervous, Anxious, on Edge 0  Control/stop worrying 0  Worry too much - different things 0  Trouble relaxing 0  Restless 1  Easily annoyed or irritable 0  Afraid - awful might happen 0  Total GAD 7 Score 1  Anxiety Difficulty Somewhat difficult    Outpatient Encounter Medications as of 06/07/2020  Medication Sig  . albuterol (PROAIR HFA) 108 (90 Base) MCG/ACT inhaler Inhale 2 puffs into the lungs every 6 (six) hours as needed for wheezing or shortness of breath.  Marland Kitchen albuterol (PROVENTIL) (2.5 MG/3ML) 0.083% nebulizer solution Take 3 mLs (2.5 mg total) by nebulization every 6 (six) hours as needed for wheezing or shortness of breath.  Marland Kitchen apixaban (ELIQUIS) 5 MG TABS tablet TAKE (1) TABLET TWICE A DAY. (Needs to be seen before next refill)  . Cholecalciferol (VITAMIN D3) 5000 UNITS CAPS Take 5,000 Units by mouth daily.   Marland Kitchen CRANBERRY PO Take 325 mg by mouth daily.   Marland Kitchen docusate sodium (COLACE) 100 MG capsule Take 100 mg by mouth daily as needed for mild constipation.  Marland Kitchen ezetimibe (ZETIA) 10 MG tablet TAKE 1 TABLET DAILY  .  Fluticasone-Umeclidin-Vilant (TRELEGY ELLIPTA) 100-62.5-25 MCG/INH AEPB Inhale 1 puff into the lungs daily.  Marland Kitchen guaiFENesin (MUCINEX) 600 MG 12 hr tablet Take 600 mg by mouth every morning.   . metoprolol tartrate (LOPRESSOR) 25 MG tablet Take 1 tablet (25 mg total) by mouth 2 (two) times daily. (Needs to be seen before next refill)  . mirabegron ER (MYRBETRIQ) 25 MG TB24 tablet Take 1 tablet (25 mg total) by mouth daily.  . montelukast (SINGULAIR) 10 MG tablet Take 1 tablet (10 mg total) by mouth at bedtime.  . OXYGEN Inhale 2 L into the lungs continuous.  . pantoprazole (PROTONIX) 40 MG tablet Take 1 tablet (40 mg total) by mouth daily.  . valACYclovir (VALTREX) 1000 MG tablet Take 1 tablet (1,000 mg total) by mouth 2 (two) times daily.  Marland Kitchen VASCEPA 1 g capsule TAKE (2) CAPSULES TWICE DAILY.   No facility-administered encounter medications on file as of 06/07/2020.    Goals    .  Client will talk with LCSW in next 30 days about community resources of help to client (pt-stated)      CARE PLAN ENTRY   Current Barriers:  . Oxygen use daily for client with Chronic Diagnoses of Obesity, Atrial Fibrillation, COPD, Anemia, Hypercholesteremia  Clinical Social Work Clinical Goal(s):  Marland Kitchen LCSW will call client in next 4 weeks to talk with client about community resources of help to client  Interventions:  . Talked with client about CCM program services . Talked with client  about pain issues of client . Talked with client about sleeping challenges of client . Talked with client about social support network (spouse is supportive and son is supportive) . Talked with Jeannemarie about  relaxation techniques of client (watches TV) . Talked with client about oxygen use of client . Talked with client about upcoming medical appointments . Talked with client about transport needs of client . Talked with client about her completion of ADLs . Talked with client about vision challenges of client . Talked with  client about decreased energy level of client . Talked with Katharine Look about food procurement of client  Patient Self Care Activities:   Completes ADLs independently Drives car to appointments and to complete errands  Patient Self Care Deficits:   Oxygen use daily (uses 2 liters of oxygen continuously daily)  Initial goal documentation    Follow Up Plan: LCSW to call client in next 4 weeks to talk with client about community resources of help to client  Norva Riffle.Kellan Boehlke MSW, LCSW Licensed Clinical Social Worker [facility] [phone]

## 2020-06-09 DIAGNOSIS — J449 Chronic obstructive pulmonary disease, unspecified: Secondary | ICD-10-CM | POA: Diagnosis not present

## 2020-06-15 ENCOUNTER — Other Ambulatory Visit: Payer: Self-pay | Admitting: Family Medicine

## 2020-06-19 ENCOUNTER — Ambulatory Visit (INDEPENDENT_AMBULATORY_CARE_PROVIDER_SITE_OTHER): Payer: Medicare HMO | Admitting: *Deleted

## 2020-06-19 DIAGNOSIS — Z0000 Encounter for general adult medical examination without abnormal findings: Secondary | ICD-10-CM

## 2020-06-19 NOTE — Progress Notes (Signed)
MEDICARE ANNUAL WELLNESS VISIT  06/19/2020  Telephone Visit Disclaimer This Medicare AWV was conducted by telephone due to national recommendations for restrictions regarding the COVID-19 Pandemic (e.g. social distancing).  I verified, using two identifiers, that I am speaking with [name] or their authorized healthcare agent. I discussed the limitations, risks, security, and privacy concerns of performing an evaluation and management service by telephone and the potential availability of an in-person appointment in the future. The patient expressed understanding and agreed to proceed.   Subjective:  [name] is a 69 y.o. female patient of Loman Brooklyn, FNP who had a Medicare Annual Wellness Visit today via telephone. Arkie is Retired and lives with their spouse. she has 1 child. she reports that she is socially active and does interact with friends/family regularly. she is minimally physically active and enjoys going to yard sales, watching TV, shopping, crafting and spending time with her family.  Patient Care Team: Loman Brooklyn, FNP as PCP - General (Family Medicine) Thompson Grayer, MD as Consulting Physician (Cardiology) Shea Evans Norva Riffle, LCSW as [place] Management (Licensed Clinical Social Worker)  Advanced Directives 06/19/2020 06/17/2019 06/17/2019 09/28/2018 06/14/2018 05/04/2018 05/03/2018  Does Patient Have a Medical Advance Directive? No No No No No No No  Would patient like information on creating a medical advance directive? No - Patient declined Yes (MAU/Ambulatory/Procedural Areas - Information given) - - No - Patient declined No - Patient declined No - Patient declined  Pre-existing out of facility DNR order (yellow form or pink MOST form) - - - - - - -    Hospital Utilization Over the Past 12 Months: # of hospitalizations or ER visits: 0 # of surgeries: 0  Review of Systems    Patient reports that her overall health is  unchanged compared to last year.  History obtained from chart review and the patient  Patient Reported Readings (BP, Pulse, CBG, Weight, etc) none  Pain Assessment Pain : No/denies pain     Current Medications & Allergies (verified) Allergies as of 06/19/2020      Reactions   Penicillins Rash   Has patient had a PCN reaction causing immediate rash, facial/tongue/throat swelling, SOB or lightheadedness with hypotension: Yes Has patient had a PCN reaction causing severe rash involving mucus membranes or skin necrosis: Yes Has patient had a PCN reaction that required hospitalization: No Has patient had a PCN reaction occurring within the last 10 years: No If all of the above answers are "NO", then may proceed with Cephalosporin use.   Dilaudid [hydromorphone Hcl]       Medication List       Accurate as of June 19, 2020 11:27 AM. If you have any questions, ask your nurse or doctor.        STOP taking these medications   CRANBERRY PO   docusate sodium 100 MG capsule Commonly known as: COLACE     TAKE these medications   albuterol 108 (90 Base) MCG/ACT inhaler Commonly known as: ProAir HFA Inhale 2 puffs into the lungs every 6 (six) hours as needed for wheezing or shortness of breath.   albuterol (2.5 MG/3ML) 0.083% nebulizer solution Commonly known as: PROVENTIL Take 3 mLs (2.5 mg total) by nebulization every 6 (six) hours as needed for wheezing or shortness of breath.   apixaban 5 MG Tabs tablet Commonly known as: Eliquis TAKE (1) TABLET TWICE A DAY. (Needs to be seen before next refill)   ezetimibe  10 MG tablet Commonly known as: ZETIA Take 1 tablet (10 mg total) by mouth daily. Needs to be seen before next refill   guaiFENesin 600 MG 12 hr tablet Commonly known as: MUCINEX Take 600 mg by mouth every morning.   metoprolol tartrate 25 MG tablet Commonly known as: LOPRESSOR Take 1 tablet (25 mg total) by mouth 2 (two) times daily. (Needs to be seen before next  refill)   mirabegron ER 25 MG Tb24 tablet Commonly known as: Myrbetriq Take 1 tablet (25 mg total) by mouth daily.   montelukast 10 MG tablet Commonly known as: SINGULAIR Take 1 tablet (10 mg total) by mouth at bedtime.   OXYGEN Inhale 2 L into the lungs continuous.   pantoprazole 40 MG tablet Commonly known as: PROTONIX Take 1 tablet (40 mg total) by mouth daily.   Trelegy Ellipta 100-62.5-25 MCG/INH Aepb Generic drug: Fluticasone-Umeclidin-Vilant Inhale 1 puff into the lungs daily.   valACYclovir 1000 MG tablet Commonly known as: VALTREX Take 1 tablet (1,000 mg total) by mouth 2 (two) times daily.   Vascepa 1 g capsule Generic drug: icosapent Ethyl TAKE (2) CAPSULES TWICE DAILY.   Vitamin D3 125 MCG (5000 UT) Caps Take 5,000 Units by mouth daily.       History (reviewed): Past Medical History:  Diagnosis Date  . COPD (chronic obstructive pulmonary disease) ([place])   . GERD (gastroesophageal reflux disease)   . Hypercholesteremia   . Mild mitral regurgitation   . Mitral valve prolapse    a. dx 1985, not seen on echoes more recently.  . Obesity   . Paroxysmal atrial fibrillation ([facility])   . Paroxysmal atrial flutter ([place])    a. dx 05/2017.   Past Surgical History:  Procedure Laterality Date  . ABDOMINAL HYSTERECTOMY    . ATRIAL FIBRILLATION ABLATION N/A 05/04/2018   Procedure: ATRIAL FIBRILLATION ABLATION;  Surgeon: Thompson Grayer, MD;  Location: [facility];  Service: Cardiovascular;  Laterality: N/A;  . CARDIOVERSION N/A 10/08/2017   Procedure: CARDIOVERSION;  Surgeon: Herminio Commons, MD;  [facility];  Service: Cardiovascular;  Laterality: N/A;  . CESAREAN SECTION    . TEE WITHOUT CARDIOVERSION N/A 05/03/2018   Procedure: TRANSESOPHAGEAL ECHOCARDIOGRAM (TEE);  Surgeon: Fay Records, MD;  [facility];  Service: Cardiovascular;  Laterality: N/A;  . TUBAL LIGATION     Family History  Problem Relation Age of Onset  . Heart attack  Mother 54  . Asthma Mother   . Emphysema Father        smoked  . Heart disease Father   . Atrial fibrillation Brother   . Hypertension Son    Social History   Socioeconomic History  . Marital status: Married    Spouse name: Alvis  . Number of children: 1  . Years of education: 27  . Highest education level: 12th grade  Occupational History  . 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 Occupation: [employer]     Comment: retired  Tobacco Use  . Smoking status: Former Smoker    Packs/day: 1.00    Years: 20.00    Pack years: 20.00    Types: Cigarettes    Quit date: 08/14/1990    Years since quitting: 29.8  . Smokeless tobacco: Never Used  Vaping Use  . Vaping Use: Never used  Substance and Sexual Activity  . Alcohol use: No    Alcohol/week: 0.0 standard drinks  . Drug  use: No  . Sexual activity: Not Currently  Other Topics Concern  . Not on file  Social History Narrative  . Not on file   Social Determinants of Health   Financial Resource Strain:   . Difficulty of Paying Living Expenses:   Food Insecurity:   . Worried About Charity fundraiser in the Last Year:   . Arboriculturist in the Last Year:   Transportation Needs:   . Film/video editor (Medical):   Marland Kitchen Lack of Transportation (Non-Medical):   Physical Activity:   . Days of Exercise per Week:   . Minutes of Exercise per Session:   Stress:   . Feeling of Stress :   Social Connections:   . Frequency of Communication with Friends and Family:   . Frequency of Social Gatherings with Friends and Family:   . Attends Religious Services:   . Active Member of Clubs or Organizations:   . Attends Archivist Meetings:   Marland Kitchen Marital Status:     Activities of Daily Living In your present state of health, do you have any difficulty performing the following activities: 06/19/2020  Hearing? N  Vision? N  Comment wears rx glasses  Difficulty concentrating or making decisions? N    Walking or climbing stairs? Y  Comment due SOB with her COPD  Dressing or bathing? N  Doing errands, shopping? N  Preparing Food and eating ? N  Using the Toilet? N  In the past six months, have you accidently leaked urine? Y  Comment takes Mybetriq for this-wears pad  Do you have problems with loss of bowel control? N  Managing your Medications? N  Managing your Finances? N  Housekeeping or managing your Housekeeping? N  Some recent data might be hidden    Patient Education/ Literacy How often do you need to have someone help you when you read instructions, pamphlets, or other written materials from your doctor or pharmacy?: 1 - Never What is the last grade level you completed in school?: 12th grade  Exercise Current Exercise Habits: The patient does not participate in regular exercise at present, Exercise limited by: cardiac condition(s);respiratory conditions(s)  Diet Patient reports consuming 3 meals a day and 0 snack(s) a day Patient reports that her primary diet is: Regular Patient reports that she does have regular access to food.   Depression Screen PHQ 2/9 Scores 05/04/2020 02/16/2020 06/17/2019 06/17/2019 05/25/2019 04/15/2019 12/27/2018  PHQ - 2 Score 0 0 0 0 0 0 0  PHQ- 9 Score 3 - - - - - -     Fall Risk Fall Risk  06/19/2020 02/16/2020 06/17/2019 06/17/2019 05/25/2019  Falls in the past year? 0 0 0 0 0  Number falls in past yr: - - - - 0  Injury with Fall? - - - - 0  Follow up - - - - Falls prevention discussed     Objective:  [name] seemed alert and oriented and she participated appropriately during our telephone visit.  Blood Pressure Weight BMI  BP Readings from Last 3 Encounters:  02/16/20 136/80  12/12/19 120/76  06/06/19 122/74   Wt Readings from Last 3 Encounters:  02/16/20 211 lb 9.6 oz (96 kg)  12/12/19 210 lb (95.3 kg)  06/06/19 212 lb (96.2 kg)   BMI Readings from Last 1 Encounters:  02/16/20 33.64 kg/m    *Unable to obtain current  vital signs, weight, and BMI due to telephone visit type  Hearing/Vision  .  Tamrah did not seem to have difficulty with hearing/understanding during the telephone conversation . Reports that she has had a formal eye exam by an eye care professional within the past year . Reports that she has not had a formal hearing evaluation within the past year *Unable to fully assess hearing and vision during telephone visit type  Cognitive Function: 6CIT Screen 06/19/2020 06/17/2019  What Year? 0 points 0 points  What month? 0 points 0 points  What time? 0 points 0 points  Count back from 20 0 points 0 points  Months in reverse 0 points 0 points  Repeat phrase 0 points 0 points  Total Score 0 0   (Normal:0-7, Significant for Dysfunction: >8)  Normal Cognitive Function Screening: Yes   Immunization & Health Maintenance Record Immunization History  Administered Date(s) Administered  . Influenza Split 08/03/2013  . Influenza,inj,Quad PF,6+ Mos 08/26/2016, 08/12/2017  . Influenza,inj,quad, With Preservative 08/17/2019  . Influenza-Unspecified 08/12/2017, 08/11/2018  . Pneumococcal Conjugate-13 11/04/2007    Health Maintenance  Topic Date Due  . COVID-19 Vaccine (1) Never done  . INFLUENZA VACCINE  06/03/2020  . COLONOSCOPY  02/15/2021 (Originally 10/02/2019)  . TETANUS/TDAP  02/15/2021 (Originally 12/11/1967)  . Hepatitis C Screening  02/15/2021 (Originally 11-May-1951)  . PNA vac Low Risk Adult (2 of 2 - PPSV23) 02/15/2021 (Originally 12/10/2013)  . MAMMOGRAM  11/29/2021  . DEXA SCAN  Completed       Assessment  This is a routine wellness examination for [name].  Health Maintenance: Due or Overdue Health Maintenance Due  Topic Date Due  . COVID-19 Vaccine (1) Never done  . INFLUENZA VACCINE  06/03/2020    [name] does not need a referral for Community Assistance: Care Management:   no Social Work:    no Prescription Assistance:  no Nutrition/Diabetes  Education:  no   Plan:  Personalized Goals Goals Addressed            This Visit's Progress   . Exercise 150 min/wk Moderate Activity        Personalized Health Maintenance & Screening Recommendations  Pneumococcal vaccine  Influenza vaccine Td vaccine  Lung Cancer Screening Recommended: no (Low Dose CT Chest recommended if Age 70-80 years, 30 pack-year currently smoking OR have quit w/in past 15 years) Hepatitis C Screening recommended: no HIV Screening recommended: no  Advanced Directives: Written information was not prepared per patient's request.  Referrals & Orders No orders of the defined types were placed in this encounter.   Follow-up Plan . Follow-up with Loman Brooklyn, FNP as planned . Consider Flu, Pneumovax 23, TDAP vaccines at your next visit with your PCP . Bring your COVID vaccine card in for our records   I have personally reviewed and noted the following in the patient's chart:   . Medical and social history . Use of alcohol, tobacco or illicit drugs  . Current medications and supplements . Functional ability and status . Nutritional status . Physical activity . Advanced directives . List of other physicians . Hospitalizations, surgeries, and ER visits in previous 12 months . Vitals . Screenings to include cognitive, depression, and falls . Referrals and appointments  In addition, I have reviewed and discussed with [name] certain preventive protocols, quality metrics, and best practice recommendations. A written personalized care plan for preventive services as well as general preventive health recommendations is available and can be mailed to the patient at her request.      Nahome Bublitz, Florentina Jenny  G, LPN  [phone]

## 2020-06-19 NOTE — Patient Instructions (Signed)
Preventive Care 71 Years and Older, Female Preventive care refers to lifestyle choices and visits with your health care provider that can promote health and wellness. This includes:  A yearly physical exam. This is also called an annual well check.  Regular dental and eye exams.  Immunizations.  Screening for certain conditions.  Healthy lifestyle choices, such as diet and exercise. What can I expect for my preventive care visit? Physical exam Your health care provider will check:  Height and weight. These may be used to calculate body mass index (BMI), which is a measurement that tells if you are at a healthy weight.  Heart rate and blood pressure.  Your skin for abnormal spots. Counseling Your health care provider may ask you questions about:  Alcohol, tobacco, and drug use.  Emotional well-being.  Home and relationship well-being.  Sexual activity.  Eating habits.  History of falls.  Memory and ability to understand (cognition).  Work and work Statistician.  Pregnancy and menstrual history. What immunizations do I need?  Influenza (flu) vaccine  This is recommended every year. Tetanus, diphtheria, and pertussis (Tdap) vaccine  You may need a Td booster every 10 years. Varicella (chickenpox) vaccine  You may need this vaccine if you have not already been vaccinated. Zoster (shingles) vaccine  You may need this after age 71. Pneumococcal conjugate (PCV13) vaccine  One dose is recommended after age 71. Pneumococcal polysaccharide (PPSV23) vaccine  One dose is recommended after age 71. Measles, mumps, and rubella (MMR) vaccine  You may need at least one dose of MMR if you were born in 1957 or later. You may also need a second dose. Meningococcal conjugate (MenACWY) vaccine  You may need this if you have certain conditions. Hepatitis A vaccine  You may need this if you have certain conditions or if you travel or work in places where you may be exposed  to hepatitis A. Hepatitis B vaccine  You may need this if you have certain conditions or if you travel or work in places where you may be exposed to hepatitis B. Haemophilus influenzae type b (Hib) vaccine  You may need this if you have certain conditions. You may receive vaccines as individual doses or as more than one vaccine together in one shot (combination vaccines). Talk with your health care provider about the risks and benefits of combination vaccines. What tests do I need? Blood tests  Lipid and cholesterol levels. These may be checked every 5 years, or more frequently depending on your overall health.  Hepatitis C test.  Hepatitis B test. Screening  Lung cancer screening. You may have this screening every year starting at age 71 if you have a 30-pack-year history of smoking and currently smoke or have quit within the past 15 years.  Colorectal cancer screening. All adults should have this screening starting at age 71 and continuing until age 71. Your health care provider may recommend screening at age 23 if you are at increased risk. You will have tests every 1-10 years, depending on your results and the type of screening test.  Diabetes screening. This is done by checking your blood sugar (glucose) after you have not eaten for a while (fasting). You may have this done every 1-3 years.  Mammogram. This may be done every 1-2 years. Talk with your health care provider about how often you should have regular mammograms.  BRCA-related cancer screening. This may be done if you have a family history of breast, ovarian, tubal, or peritoneal cancers.  Other tests  Sexually transmitted disease (STD) testing.  Bone density scan. This is done to screen for osteoporosis. You may have this done starting at age 71. Follow these instructions at home: Eating and drinking  Eat a diet that includes fresh fruits and vegetables, whole grains, lean protein, and low-fat dairy products. Limit  your intake of foods with high amounts of sugar, saturated fats, and salt.  Take vitamin and mineral supplements as recommended by your health care provider.  Do not drink alcohol if your health care provider tells you not to drink.  If you drink alcohol: ? Limit how much you have to 0-1 drink a day. ? Be aware of how much alcohol is in your drink. In the [place] one drink equals one 12 oz bottle of beer (355 mL), one 5 oz glass of wine (148 mL), or one 1 oz glass of hard liquor (44 mL). Lifestyle  Take daily care of your teeth and gums.  Stay active. Exercise for at least 30 minutes on 5 or more days each week.  Do not use any products that contain nicotine or tobacco, such as cigarettes, e-cigarettes, and chewing tobacco. If you need help quitting, ask your health care provider.  If you are sexually active, practice safe sex. Use a condom or other form of protection in order to prevent STIs (sexually transmitted infections).  Talk with your health care provider about taking a low-dose aspirin or statin. What's next?  Go to your health care provider once a year for a well check visit.  Ask your health care provider how often you should have your eyes and teeth checked.  Stay up to date on all vaccines. This information is not intended to replace advice given to you by your health care provider. Make sure you discuss any questions you have with your health care provider. Document Revised: 10/14/2018 Document Reviewed: 10/14/2018 Elsevier Patient Education  2020 [employer].

## 2020-06-19 NOTE — Progress Notes (Signed)
Cardiology Office Note Date:  06/20/2020  Patient ID:  Marie, [name] March 02, 1951, MRN [id number] PCP:  Loman Brooklyn, FNP  Cardiologist:  Dr. Bronson Ing Electrophysiologist: Dr. Rayann Heman    Chief Complaint:    6 mo planned f/u  History of Present Illness: [name] is a 69 y.o. female with history of COPD (GOLD III), GERD, HLD, persistent Afib and flutter.  She comes today to be seen for Dr. Rayann Heman.  She last saw him Jan 2020, she was doing well, with rare palpitations since her ablation and her amiodarone was stopped.  There was mention of consideration to stop her a/c given new guidelines given her score of 2 includes female gender.  She was pending/not yet had sleep study to evaluate her snoring.  Felt her prior echos of severe MR likely worsened by the AFib with subsequent echos describing her MR as mild.  I saw her Aug 2020, she was doing very well.  She reported since her last visit had been started on n/c O2 for her COPD, most days all day, this being managed by her PMD.  She reported rare palpitations, maybe 10-15 minutes very infrequently, unchanged from before stopping the amiodarone.  No dizziness, near syncope or syncope, no CP She had not pursued sleep study, stated since being started on O2 she is sleeping much better with no reports of ongoing snoring We revisited anticoagulation, her risk score of 2 (including gender), she did not want to stop a/c Planned for 6 mo visits   I saw her again feb 2021 She is doing well.  Has AFib about once a month, duration always < hr, is fast and makes her feel tired.  Self limited with no clear trigger.  No CP.  She has unchanged baseline SOB with her COPD, tends to be worse in the cold weather, uses her O2 all day and night.  No dizzy spells, near syncope or syncope. She is sedentary, limited byher COPD and bad knees. She denies any bleeding or signs of bleeding, again, does not want to come off a/c She was very happy with  her AFib burden and control, was not interested in medication changes    TODAY She again is doing well.  Aware of her Afib once every couple weeks duration is breif, she just relaxes or lays down and it settles away. No CP, no change in her baseline SOB/DOE No dizzy spells, near syncope or syncope. No bleeding or signs of bleeding   AF Hx PVI/CTI ablation 05/04/18, Dr. Rayann Heman AAD Amiodarone, stopped Jan 2020 post ablation/maintaining SR   Past Medical History:  Diagnosis Date  . COPD (chronic obstructive pulmonary disease) ([place])   . GERD (gastroesophageal reflux disease)   . Hypercholesteremia   . Mild mitral regurgitation   . Mitral valve prolapse    a. dx 1985, not seen on echoes more recently.  . Obesity   . Paroxysmal atrial fibrillation ([facility])   . Paroxysmal atrial flutter ([place])    a. dx 05/2017.    Past Surgical History:  Procedure Laterality Date  . ABDOMINAL HYSTERECTOMY    . ATRIAL FIBRILLATION ABLATION N/A 05/04/2018   Procedure: ATRIAL FIBRILLATION ABLATION;  Surgeon: Thompson Grayer, MD;  Location: [facility];  Service: Cardiovascular;  Laterality: N/A;  . CARDIOVERSION N/A 10/08/2017   Procedure: CARDIOVERSION;  Surgeon: Herminio Commons, MD;  Location[facility];  Service: Cardiovascular;  Laterality: N/A;  . CESAREAN SECTION    . TEE  WITHOUT CARDIOVERSION N/A 05/03/2018   Procedure: TRANSESOPHAGEAL ECHOCARDIOGRAM (TEE);  Surgeon: Fay Records, MD;  Location: [facility] END[facility];  Service: Cardiovascular;  Laterality: N/A;  . TUBAL LIGATION      Current Outpatient Medications  Medication Sig Dispense Refill  . albuterol (PROAIR HFA) 108 (90 Base) MCG/ACT inhaler Inhale 2 puffs into the lungs every 6 (six) hours as needed for wheezing or shortness of breath. 1 Inhaler 11  . albuterol (PROVENTIL) (2.5 MG/3ML) 0.083% nebulizer solution Take 3 mLs (2.5 mg total) by nebulization every 6 (six) hours as needed for wheezing or shortness of breath. 240 mL 1  .  apixaban (ELIQUIS) 5 MG TABS tablet TAKE (1) TABLET TWICE A DAY. (Needs to be seen before next refill) 60 tablet 5  . Cholecalciferol (VITAMIN D3) 5000 UNITS CAPS Take 5,000 Units by mouth daily.     Marland Kitchen ezetimibe (ZETIA) 10 MG tablet Take 1 tablet (10 mg total) by mouth daily. Needs to be seen before next refill 30 tablet 0  . Fluticasone-Umeclidin-Vilant (TRELEGY ELLIPTA) 100-62.5-25 MCG/INH AEPB Inhale 1 puff into the lungs daily. 28 each 5  . guaiFENesin (MUCINEX) 600 MG 12 hr tablet Take 600 mg by mouth every morning.     . metoprolol tartrate (LOPRESSOR) 25 MG tablet Take 1 tablet (25 mg total) by mouth 2 (two) times daily. (Needs to be seen before next refill) 180 tablet 2  . mirabegron ER (MYRBETRIQ) 25 MG TB24 tablet Take 1 tablet (25 mg total) by mouth daily. 30 tablet 5  . montelukast (SINGULAIR) 10 MG tablet Take 1 tablet (10 mg total) by mouth at bedtime. 90 tablet 3  . OXYGEN Inhale 2 L into the lungs continuous.    . pantoprazole (PROTONIX) 40 MG tablet Take 1 tablet (40 mg total) by mouth daily. 90 tablet 3  . valACYclovir (VALTREX) 1000 MG tablet Take 1 tablet (1,000 mg total) by mouth 2 (two) times daily. 20 tablet 0  . VASCEPA 1 g capsule TAKE (2) CAPSULES TWICE DAILY. 120 capsule 5   No current facility-administered medications for this visit.    Allergies:   Penicillins and Dilaudid [hydromorphone hcl]   Social History:  The patient  reports that she quit smoking about 29 years ago. Her smoking use included cigarettes. She has a 20.00 pack-year smoking history. She has never used smokeless tobacco. She reports that she does not drink alcohol and does not use drugs.   Family History:  The patient's family history includes Asthma in her mother; Atrial fibrillation in her brother; Emphysema in her father; Heart attack (age of onset: 29) in her mother; Heart disease in her father; Hypertension in her son.  ROS:  Please see the history of present illness.  All other systems are  reviewed and otherwise negative.   PHYSICAL EXAM:  VS:  BP 118/62   Pulse 70   Ht 5' 6.5" (1.689 m)   Wt 211 lb (95.7 kg)   SpO2 94%   BMI 33.55 kg/m  BMI: Body mass index is 33.55 kg/m. Well nourished, well developed, in no acute distress  HEENT: normocephalic, atraumatic  Neck: no JVD, carotid bruits or masses Cardiac:  RRR; no significant murmurs, no rubs, or gallops Lungs:  Soft scattered rhonchi b/l clear with cough, not wheezing, no rales  Abd: soft, nontender, obese MS: no deformity, age appropriate atrophy Ext: no edema  Skin: warm and dry, no rash Neuro:  No gross deficits appreciated Psych: euthymic mood, full affect  EKG:  Not done today   11/05/2018: TTE Study Conclusions - Left ventricle: The cavity size was normal. There was mild   concentric hypertrophy. Systolic function was normal. The   estimated ejection fraction was in the range of 55% to 60%. Wall   motion was normal; there were no regional wall motion   abnormalities. Features are consistent with a pseudonormal left   ventricular filling pattern, with concomitant abnormal relaxation   and increased filling pressure (grade 2 diastolic dysfunction).   Doppler parameters are consistent with indeterminate ventricular   filling pressure. - Aortic valve: Transvalvular velocity was within the normal range.   There was no stenosis. There was no regurgitation. - Mitral valve: Transvalvular velocity was within the normal range.   There was no evidence for stenosis. There was mild regurgitation. - Right ventricle: The cavity size was normal. Wall thickness was   normal. Systolic function was normal. - Atrial septum: No defect or patent foramen ovale was identified. - Tricuspid valve: There was trivial regurgitation. - Pulmonary arteries: Systolic pressure was within the normal   range. PA peak pressure: 27 mm Hg (S).   05/04/18: EPS/Ablation CONCLUSIONS: 1. Counter clockwise isthmus dependant right  atrial flutter upon presentation.   2. Intracardiac echo reveals a moderate sized left atrium with four separate pulmonary veins without evidence of pulmonary vein stenosis. 3. Successful electrical isolation and anatomical encircling of all four pulmonary veins with radiofrequency current.    4. Cavo-tricuspid isthmus ablation was performed with complete bidirectional isthmus block achieved.  5. No inducible arrhythmias following ablation both on and off of Isuprel 6. No early apparent complications.   Recent Labs: 12/12/2019: BUN 11; Creatinine, Ser 0.89; Hemoglobin 12.4; Platelets 396; Potassium 4.2; Sodium 143 02/27/2020: ALT 11  02/27/2020: Chol/HDL Ratio 5.6; Cholesterol, Total 239; HDL 43; LDL Chol Calc (NIH) 166; Triglycerides 162   CrCl cannot be calculated (Patient's most recent lab result is older than the maximum 21 days allowed.).   Wt Readings from Last 3 Encounters:  06/20/20 211 lb (95.7 kg)  02/16/20 211 lb 9.6 oz (96 kg)  12/12/19 210 lb (95.3 kg)     Other studies reviewed: Additional studies/records reviewed today include: summarized above  ASSESSMENT AND PLAN:  1. Persistent Afib, and flutter     S/p PVI and CTI ablation with Dr. Rayann Heman     CHA2DS2Vasc 2 for female, HTN,  on Eliquis, appropriately dosed, patient elected to continue a/c     BMET/CBC today          She remains happy with her current therapy.  States the frequency for her is very tolerable and never last over an hour in duration She is not interested in adjusting her therapy at this time   2. Diastolic dysfunction     No exam findings to suggest volume OL   3. COPD     Managed with her PMD team     Chronic O2   Disposition: we will continue to see her Q 6 months, sooner if needed   Current medicines are reviewed at length with the patient today.  The patient did not have any concerns regarding medicines.  Venetia Night, PA-C 06/20/2020 2:03 PM     [place] [address] [phone] (office)  [phone] (fax)

## 2020-06-20 ENCOUNTER — Other Ambulatory Visit: Payer: Self-pay

## 2020-06-20 ENCOUNTER — Ambulatory Visit: Payer: Medicare HMO | Admitting: Physician Assistant

## 2020-06-20 VITALS — BP 118/62 | HR 70 | Ht 66.5 in | Wt 211.0 lb

## 2020-06-20 DIAGNOSIS — Z79899 Other long term (current) drug therapy: Secondary | ICD-10-CM

## 2020-06-20 DIAGNOSIS — I4819 Other persistent atrial fibrillation: Secondary | ICD-10-CM

## 2020-06-20 DIAGNOSIS — I5189 Other ill-defined heart diseases: Secondary | ICD-10-CM | POA: Diagnosis not present

## 2020-06-20 NOTE — Patient Instructions (Signed)
Medication Instructions:   Your physician recommends that you continue on your current medications as directed. Please refer to the Current Medication list given to you today.  *If you need a refill on your cardiac medications before your next appointment, please call your pharmacy*   Lab Work:   BMET AND CBC TODAY    If you have labs (blood work) drawn today and your tests are completely normal, you will receive your results only by: . MyChart Message (if you have MyChart) OR . A paper copy in the mail If you have any lab test that is abnormal or we need to change your treatment, we will call you to review the results.   Testing/Procedures: NONE ORDERED  TODAY     Follow-Up: At [facility], you and your health needs are our priority.  As part of our continuing mission to provide you with exceptional heart care, we have created designated Provider Care Teams.  These Care Teams include your primary Cardiologist (physician) and Advanced Practice Providers (APPs -  Physician Assistants and Nurse Practitioners) who all work together to provide you with the care you need, when you need it.  We recommend signing up for the patient portal called "MyChart".  Sign up information is provided on this After Visit Summary.  MyChart is used to connect with patients for Virtual Visits (Telemedicine).  Patients are able to view lab/test results, encounter notes, upcoming appointments, etc.  Non-urgent messages can be sent to your provider as well.   To learn more about what you can do with MyChart, go to [email or web address].    Your next appointment:   6 month(s)  The format for your next appointment:   In Person  Provider:   You may see Dr. Allred  or one of the following Advanced Practice Providers on your designated Care Team:    Amber Seiler, NP  Renee Ursuy, PA-C  Michael "Andy" Tillery, PA-C    Other Instructions   

## 2020-06-21 LAB — BASIC METABOLIC PANEL
BUN/Creatinine Ratio: 12 (ref 12–28)
BUN: 8 mg/dL (ref 8–27)
CO2: 30 mmol/L — ABNORMAL HIGH (ref 20–29)
Calcium: 9.1 mg/dL (ref 8.7–10.3)
Chloride: 101 mmol/L (ref 96–106)
Creatinine, Ser: 0.69 mg/dL (ref 0.57–1.00)
GFR calc Af Amer: 101 mL/min/{1.73_m2} (ref >59)
GFR calc non Af Amer: 88 mL/min/{1.73_m2} (ref >59)
Glucose: 98 mg/dL (ref 65–99)
Potassium: 4.6 mmol/L (ref 3.5–5.2)
Sodium: 140 mmol/L (ref 134–144)

## 2020-06-21 LAB — CBC
Hematocrit: 35.1 % (ref 34.0–46.6)
Hemoglobin: 11.9 g/dL (ref 11.1–15.9)
MCH: 29.9 pg (ref 26.6–33.0)
MCHC: 33.9 g/dL (ref 31.5–35.7)
MCV: 88 fL (ref 79–97)
Platelets: 338 10*3/uL (ref 150–450)
RBC: 3.98 x10E6/uL (ref 3.77–5.28)
RDW: 13 % (ref 11.7–15.4)
WBC: 6.9 10*3/uL (ref 3.4–10.8)

## 2020-06-28 DIAGNOSIS — Z20828 Contact with and (suspected) exposure to other viral communicable diseases: Secondary | ICD-10-CM | POA: Diagnosis not present

## 2020-07-10 DIAGNOSIS — J449 Chronic obstructive pulmonary disease, unspecified: Secondary | ICD-10-CM | POA: Diagnosis not present

## 2020-07-13 ENCOUNTER — Ambulatory Visit: Payer: Medicare HMO | Admitting: Licensed Clinical Social Worker

## 2020-07-13 DIAGNOSIS — E7800 Pure hypercholesterolemia, unspecified: Secondary | ICD-10-CM

## 2020-07-13 DIAGNOSIS — J449 Chronic obstructive pulmonary disease, unspecified: Secondary | ICD-10-CM

## 2020-07-13 DIAGNOSIS — N3281 Overactive bladder: Secondary | ICD-10-CM

## 2020-07-13 DIAGNOSIS — I4891 Unspecified atrial fibrillation: Secondary | ICD-10-CM

## 2020-07-13 NOTE — Chronic Care Management (AMB) (Signed)
  Chronic Care Management    Clinical Social Work Follow Up Note  07/13/2020 Name: [name] MRN: [id number] DOB: 10-26-1951  [name] is a 69 y.o. year old female who is a primary care patient of Marie Brooklyn, FNP. The CCM team was consulted for assistance with [employer] .   Review of patient status, including review of consultants reports, other relevant assessments, and collaboration with appropriate care team members and the patient's provider was performed as part of comprehensive patient evaluation and provision of chronic care management services.    SDOH (Social Determinants of Health) assessments performed: No;risk for tobacco use; risk for depression; risk for stress; risk for physical inactivity    Chronic Care Management from 05/04/2020 in [place]  PHQ-9 Total Score 3       GAD 7 : Generalized Anxiety Score 05/04/2020  Nervous, Anxious, on Edge 0  Control/stop worrying 0  Worry too much - different things 0  Trouble relaxing 0  Restless 1  Easily annoyed or irritable 0  Afraid - awful might happen 0  Total GAD 7 Score 1  Anxiety Difficulty Somewhat difficult    Outpatient Encounter Medications as of 07/13/2020  Medication Sig  . albuterol (PROAIR HFA) 108 (90 Base) MCG/ACT inhaler Inhale 2 puffs into the lungs every 6 (six) hours as needed for wheezing or shortness of breath.  Marland Kitchen albuterol (PROVENTIL) (2.5 MG/3ML) 0.083% nebulizer solution Take 3 mLs (2.5 mg total) by nebulization every 6 (six) hours as needed for wheezing or shortness of breath.  Marland Kitchen apixaban (ELIQUIS) 5 MG TABS tablet TAKE (1) TABLET TWICE A DAY. (Needs to be seen before next refill)  . Cholecalciferol (VITAMIN D3) 5000 UNITS CAPS Take 5,000 Units by mouth daily.   Marland Kitchen ezetimibe (ZETIA) 10 MG tablet Take 1 tablet (10 mg total) by mouth daily. Needs to be seen before next refill  . Fluticasone-Umeclidin-Vilant (TRELEGY ELLIPTA) 100-62.5-25 MCG/INH AEPB Inhale  1 puff into the lungs daily.  Marland Kitchen guaiFENesin (MUCINEX) 600 MG 12 hr tablet Take 600 mg by mouth every morning.   . metoprolol tartrate (LOPRESSOR) 25 MG tablet Take 1 tablet (25 mg total) by mouth 2 (two) times daily. (Needs to be seen before next refill)  . mirabegron ER (MYRBETRIQ) 25 MG TB24 tablet Take 1 tablet (25 mg total) by mouth daily.  . montelukast (SINGULAIR) 10 MG tablet Take 1 tablet (10 mg total) by mouth at bedtime.  . OXYGEN Inhale 2 L into the lungs continuous.  . pantoprazole (PROTONIX) 40 MG tablet Take 1 tablet (40 mg total) by mouth daily.  . valACYclovir (VALTREX) 1000 MG tablet Take 1 tablet (1,000 mg total) by mouth 2 (two) times daily.  Marland Kitchen VASCEPA 1 g capsule TAKE (2) CAPSULES TWICE DAILY.   No facility-administered encounter medications on file as of 07/13/2020.   LCSW called client phone number several times today but LCSW was not able to speak via phone with client today. LCSW did leave phone message for [name] asking her to please return call to LCSW at [phone]  Follow Up Plan: LCSW to call client in next 4 weeks to talk with client about community resources of help to client  Norva Riffle.Quashawn Jewkes MSW, LCSW Licensed Clinical Social Worker [facility] Care Management [phone]

## 2020-07-13 NOTE — Patient Instructions (Addendum)
Licensed Clinical Social Worker Visit Information  Materials Provided: No  07/13/2020  Name: [name]       MRN: [id number]       DOB: [birth year]  [name] is a 69 y.o. year old female who is a primary care patient of Marie Brooklyn, FNP. The CCM team was consulted for assistance with [employer] .   Review of patient status, including review of consultants reports, other relevant assessments, and collaboration with appropriate care team members and the patient's provider was performed as part of comprehensive patient evaluation and provision of chronic care management services.    SDOH (Social Determinants of Health) assessments performed: No;risk for tobacco use; risk for depression; risk for stress; risk for physical inactivity  LCSW called client phone number several times today but LCSW was not able to speak via phone with client today. LCSW did leave phone message for [name] asking her to please return call to LCSW at [phone]  Follow Up Plan: LCSW to call client in next 4 weeks to talk with client about community resources of help to client  LCSW was not able to speak via phone with client today; thus, patient was not able to verbalize understanding of instructions provided today and was not able to accept or decline a print copy of patient instruction materials.   [name].[name] MSW, LCSW Licensed Clinical Social Worker [facility] Care Management [phone]

## 2020-07-18 ENCOUNTER — Other Ambulatory Visit: Payer: Self-pay | Admitting: Family Medicine

## 2020-08-09 DIAGNOSIS — J449 Chronic obstructive pulmonary disease, unspecified: Secondary | ICD-10-CM | POA: Diagnosis not present

## 2020-08-16 ENCOUNTER — Telehealth: Payer: Medicare HMO

## 2020-08-17 ENCOUNTER — Other Ambulatory Visit: Payer: Self-pay | Admitting: Family Medicine

## 2020-08-17 DIAGNOSIS — J449 Chronic obstructive pulmonary disease, unspecified: Secondary | ICD-10-CM

## 2020-08-17 DIAGNOSIS — N3281 Overactive bladder: Secondary | ICD-10-CM

## 2020-08-17 MED ORDER — VASCEPA 1 G PO CAPS: ORAL_CAPSULE | ORAL | 5 refills | Status: DC

## 2020-08-17 NOTE — Addendum Note (Signed)
Addended by: Antonietta Barcelona D on: 08/17/2020 02:43 PM   Modules accepted: Orders

## 2020-08-20 MED ORDER — MIRABEGRON ER 25 MG PO TB24: 25.0000 mg | ORAL_TABLET | Freq: Every day | ORAL | 0 refills | Status: DC

## 2020-08-20 MED ORDER — VASCEPA 1 G PO CAPS: ORAL_CAPSULE | ORAL | 5 refills | Status: DC

## 2020-08-20 MED ORDER — TRELEGY ELLIPTA 100-62.5-25 MCG/INH IN AEPB: 1.0000 | INHALATION_SPRAY | Freq: Every day | RESPIRATORY_TRACT | 0 refills | Status: DC

## 2020-08-20 MED ORDER — EZETIMIBE 10 MG PO TABS
10.0000 mg | ORAL_TABLET | Freq: Every day | ORAL | 0 refills | Status: DC
Start: 2020-08-20 — End: 2020-09-19

## 2020-08-20 NOTE — Telephone Encounter (Signed)
E-prescribe down. resent 

## 2020-08-20 NOTE — Addendum Note (Signed)
Addended by: Antonietta Barcelona D on: 08/20/2020 09:42 AM   Modules accepted: Orders

## 2020-09-09 DIAGNOSIS — J449 Chronic obstructive pulmonary disease, unspecified: Secondary | ICD-10-CM | POA: Diagnosis not present

## 2020-09-17 ENCOUNTER — Telehealth: Payer: Self-pay

## 2020-09-19 ENCOUNTER — Other Ambulatory Visit: Payer: Self-pay | Admitting: Family Medicine

## 2020-09-19 DIAGNOSIS — J449 Chronic obstructive pulmonary disease, unspecified: Secondary | ICD-10-CM

## 2020-09-19 DIAGNOSIS — N3281 Overactive bladder: Secondary | ICD-10-CM

## 2020-09-19 DIAGNOSIS — I4819 Other persistent atrial fibrillation: Secondary | ICD-10-CM

## 2020-09-21 ENCOUNTER — Telehealth: Payer: Medicare HMO

## 2020-09-25 ENCOUNTER — Ambulatory Visit (INDEPENDENT_AMBULATORY_CARE_PROVIDER_SITE_OTHER): Payer: Medicare HMO | Admitting: Nurse Practitioner

## 2020-09-25 ENCOUNTER — Encounter: Payer: Self-pay | Admitting: Nurse Practitioner

## 2020-09-25 ENCOUNTER — Other Ambulatory Visit: Payer: Self-pay

## 2020-09-25 VITALS — BP 126/82 | HR 79 | Temp 97.8°F | Ht 66.5 in | Wt 216.6 lb

## 2020-09-25 DIAGNOSIS — E7800 Pure hypercholesterolemia, unspecified: Secondary | ICD-10-CM | POA: Diagnosis not present

## 2020-09-25 DIAGNOSIS — Q383 Other congenital malformations of tongue: Secondary | ICD-10-CM | POA: Insufficient documentation

## 2020-09-25 DIAGNOSIS — N3281 Overactive bladder: Secondary | ICD-10-CM

## 2020-09-25 DIAGNOSIS — I4819 Other persistent atrial fibrillation: Secondary | ICD-10-CM

## 2020-09-25 MED ORDER — TRIAMCINOLONE ACETONIDE 0.1 % MT PSTE: 1.0000 "application " | PASTE | Freq: Two times a day (BID) | OROMUCOSAL | 12 refills | Status: DC

## 2020-09-25 MED ORDER — PANTOPRAZOLE SODIUM 40 MG PO TBEC: 40.0000 mg | DELAYED_RELEASE_TABLET | Freq: Every day | ORAL | 1 refills | Status: DC

## 2020-09-25 MED ORDER — EZETIMIBE 10 MG PO TABS: 10.0000 mg | ORAL_TABLET | Freq: Every day | ORAL | 1 refills | Status: DC

## 2020-09-25 MED ORDER — MONTELUKAST SODIUM 10 MG PO TABS: ORAL_TABLET | ORAL | 1 refills | Status: DC

## 2020-09-25 MED ORDER — METOPROLOL TARTRATE 25 MG PO TABS: 25.0000 mg | ORAL_TABLET | Freq: Two times a day (BID) | ORAL | 1 refills | Status: DC

## 2020-09-25 MED ORDER — MIRABEGRON ER 25 MG PO TB24: 25.0000 mg | ORAL_TABLET | Freq: Every day | ORAL | 1 refills | Status: DC

## 2020-09-25 MED ORDER — APIXABAN 5 MG PO TABS: ORAL_TABLET | ORAL | 1 refills | Status: DC

## 2020-09-25 MED ORDER — VASCEPA 1 G PO CAPS: ORAL_CAPSULE | ORAL | 5 refills | Status: DC

## 2020-09-25 NOTE — Progress Notes (Signed)
Established Patient Office Visit  Subjective:  Patient ID: [name], female    DOB: 07/25/1951  Age: 69 y.o. MRN: [id number]  CC:  Chief Complaint  Patient presents with   spot on tounge    x 4 weeks- painful    Medical Management of Chronic Issues    check up of chronic medical conditions    HPI [name] presents for  Mixed hyperlipidemia  Pt presents with hyperlipidemia. Patient was diagnosed in 08/14/2013. Compliance with treatment has been good; The patient is compliant with medications, maintains a low cholesterol diet , follows up as directed , and maintains an exercise regimen . The patient denies experiencing any hypercholesterolemia related symptoms.  Current medication Zetia 10 mg tablet daily.   Patient is reporting new bump on the side of her left lateral tongue.  Patient reports bump has been present in the last 3 weeks.  Patient denies pain, fever,  blister or puss.  Patient has used salt gargle with mild effect.     Past Medical History:  Diagnosis Date   COPD (chronic obstructive pulmonary disease) (HCC)    GERD (gastroesophageal reflux disease)    Hypercholesteremia    Mild mitral regurgitation    Mitral valve prolapse    a. dx 1985, not seen on echoes more recently.   Obesity    Paroxysmal atrial fibrillation (HCC)    Paroxysmal atrial flutter ([place])    a. dx 05/2017.    Past Surgical History:  Procedure Laterality Date   ABDOMINAL HYSTERECTOMY     ATRIAL FIBRILLATION ABLATION N/A 05/04/2018   Procedure: ATRIAL FIBRILLATION ABLATION;  Surgeon: Thompson Grayer, MD;  Location: [facility];  Service: Cardiovascular;  Laterality: N/A;   CARDIOVERSION N/A 10/08/2017   Procedure: CARDIOVERSION;  Surgeon: Herminio Commons, MD;  Location: [facility];  Service: Cardiovascular;  Laterality: N/A;   CESAREAN SECTION     TEE WITHOUT CARDIOVERSION N/A 05/03/2018   Procedure: TRANSESOPHAGEAL ECHOCARDIOGRAM (TEE);  Surgeon: Fay Records, MD;  Location: [facility];  Service: Cardiovascular;  Laterality: N/A;   TUBAL LIGATION      Family History  Problem Relation Age of Onset   Heart attack Mother 33   Asthma Mother    Emphysema Father        smoked   Heart disease Father    Atrial fibrillation Brother    Hypertension Son     Social History   Socioeconomic History   Marital status: Married    Spouse name: Alvis   Number of children: 1   Years of education: 12   Highest education level: 12th grade  Occupational History   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   Occupation: [employer]     Comment: retired  Tobacco Use   Smoking status: Former Smoker    Packs/day: 1.00    Years: 20.00    Pack years: 20.00    Types: Cigarettes    Quit date: 08/14/1990    Years since quitting: 30.1   Smokeless tobacco: Never Used  Vaping Use   Vaping Use: Never used  Substance and Sexual Activity   Alcohol use: No    Alcohol/week: 0.0 standard drinks   Drug use: No   Sexual activity: Not Currently  Other Topics Concern   Not on file  Social History Narrative   Not on file   Social Determinants of Health   Financial Resource Strain: Low  Risk    Difficulty of Paying Living Expenses: Not hard at all  Food Insecurity: No Food Insecurity   Worried About [place] in the Last Year: Never true   Ran Out of Food in the Last Year: Never true  Transportation Needs: No Transportation Needs   Lack of Transportation (Medical): No   Lack of Transportation (Non-Medical): No  Physical Activity: Inactive   Days of Exercise per Week: 0 days   Minutes of Exercise per Session: 0 min  Stress: No Stress Concern Present   Feeling of Stress : Not at all  Social Connections: Socially Integrated   Frequency of Communication with Friends and Family: More than three times a week   Frequency of Social Gatherings with Friends and Family: More than three times a  week   Attends Religious Services: More than 4 times per year   Active Member of [employer] or Organizations: Yes   Attends Music therapist: More than 4 times per year   Marital Status: Married  Human resources officer Violence: Not At Risk   Fear of Current or Ex-Partner: No   Emotionally Abused: No   Physically Abused: No   Sexually Abused: No    Outpatient Medications Prior to Visit  Medication Sig Dispense Refill   albuterol (PROAIR HFA) 108 (90 Base) MCG/ACT inhaler Inhale 2 puffs into the lungs every 6 (six) hours as needed for wheezing or shortness of breath. 1 Inhaler 11   albuterol (PROVENTIL) (2.5 MG/3ML) 0.083% nebulizer solution Take 3 mLs (2.5 mg total) by nebulization every 6 (six) hours as needed for wheezing or shortness of breath. 240 mL 1   Cholecalciferol (VITAMIN D3) 5000 UNITS CAPS Take 5,000 Units by mouth daily.      guaiFENesin (MUCINEX) 600 MG 12 hr tablet Take 600 mg by mouth every morning.      OXYGEN Inhale 2 L into the lungs continuous.     TRELEGY ELLIPTA 100-62.5-25 MCG/INH AEPB INHALE 1 PUFF DAILY 60 each 0   valACYclovir (VALTREX) 1000 MG tablet Take 1 tablet (1,000 mg total) by mouth 2 (two) times daily. 20 tablet 0   ELIQUIS 5 MG TABS tablet TAKE (1) TABLET TWICE A DAY. 60 tablet 0   ezetimibe (ZETIA) 10 MG tablet TAKE 1 TABLET DAILY 30 tablet 0   metoprolol tartrate (LOPRESSOR) 25 MG tablet Take 1 tablet (25 mg total) by mouth 2 (two) times daily. (Needs to be seen before next refill) 180 tablet 2   montelukast (SINGULAIR) 10 MG tablet Take 1 tablet (10 mg total) by mouth at bedtime. 90 tablet 3   MYRBETRIQ 25 MG TB24 tablet TAKE 1 TABLET DAILY 30 tablet 0   pantoprazole (PROTONIX) 40 MG tablet Take 1 tablet (40 mg total) by mouth daily. 90 tablet 3   VASCEPA 1 g capsule TAKE (2) CAPSULES TWICE DAILY. 120 capsule 5   No facility-administered medications prior to visit.    Allergies  Allergen Reactions   Penicillins Rash     Has patient had a PCN reaction causing immediate rash, facial/tongue/throat swelling, SOB or lightheadedness with hypotension: Yes Has patient had a PCN reaction causing severe rash involving mucus membranes or skin necrosis: Yes Has patient had a PCN reaction that required hospitalization: No Has patient had a PCN reaction occurring within the last 10 years: No If all of the above answers are "NO", then may proceed with Cephalosporin use.    Dilaudid [Hydromorphone Hcl]     ROS Review of  Systems  Constitutional: Negative for chills and fever.  HENT:       Bump on left lateral tongue  All other systems reviewed and are negative.     Objective:    Physical Exam Vitals reviewed.  HENT:     Head: Normocephalic.     Nose: Nose normal.     Mouth/Throat:     Mouth: Mucous membranes are moist.     Comments: Bump on left lateral tongue Eyes:     Conjunctiva/sclera: Conjunctivae normal.  Cardiovascular:     Rate and Rhythm: Normal rate and regular rhythm.     Pulses: Normal pulses.     Heart sounds: Normal heart sounds.  Pulmonary:     Effort: Pulmonary effort is normal.     Breath sounds: Normal breath sounds.  Abdominal:     General: Bowel sounds are normal.  Skin:    General: Skin is warm.  Neurological:     Mental Status: She is alert and oriented to person, place, and time.  Psychiatric:        Mood and Affect: Mood normal.        Behavior: Behavior normal.     BP 126/82    Pulse 79    Temp 97.8 F (36.6 C) (Temporal)    Ht 5' 6.5" (1.689 m)    Wt 216 lb 9.6 oz (98.2 kg)    SpO2 92%    BMI 34.44 kg/m  Wt Readings from Last 3 Encounters:  09/25/20 216 lb 9.6 oz (98.2 kg)  06/20/20 211 lb (95.7 kg)  02/16/20 211 lb 9.6 oz (96 kg)     There are no preventive care reminders to display for this patient.  There are no preventive care reminders to display for this patient.  Lab Results  Component Value Date   TSH 2.390 06/20/2019   Lab Results  Component  Value Date   WBC 6.9 06/20/2020   HGB 11.9 06/20/2020   HCT 35.1 06/20/2020   MCV 88 06/20/2020   PLT 338 06/20/2020   Lab Results  Component Value Date   NA 140 06/20/2020   K 4.6 06/20/2020   CO2 30 (H) 06/20/2020   GLUCOSE 98 06/20/2020   BUN 8 06/20/2020   CREATININE 0.69 06/20/2020   BILITOT 0.7 02/27/2020   ALKPHOS 113 02/27/2020   AST 17 02/27/2020   ALT 11 02/27/2020   PROT 6.7 02/27/2020   ALBUMIN 4.0 02/27/2020   CALCIUM 9.1 06/20/2020   ANIONGAP 8 05/04/2018   Lab Results  Component Value Date   CHOL 239 (H) 02/27/2020   Lab Results  Component Value Date   HDL 43 02/27/2020   Lab Results  Component Value Date   LDLCALC 166 (H) 02/27/2020   Lab Results  Component Value Date   TRIG 162 (H) 02/27/2020   Lab Results  Component Value Date   CHOLHDL 5.6 (H) 02/27/2020   No results found for: HGBA1C    Assessment & Plan:   Problem List Items Addressed This Visit      Cardiovascular and Mediastinum   Atrial fibrillation (HCC) (Chronic)   Relevant Medications   apixaban (ELIQUIS) 5 MG TABS tablet   metoprolol tartrate (LOPRESSOR) 25 MG tablet   ezetimibe (ZETIA) 10 MG tablet   VASCEPA 1 g capsule     Digestive   Tongue anomaly    Tongue bump left lateral side in the last 3 weeks not well controlled.  Advised patient to rinse with warm salt  water, topical corticosteroid, over-the-counter pain medication like Tylenol.  Provided education to patient with printed handouts given.  Follow-up with worsening or unresolved symptoms.  Rx sent to pharmacy.      Relevant Medications   triamcinolone (KENALOG) 0.1 % paste     Genitourinary   OAB (overactive bladder)   Relevant Medications   mirabegron ER (MYRBETRIQ) 25 MG TB24 tablet     Other   Hypercholesteremia - Primary (Chronic)    Labs completed lipid panel results pending.  Education provided to patient with printed handouts given.  Continue on healthy diet and exercise regimen as tolerated.    Refill sent to pharmacy.      Relevant Medications   apixaban (ELIQUIS) 5 MG TABS tablet   metoprolol tartrate (LOPRESSOR) 25 MG tablet   ezetimibe (ZETIA) 10 MG tablet   VASCEPA 1 g capsule   Other Relevant Orders   Lipid Panel      Meds ordered this encounter  Medications   apixaban (ELIQUIS) 5 MG TABS tablet    Sig: TAKE (1) TABLET TWICE A DAY.    Dispense:  180 tablet    Refill:  1   metoprolol tartrate (LOPRESSOR) 25 MG tablet    Sig: Take 1 tablet (25 mg total) by mouth 2 (two) times daily.    Dispense:  180 tablet    Refill:  1   mirabegron ER (MYRBETRIQ) 25 MG TB24 tablet    Sig: Take 1 tablet (25 mg total) by mouth daily.    Dispense:  90 tablet    Refill:  1   ezetimibe (ZETIA) 10 MG tablet    Sig: Take 1 tablet (10 mg total) by mouth daily.    Dispense:  90 tablet    Refill:  1   montelukast (SINGULAIR) 10 MG tablet    Sig: Take 1 tablet (10 mg total) by mouth at bedtime.    Dispense:  90 tablet    Refill:  1   pantoprazole (PROTONIX) 40 MG tablet    Sig: Take 1 tablet (40 mg total) by mouth daily.    Dispense:  90 tablet    Refill:  1   VASCEPA 1 g capsule    Sig: TAKE (2) CAPSULES TWICE DAILY.    Dispense:  120 capsule    Refill:  5    Vascepa Brand Name is on formulary for insurance not generic-please dispense brand name due to insurance   triamcinolone (KENALOG) 0.1 % paste    Sig: Use as directed 1 application in the mouth or throat 2 (two) times daily.    Dispense:  5 g    Refill:  12    Order Specific Question:   Supervising Provider    Answer:   Caryl Pina A [[id number]]    Follow-up: Return if symptoms worsen or fail to improve.    Ivy Lynn, NP

## 2020-09-25 NOTE — Assessment & Plan Note (Signed)
Tongue bump left lateral side in the last 3 weeks not well controlled.  Advised patient to rinse with warm salt water, topical corticosteroid, over-the-counter pain medication like Tylenol.  Provided education to patient with printed handouts given.  Follow-up with worsening or unresolved symptoms.  Rx sent to pharmacy.

## 2020-09-25 NOTE — Assessment & Plan Note (Signed)
Labs completed lipid panel results pending.  Education provided to patient with printed handouts given.  Continue on healthy diet and exercise regimen as tolerated.   Refill sent to pharmacy.

## 2020-09-25 NOTE — Patient Instructions (Signed)

## 2020-09-26 LAB — LIPID PANEL
Chol/HDL Ratio: 5.3 ratio — ABNORMAL HIGH (ref 0.0–4.4)
Cholesterol, Total: 210 mg/dL — ABNORMAL HIGH (ref 100–199)
HDL: 40 mg/dL (ref >39)
LDL Chol Calc (NIH): 129 mg/dL — ABNORMAL HIGH (ref 0–99)
Triglycerides: 230 mg/dL — ABNORMAL HIGH (ref 0–149)
VLDL Cholesterol Cal: 41 mg/dL — ABNORMAL HIGH (ref 5–40)

## 2020-09-30 ENCOUNTER — Other Ambulatory Visit: Payer: Self-pay | Admitting: Nurse Practitioner

## 2020-10-04 ENCOUNTER — Ambulatory Visit: Payer: Medicare HMO | Admitting: Pharmacist

## 2020-10-09 DIAGNOSIS — J449 Chronic obstructive pulmonary disease, unspecified: Secondary | ICD-10-CM | POA: Diagnosis not present

## 2020-10-11 ENCOUNTER — Ambulatory Visit: Payer: Medicare HMO | Admitting: Pharmacist

## 2020-10-12 ENCOUNTER — Ambulatory Visit (INDEPENDENT_AMBULATORY_CARE_PROVIDER_SITE_OTHER): Payer: Medicare HMO | Admitting: Pharmacist

## 2020-10-12 ENCOUNTER — Other Ambulatory Visit: Payer: Self-pay

## 2020-10-12 DIAGNOSIS — E119 Type 2 diabetes mellitus without complications: Secondary | ICD-10-CM | POA: Diagnosis not present

## 2020-10-12 MED ORDER — NEXLIZET 180-10 MG PO TABS: 1.0000 | ORAL_TABLET | Freq: Every day | ORAL | 3 refills | Status: DC

## 2020-10-12 NOTE — Progress Notes (Signed)
10/12/2020 Name: [name] MRN: [id number] DOB: [birth year]  HPI:  [name] is a 69 y.o. female patient referred to lipid clinic by PCP. PMH is significant for:   Past Medical History:  Diagnosis Date  . COPD (chronic obstructive pulmonary disease) ([place])   . GERD (gastroesophageal reflux disease)   . Hypercholesteremia   . Mild mitral regurgitation   . Mitral valve prolapse    a. dx 1985, not seen on echoes more recently.  . Obesity   . Paroxysmal atrial fibrillation (HCC)   . Paroxysmal atrial flutter ([place])    a. dx 05/2017.    Current Medications: Current Outpatient Medications on File Prior to Visit  Medication Sig Dispense Refill  . albuterol (PROAIR HFA) 108 (90 Base) MCG/ACT inhaler Inhale 2 puffs into the lungs every 6 (six) hours as needed for wheezing or shortness of breath. 1 Inhaler 11  . albuterol (PROVENTIL) (2.5 MG/3ML) 0.083% nebulizer solution Take 3 mLs (2.5 mg total) by nebulization every 6 (six) hours as needed for wheezing or shortness of breath. 240 mL 1  . apixaban (ELIQUIS) 5 MG TABS tablet TAKE (1) TABLET TWICE A DAY. 180 tablet 1  . Cholecalciferol (VITAMIN D3) 5000 UNITS CAPS Take 5,000 Units by mouth daily.     Marland Kitchen ezetimibe (ZETIA) 10 MG tablet Take 1 tablet (10 mg total) by mouth daily. 90 tablet 1  . guaiFENesin (MUCINEX) 600 MG 12 hr tablet Take 600 mg by mouth every morning.     . metoprolol tartrate (LOPRESSOR) 25 MG tablet Take 1 tablet (25 mg total) by mouth 2 (two) times daily. 180 tablet 1  . mirabegron ER (MYRBETRIQ) 25 MG TB24 tablet Take 1 tablet (25 mg total) by mouth daily. 90 tablet 1  . montelukast (SINGULAIR) 10 MG tablet Take 1 tablet (10 mg total) by mouth at bedtime. 90 tablet 1  . OXYGEN Inhale 2 L into the lungs continuous.    . pantoprazole (PROTONIX) 40 MG tablet Take 1 tablet (40 mg total) by mouth daily. 90 tablet 1  . triamcinolone (KENALOG) 0.1 % paste Use as directed 1 application in the mouth or throat 2 (two)  times daily. 5 g 12  . valACYclovir (VALTREX) 1000 MG tablet Take 1 tablet (1,000 mg total) by mouth 2 (two) times daily. 20 tablet 0  . VASCEPA 1 g capsule TAKE (2) CAPSULES TWICE DAILY. 120 capsule 5   No current facility-administered medications on file prior to visit.    Intolerances: tried/failed multiple statins; doesn't recall the names (not in chart); she is currently on Zetia/vascepa  Risk Factors: cardiac history, obese, HTN  LDL goal: <100  Diet: encourage low fat, low cholesterol diet, mediterranean diet    Exercise: encouraged   Family History: reports family history   Social History: former smoker quit 1991   Labs:     Lipid Panel     Component Value Date/Time   CHOL 210 (H) 09/25/2020 1500   TRIG 230 (H) 09/25/2020 1500   HDL 40 09/25/2020 1500   CHOLHDL 5.3 (H) 09/25/2020 1500   LDLCALC 129 (H) 09/25/2020 1500   LABVLDL 41 (H) 09/25/2020 1500     Allergies  Allergen Reactions  . Penicillins Rash    Has patient had a PCN reaction causing immediate rash, facial/tongue/throat swelling, SOB or lightheadedness with hypotension: Yes Has patient had a PCN reaction causing severe rash involving mucus membranes or skin necrosis: Yes Has patient had a PCN reaction that required  hospitalization: No Has patient had a PCN reaction occurring within the last 10 years: No If all of the above answers are "NO", then may proceed with Cephalosporin use.   . Dilaudid [Hydromorphone Hcl]   . Statins     myopathy    Clinical Atherosclerotic Cardiovascular Disease (ASCVD): Yes   The 10-year ASCVD risk score Mikey Bussing DC Jr., et al., 2013) is: 26%   Values used to calculate the score:     Age: 12 years     Sex: Female     Is Non-Hispanic African American: No     Diabetic: Yes     Tobacco smoker: No     Systolic Blood Pressure: 021 mmHg     Is BP treated: Yes     HDL Cholesterol: 40 mg/dL     Total Cholesterol: 210 mg/dL   Assessment/Plan:   1. Hyperlipidemia  - -DISCUSSED TRIAL OF ONCE WEEKLY STATIN --> PATIENT REFUSED  -START NEXLIZET (SAMPLES GIVEN, JUST APPROVED ON INSURANCE) -HOLD ZETIA -CONTINUE VASCEPA -INCREASE EXERCISE, FIBER, FOLLOW HEART HEALTHY DIET    Regina Eck, PharmD, BCPS Clinical Pharmacist, [place]  [facility] Phone [phone]

## 2020-10-15 ENCOUNTER — Other Ambulatory Visit: Payer: Self-pay | Admitting: Family

## 2020-10-15 DIAGNOSIS — J449 Chronic obstructive pulmonary disease, unspecified: Secondary | ICD-10-CM

## 2020-10-16 ENCOUNTER — Telehealth: Payer: Self-pay

## 2020-10-16 NOTE — Telephone Encounter (Signed)
Sent to plan.

## 2020-10-16 NOTE — Telephone Encounter (Signed)
Key: [id number]  Drug Nexlizet 180-10MG  tablets Form [employer] [place] [employer] Part D General Authorization Form

## 2020-10-16 NOTE — Telephone Encounter (Signed)
PA response is now saying "cancelled"  Could someone please follow up on this tomorrow? Let me know if I can help  ICD10: E78.00 Hypercholesteremia

## 2020-10-17 NOTE — Telephone Encounter (Signed)
PA Case: [id number], Status: Approved, Coverage Starts on: 11/04/2019 12:00:00 AM, Coverage Ends on: 11/02/2021 12:00:00 AM. Questions? Contact [phone]    Pharmacy aware.

## 2020-10-17 NOTE — Telephone Encounter (Signed)
PA restarted today for nexlizet. Key: [id number]  Your information has been sent to [facility].

## 2020-10-29 ENCOUNTER — Ambulatory Visit (INDEPENDENT_AMBULATORY_CARE_PROVIDER_SITE_OTHER): Payer: Medicare HMO | Admitting: Family

## 2020-10-29 ENCOUNTER — Other Ambulatory Visit: Payer: Self-pay

## 2020-10-29 ENCOUNTER — Encounter: Payer: Self-pay | Admitting: Family

## 2020-10-29 VITALS — BP 138/84 | HR 71 | Temp 96.2°F | Ht 66.5 in | Wt 216.8 lb

## 2020-10-29 DIAGNOSIS — K148 Other diseases of tongue: Secondary | ICD-10-CM

## 2020-10-29 MED ORDER — TRIAMCINOLONE ACETONIDE 0.1 % MT PSTE: 1.0000 "application " | PASTE | Freq: Two times a day (BID) | OROMUCOSAL | 12 refills | Status: DC

## 2020-10-29 NOTE — Patient Instructions (Signed)
Tongue Biopsy A tongue biopsy is a procedure to remove a small piece of tissue from the tongue so that the tongue cells can be examined under a microscope. You may need this procedure if you have an abnormal growth (lesion) in or on your tongue. Tongue growths can be fluid-filled (cysts) or solid masses (tumors). A tongue biopsy can help your health care provider make a diagnosis and plan the right treatment for you. There are several types of tongue biopsy. In this procedure, your health care provider may remove:  A slice of the lesion (incisional biopsy).  The whole lesion (excisional biopsy).  A piece of the lesion (punch biopsy).  A core of the lesion (needle biopsy). Tell a health care provider about:  Any allergies you have.  All medicines you are taking, including vitamins, herbs, eye drops, creams, and over-the-counter medicines.  Any problems you or family members have had with anesthetic medicines.  Any blood disorders you have.  Any surgeries you have had.  Any medical conditions you have.  Whether you are pregnant or may be pregnant. What are the risks? Generally, this is a safe procedure. However, problems may occur, including:  Infection.  Bleeding.  Allergic reactions to medicines.  Swelling.  Severe swelling that interferes with swallowing or breathing. What happens before the procedure?  Follow instructions from your health care provider about eating or drinking restrictions.  Ask your health care provider about: ? Changing or stopping your regular medicines. This is especially important if you are taking diabetes medicines or blood thinners. ? Taking medicines such as aspirin and ibuprofen. These medicines can thin your blood. Do not take these medicines before your procedure if your health care provider instructs you not to.  Plan to have someone take you home from the hospital or clinic.  If you will be going home right after the procedure, plan to  have someone with you for 24 hours. What happens during the procedure?  To lower your risk of infection: ? Your health care team will wash or sanitize their hands. ? Your skin will be washed with soap.   An IV tube may be inserted into one of your veins.  The lesion may be sprayed with numbing medicine.  You will be given one or more of the following: ? A medicine to help you relax (sedative). ? A medicine to numb the area (local anesthetic).  Your tongue may be held steady with a gauze pad.  For an incisional biopsy: ? A slice of tissue will be removed using a surgical knife. ? The edges of the incision may be closed with an absorbable stitch (suture), or the incision may be left open to heal on its own.  For an excisional biopsy: ? An incision will be made around the lesion and the entire lesion will be removed. ? The edges of the incision may be closed with absorbable sutures or the incision may be left open to heal on its own.  For a punch biopsy, a surgical biopsy instrument will be used to remove a piece of the lesion.  For a needle biopsy, a hollow needle will be inserted into the area of the lesion and a core of tissue or cells will be removed.  Your mouth will be rinsed out and pressure may be applied with a gauze pad until bleeding stops. The procedure may vary among health care providers and hospitals. What happens after the procedure?  Your blood pressure, heart rate, breathing rate, and  blood oxygen level will be monitored until the medicines you were given have worn off.  Do not drive for 24 hours if you received a sedative. This information is not intended to replace advice given to you by your health care provider. Make sure you discuss any questions you have with your health care provider. Document Revised: 10/02/2017 Document Reviewed: 11/04/2015 Elsevier Patient Education  2020 [employer].

## 2020-10-29 NOTE — Progress Notes (Signed)
Subjective:    Patient ID: [name], female    DOB: 15-Dec-1950, 69 y.o.   MRN: [id number]  Chief Complaint  Patient presents with  . Follow-up    Sore on tounge rck from visit with JE     HPI Pt presents to the office for a follow up on a  lesion on her tongue. She noticed it about 7-8 weeks ago. She denies any injury, fever, discharge. She was seen on 09/25/20 and was given kenalog 0.1% paste BID that has mildly helped. She thinks it has decreased is size slightly.  She denies any pain unless her tooth or food hits it.    She reports she smoked for 20 years, but quit in 1991. Denies any snuff use.   Review of Systems  All other systems reviewed and are negative.      Objective:   Physical Exam Vitals reviewed.  Constitutional:      General: She is not in acute distress.    Appearance: She is well-developed and well-nourished.  HENT:     Head: Normocephalic and atraumatic.     Right Ear: Tympanic membrane normal.     Left Ear: Tympanic membrane normal.     Mouth/Throat:     Mouth: Oropharynx is clear and moist.     Tongue: Lesions present.      Comments: Ulcer present that is approx 0.4X0.6 mm, no erythemas Eyes:     Pupils: Pupils are equal, round, and reactive to light.  Neck:     Thyroid: No thyromegaly.  Cardiovascular:     Rate and Rhythm: Normal rate and regular rhythm.     Pulses: Intact distal pulses.     Heart sounds: Normal heart sounds. No murmur heard.   Pulmonary:     Effort: Pulmonary effort is normal. No respiratory distress.     Breath sounds: Normal breath sounds. No wheezing.  Abdominal:     General: Bowel sounds are normal. There is no distension.     Palpations: Abdomen is soft.     Tenderness: There is no abdominal tenderness.  Musculoskeletal:        General: No tenderness or edema. Normal range of motion.     Cervical back: Normal range of motion and neck supple.  Skin:    General: Skin is warm and dry.  Neurological:      Mental Status: She is alert and oriented to person, place, and time.     Cranial Nerves: No cranial nerve deficit.     Deep Tendon Reflexes: Reflexes are normal and symmetric.  Psychiatric:        Mood and Affect: Mood and affect normal.        Behavior: Behavior normal.        Thought Content: Thought content normal.        Judgment: Judgment normal.         BP 138/84   Pulse 71   Temp (!) 96.2 F (35.7 C) (Temporal)   Ht 5' 6.5" (1.689 m)   Wt 216 lb 12.8 oz (98.3 kg)   BMI 34.47 kg/m      Assessment & Plan:  [name] comes in today with chief complaint of Follow-up (Sore on tounge rck from visit with JE)   Diagnosis and orders addressed:  1. Tongue lesion Vit B 12 pending  Referral to ENT pending because of ulcer has been there 8 weeks Call if symptoms worsen or do not improve  -  triamcinolone (KENALOG) 0.1 % paste; Use as directed 1 application in the mouth or throat 2 (two) times daily.  Dispense: 5 g; Refill: 12 - Ambulatory referral to ENT - Vitamin B12   Evelina Dun, FNP

## 2020-10-30 ENCOUNTER — Telehealth: Payer: Medicare HMO

## 2020-10-30 LAB — VITAMIN B12: Vitamin B-12: 146 pg/mL — ABNORMAL LOW (ref 232–1245)

## 2020-11-01 DIAGNOSIS — Z1159 Encounter for screening for other viral diseases: Secondary | ICD-10-CM | POA: Diagnosis not present

## 2020-11-05 ENCOUNTER — Ambulatory Visit: Payer: Medicare HMO

## 2020-11-06 ENCOUNTER — Ambulatory Visit (INDEPENDENT_AMBULATORY_CARE_PROVIDER_SITE_OTHER): Payer: Medicare HMO

## 2020-11-06 ENCOUNTER — Other Ambulatory Visit: Payer: Self-pay

## 2020-11-06 DIAGNOSIS — E538 Deficiency of other specified B group vitamins: Secondary | ICD-10-CM | POA: Diagnosis not present

## 2020-11-06 MED ORDER — CYANOCOBALAMIN 1000 MCG/ML IJ SOLN
1000.0000 ug | Freq: Once | INTRAMUSCULAR | Status: AC
  Administered 2020-11-06: 1000 ug via INTRAMUSCULAR

## 2020-11-06 NOTE — Progress Notes (Signed)
Cyanocobalamin injection given to right deltoid.  Patient tolerated well. 

## 2020-11-07 ENCOUNTER — Ambulatory Visit: Payer: Medicare HMO

## 2020-11-07 ENCOUNTER — Ambulatory Visit (INDEPENDENT_AMBULATORY_CARE_PROVIDER_SITE_OTHER): Payer: Medicare HMO

## 2020-11-07 DIAGNOSIS — E538 Deficiency of other specified B group vitamins: Secondary | ICD-10-CM

## 2020-11-07 MED ORDER — CYANOCOBALAMIN 1000 MCG/ML IJ SOLN
1000.0000 ug | Freq: Once | INTRAMUSCULAR | Status: AC
  Administered 2020-11-07: 1000 ug via INTRAMUSCULAR

## 2020-11-08 ENCOUNTER — Telehealth: Payer: Medicare HMO

## 2020-11-08 ENCOUNTER — Ambulatory Visit (INDEPENDENT_AMBULATORY_CARE_PROVIDER_SITE_OTHER): Payer: Medicare HMO

## 2020-11-08 ENCOUNTER — Other Ambulatory Visit: Payer: Self-pay

## 2020-11-08 DIAGNOSIS — E538 Deficiency of other specified B group vitamins: Secondary | ICD-10-CM

## 2020-11-08 MED ORDER — CYANOCOBALAMIN 1000 MCG/ML IJ SOLN
1000.0000 ug | Freq: Once | INTRAMUSCULAR | Status: AC
  Administered 2020-11-08 – 2020-11-09 (×2): 1000 ug via INTRAMUSCULAR

## 2020-11-08 NOTE — Addendum Note (Signed)
Addended by: Lorelee Cover C on: 11/08/2020 10:17 AM   Modules accepted: Orders

## 2020-11-08 NOTE — Progress Notes (Signed)
Patient came in for b12 and states that she coughed up some blood last night after eating cheese doodles.  States that it only happened one time and now she is fine.  She is concerned please advise.  B12 given and tolerated well

## 2020-11-09 ENCOUNTER — Ambulatory Visit (INDEPENDENT_AMBULATORY_CARE_PROVIDER_SITE_OTHER): Payer: Medicare HMO

## 2020-11-09 DIAGNOSIS — E538 Deficiency of other specified B group vitamins: Secondary | ICD-10-CM

## 2020-11-09 DIAGNOSIS — J449 Chronic obstructive pulmonary disease, unspecified: Secondary | ICD-10-CM | POA: Diagnosis not present

## 2020-11-09 NOTE — Progress Notes (Signed)
Pt tolerated B12 injection well. Pt is schedule for 11/16/20.

## 2020-11-16 ENCOUNTER — Other Ambulatory Visit: Payer: Self-pay

## 2020-11-16 ENCOUNTER — Ambulatory Visit (INDEPENDENT_AMBULATORY_CARE_PROVIDER_SITE_OTHER): Payer: Medicare HMO

## 2020-11-16 DIAGNOSIS — E538 Deficiency of other specified B group vitamins: Secondary | ICD-10-CM | POA: Diagnosis not present

## 2020-11-16 MED ORDER — CYANOCOBALAMIN 1000 MCG/ML IJ SOLN
1000.0000 ug | Freq: Once | INTRAMUSCULAR | Status: AC
  Administered 2020-11-16: 1000 ug via INTRAMUSCULAR

## 2020-11-16 NOTE — Progress Notes (Signed)
Cyanocobalamin injection given to right deltoid.  Patient tolerated well. 

## 2020-11-23 ENCOUNTER — Ambulatory Visit (INDEPENDENT_AMBULATORY_CARE_PROVIDER_SITE_OTHER): Payer: Medicare HMO

## 2020-11-23 ENCOUNTER — Other Ambulatory Visit: Payer: Self-pay

## 2020-11-23 DIAGNOSIS — E538 Deficiency of other specified B group vitamins: Secondary | ICD-10-CM | POA: Diagnosis not present

## 2020-11-23 MED ORDER — CYANOCOBALAMIN 1000 MCG/ML IJ SOLN
1000.0000 ug | INTRAMUSCULAR | Status: AC
Start: 2020-11-23 — End: 2025-10-28
  Administered 2020-11-23 – 2021-08-14 (×11): 1000 ug via INTRAMUSCULAR

## 2020-11-23 NOTE — Progress Notes (Signed)
Cyanocobalamin injection given to left deltoid.  Patient tolerated well. 

## 2020-11-30 ENCOUNTER — Other Ambulatory Visit: Payer: Self-pay

## 2020-11-30 ENCOUNTER — Ambulatory Visit (INDEPENDENT_AMBULATORY_CARE_PROVIDER_SITE_OTHER): Payer: Medicare HMO

## 2020-11-30 DIAGNOSIS — E538 Deficiency of other specified B group vitamins: Secondary | ICD-10-CM

## 2020-12-07 ENCOUNTER — Ambulatory Visit (INDEPENDENT_AMBULATORY_CARE_PROVIDER_SITE_OTHER): Payer: Medicare HMO | Admitting: *Deleted

## 2020-12-07 ENCOUNTER — Other Ambulatory Visit: Payer: Self-pay

## 2020-12-07 DIAGNOSIS — E538 Deficiency of other specified B group vitamins: Secondary | ICD-10-CM | POA: Diagnosis not present

## 2020-12-07 NOTE — Progress Notes (Signed)
B12 tolerated well, next month appt made

## 2020-12-10 DIAGNOSIS — J449 Chronic obstructive pulmonary disease, unspecified: Secondary | ICD-10-CM | POA: Diagnosis not present

## 2020-12-11 ENCOUNTER — Ambulatory Visit (INDEPENDENT_AMBULATORY_CARE_PROVIDER_SITE_OTHER): Payer: Medicare HMO | Admitting: Licensed Clinical Social Worker

## 2020-12-11 DIAGNOSIS — J449 Chronic obstructive pulmonary disease, unspecified: Secondary | ICD-10-CM

## 2020-12-11 DIAGNOSIS — E7800 Pure hypercholesterolemia, unspecified: Secondary | ICD-10-CM | POA: Diagnosis not present

## 2020-12-11 DIAGNOSIS — I4819 Other persistent atrial fibrillation: Secondary | ICD-10-CM

## 2020-12-11 NOTE — Patient Instructions (Addendum)
Licensed Clinical Social Worker Visit Information  Goals we discussed today:   .  Client will talk with LCSW in next 30 days about community resources of help to client (pt-stated)        CARE PLAN ENTRY   Current Barriers:   Oxygen use daily for client with Chronic Diagnoses of Obesity, Atrial Fibrillation, COPD, Anemia, Hypercholesteremia  Clinical Social Work Clinical Goal(s):   LCSW will call client in next 4 weeks to talk with client about community resources of help to client  Interventions:  Talked with Katharine Look about lesion on her tongue (she said lesion on tongue is looking better) Talked with Katharine Look about her receiving scheduled B12 injections Talked with client about pain issues of client Talked with client about sleeping challenges of client Talked with client about social support network (spouse is supportive and son is supportive) Talked with [place] about  relaxation techniques of client (watches TV) Talked with client about oxygen use of client Talked with client about upcoming medical appointments Talked with client about transport needs of client Talked with client about her completion of ADLs Talked with client about vision challenges of client (wears glasses) Talked with client about decreased energy level of client Talked with client about medication procurement and costs of her medications Talked with client about her appointment with cardiologist on 12/24/2020. Talked with client about challenges in standing Talked with client about DME (has a tub seat)  Patient Self Care Activities:   Completes ADLs independently Drives car to appointments and to complete errands  Patient Self Care Deficits:   Oxygen use daily (uses 2 liters of oxygen continuously daily)   Initial goal documentation     Follow Up Plan:LCSW to call client in next 4 weeks to talk with client about community resources of help to client  Materials Provided: No  The  patient verbalized understanding of instructions provided today and declined a print copy of patient instruction materials.   Norva Riffle.Jospeh Mangel MSW, LCSW Licensed Clinical Social Worker [facility] [phone]

## 2020-12-11 NOTE — Chronic Care Management (AMB) (Signed)
Chronic Care Management    Clinical Social Work Follow Up Note  12/11/2020 Name: [name] MRN: [id number] DOB: 03/29/1951  [name] is a 70 y.o. year old female who is a primary care patient of Loman Brooklyn, FNP. The CCM team was consulted for assistance with [employer] .   Review of patient status, including review of consultants reports, other relevant assessments, and collaboration with appropriate care team members and the patient's provider was performed as part of comprehensive patient evaluation and provision of chronic care management services.    SDOH (Social Determinants of Health) assessments performed: No; risk for tobacco use; risk for depression; risk for social isolation; risk for stress; risk for physical inactivity  Flowsheet Row Chronic Care Management from 05/04/2020 in [facility]  PHQ-9 Total Score 3     GAD 7 : Generalized Anxiety Score 05/04/2020  Nervous, Anxious, on Edge 0  Control/stop worrying 0  Worry too much - different things 0  Trouble relaxing 0  Restless 1  Easily annoyed or irritable 0  Afraid - awful might happen 0  Total GAD 7 Score 1  Anxiety Difficulty Somewhat difficult    Outpatient Encounter Medications as of 12/11/2020  Medication Sig  . albuterol (PROAIR HFA) 108 (90 Base) MCG/ACT inhaler Inhale 2 puffs into the lungs every 6 (six) hours as needed for wheezing or shortness of breath.  Marland Kitchen albuterol (PROVENTIL) (2.5 MG/3ML) 0.083% nebulizer solution Take 3 mLs (2.5 mg total) by nebulization every 6 (six) hours as needed for wheezing or shortness of breath.  Marland Kitchen apixaban (ELIQUIS) 5 MG TABS tablet TAKE (1) TABLET TWICE A DAY.  Marland Kitchen Bempedoic Acid-Ezetimibe (NEXLIZET) 180-10 MG TABS Take 1 tablet by mouth daily.  . Cholecalciferol (VITAMIN D3) 5000 UNITS CAPS Take 5,000 Units by mouth daily.   Marland Kitchen guaiFENesin (MUCINEX) 600 MG 12 hr tablet Take 600 mg by mouth every morning.   . metoprolol tartrate  (LOPRESSOR) 25 MG tablet Take 1 tablet (25 mg total) by mouth 2 (two) times daily.  . mirabegron ER (MYRBETRIQ) 25 MG TB24 tablet Take 1 tablet (25 mg total) by mouth daily.  . montelukast (SINGULAIR) 10 MG tablet Take 1 tablet (10 mg total) by mouth at bedtime.  . OXYGEN Inhale 2 L into the lungs continuous.  . pantoprazole (PROTONIX) 40 MG tablet Take 1 tablet (40 mg total) by mouth daily.  . TRELEGY ELLIPTA 100-62.5-25 MCG/INH AEPB INHALE 1 PUFF DAILY  . triamcinolone (KENALOG) 0.1 % paste Use as directed 1 application in the mouth or throat 2 (two) times daily.  . valACYclovir (VALTREX) 1000 MG tablet Take 1 tablet (1,000 mg total) by mouth 2 (two) times daily.  Marland Kitchen VASCEPA 1 g capsule TAKE (2) CAPSULES TWICE DAILY.   Facility-Administered Encounter Medications as of 12/11/2020  Medication  . cyanocobalamin ((VITAMIN B-12)) injection 1,000 mcg    Goals    .  Client will talk with LCSW in next 30 days about community resources of help to client (pt-stated)      CARE PLAN ENTRY   Current Barriers:  . Oxygen use daily for client with Chronic Diagnoses of Obesity, Atrial Fibrillation, COPD, Anemia, Hypercholesteremia  Clinical Social Work Clinical Goal(s):  Marland Kitchen LCSW will call client in next 4 weeks to talk with client about community resources of help to client  Interventions:  Talked with [name] about lesion on her tongue (she said lesion on tongue is looking better) Talked with [name] about her receiving  scheduled B12 injections Talked with client about pain issues of client Talked with client about sleeping challenges of client Talked with client about social support network (spouse is supportive and son is supportive) Talked with [name] about  relaxation techniques of client (watches TV) Talked with client about oxygen use of client Talked with client about upcoming medical appointments Talked with client about transport needs of client Talked with client about her completion of  ADLs Talked with client about vision challenges of client (wears glasses) Talked with client about decreased energy level of client Talked with client about medication procurement and costs of her medications Talked with client about her appointment with cardiologist on 12/24/2020. Talked with client about challenges in standing Talked with client about DME (has a tub seat)  Patient Self Care Activities:   Completes ADLs independently Drives car to appointments and to complete errands  Patient Self Care Deficits:   Oxygen use daily (uses 2 liters of oxygen continuously daily)   Initial goal documentation     Follow Up Plan: LCSW to call client in next 4 weeks to talk with client about community resources of help to client  Norva Riffle.Khristy Kalan MSW, LCSW Licensed Clinical Social Worker [facility] [phone]

## 2020-12-23 NOTE — Progress Notes (Signed)
Cardiology Office Note Date:  12/23/2020  Patient ID:  [name], [name] 12-08-1950, MRN [id number] PCP:  Loman Brooklyn, FNP  Cardiologist:  Dr. Bronson Ing Electrophysiologist: Dr. Rayann Heman    Chief Complaint:    Planned f/u  History of Present Illness: [name] is a 71 y.o. female with history of COPD (GOLD III), GERD, HLD, persistent Afib and flutter.  She comes today to be seen for Dr. Rayann Heman.  She last saw him Jan 2020, she was doing well, with rare palpitations since her ablation and her amiodarone was stopped.  There was mention of consideration to stop her a/c given new guidelines given her score of 2 includes female gender.  She was pending/not yet had sleep study to evaluate her snoring.  Felt her prior echos of severe MR likely worsened by the AFib with subsequent echos describing her MR as mild.  I saw her Aug 2020, she was doing very well.  She reported since her last visit had been started on n/c O2 for her COPD, most days all day, this being managed by her PMD.  She reported rare palpitations, maybe 10-15 minutes very infrequently, unchanged from before stopping the amiodarone.  No dizziness, near syncope or syncope, no CP She had not pursued sleep study, stated since being started on O2 she is sleeping much better with no reports of ongoing snoring We revisited anticoagulation, her risk score of 2 (including gender), she did not want to stop a/c Planned for 6 mo visits   I saw her again feb 2021 She is doing well.  Has AFib about once a month, duration always < hr, is fast and makes her feel tired.  Self limited with no clear trigger.  No CP.  She has unchanged baseline SOB with her COPD, tends to be worse in the cold weather, uses her O2 all day and night.  No dizzy spells, near syncope or syncope. She is sedentary, limited byher COPD and bad knees. She denies any bleeding or signs of bleeding, again, does not want to come off a/c She was very happy with her  AFib burden and control, was not interested in medication changes    I saw her 06/20/20 She again is doing well.  Aware of her Afib once every couple weeks duration is breif, she just relaxes or lays down and it settles away. No CP, no change in her baseline SOB/DOE No dizzy spells, near syncope or syncope. No bleeding or signs of bleeding She did not want to make any adjustments to her therapy Planned for 22mo visits  TODAY She is doing well Continues to be quite happy with her AFib control. Reports infrequent and brief palpitations, longest would be 10-9minutes.   No CP, no dizzy spells, near syncope or syncope. She has some baseline SOB/DOE, wears O2 and is at her baseline She comes today without her )2, the tank to cumbersome for her to "drag around", RA O2 sat 91-84%  No bleeding or signs of bleeding  B12 is helping her tongue, gets an injection and lab next week    AF Hx PVI/CTI ablation 05/04/18, Dr. Rayann Heman AAD Amiodarone, stopped Jan 2020 post ablation/maintaining SR   Past Medical History:  Diagnosis Date  . COPD (chronic obstructive pulmonary disease) ([place])   . GERD (gastroesophageal reflux disease)   . Hypercholesteremia   . Mild mitral regurgitation   . Mitral valve prolapse    a. dx 1985, not seen on echoes more recently.  Marland Kitchen  Obesity   . Paroxysmal atrial fibrillation ([facility])   . Paroxysmal atrial flutter ([place])    a. dx 05/2017.    Past Surgical History:  Procedure Laterality Date  . ABDOMINAL HYSTERECTOMY    . ATRIAL FIBRILLATION ABLATION N/A 05/04/2018   Procedure: ATRIAL FIBRILLATION ABLATION;  Surgeon: Thompson Grayer, MD;  Location: [facility];  Service: Cardiovascular;  Laterality: N/A;  . CARDIOVERSION N/A 10/08/2017   Procedure: CARDIOVERSION;  Surgeon: Herminio Commons, MD;  Location[facility];  Service: Cardiovascular;  Laterality: N/A;  . CESAREAN SECTION    . TEE WITHOUT CARDIOVERSION N/A 05/03/2018   Procedure: TRANSESOPHAGEAL  ECHOCARDIOGRAM (TEE);  Surgeon: Fay Records, MD;  Location: [facility];  Service: Cardiovascular;  Laterality: N/A;  . TUBAL LIGATION      Current Outpatient Medications  Medication Sig Dispense Refill  . albuterol (PROAIR HFA) 108 (90 Base) MCG/ACT inhaler Inhale 2 puffs into the lungs every 6 (six) hours as needed for wheezing or shortness of breath. 1 Inhaler 11  . albuterol (PROVENTIL) (2.5 MG/3ML) 0.083% nebulizer solution Take 3 mLs (2.5 mg total) by nebulization every 6 (six) hours as needed for wheezing or shortness of breath. 240 mL 1  . apixaban (ELIQUIS) 5 MG TABS tablet TAKE (1) TABLET TWICE A DAY. 180 tablet 1  . Bempedoic Acid-Ezetimibe (NEXLIZET) 180-10 MG TABS Take 1 tablet by mouth daily. 90 tablet 3  . Cholecalciferol (VITAMIN D3) 5000 UNITS CAPS Take 5,000 Units by mouth daily.     Marland Kitchen guaiFENesin (MUCINEX) 600 MG 12 hr tablet Take 600 mg by mouth every morning.     . metoprolol tartrate (LOPRESSOR) 25 MG tablet Take 1 tablet (25 mg total) by mouth 2 (two) times daily. 180 tablet 1  . mirabegron ER (MYRBETRIQ) 25 MG TB24 tablet Take 1 tablet (25 mg total) by mouth daily. 90 tablet 1  . montelukast (SINGULAIR) 10 MG tablet Take 1 tablet (10 mg total) by mouth at bedtime. 90 tablet 1  . OXYGEN Inhale 2 L into the lungs continuous.    . pantoprazole (PROTONIX) 40 MG tablet Take 1 tablet (40 mg total) by mouth daily. 90 tablet 1  . TRELEGY ELLIPTA 100-62.5-25 MCG/INH AEPB INHALE 1 PUFF DAILY 60 each 2  . triamcinolone (KENALOG) 0.1 % paste Use as directed 1 application in the mouth or throat 2 (two) times daily. 5 g 12  . valACYclovir (VALTREX) 1000 MG tablet Take 1 tablet (1,000 mg total) by mouth 2 (two) times daily. 20 tablet 0  . VASCEPA 1 g capsule TAKE (2) CAPSULES TWICE DAILY. 120 capsule 5   Current Facility-Administered Medications  Medication Dose Route Frequency Provider Last Rate Last Admin  . cyanocobalamin ((VITAMIN B-12)) injection 1,000 mcg  1,000 mcg  Intramuscular Q30 days Loman Brooklyn, FNP   1,000 mcg at 12/07/20 1032    Allergies:   Penicillins, Dilaudid [hydromorphone hcl], and Statins   Social History:  The patient  reports that she quit smoking about 30 years ago. Her smoking use included cigarettes. She has a 20.00 pack-year smoking history. She has never used smokeless tobacco. She reports that she does not drink alcohol and does not use drugs.   Family History:  The patient's family history includes Asthma in her mother; Atrial fibrillation in her brother; Emphysema in her father; Heart attack (age of onset: 68) in her mother; Heart disease in her father; Hypertension in her son.  ROS:  Please see the history of present illness.  All other systems are reviewed and otherwise negative.   PHYSICAL EXAM:  VS:  There were no vitals taken for this visit. BMI: There is no height or weight on file to calculate BMI. Well nourished, well developed, in no acute distress  HEENT: normocephalic, atraumatic  Neck: no JVD, carotid bruits or masses Cardiac:  RRR; no significant murmurs, no rubs, or gallops Lungs:  Soft scattered rhonchi b/l clear with cough, not wheezing, no rales  Abd: soft, nontender, obese MS: no deformity, age appropriate atrophy Ext: no edema  Skin: warm and dry, no rash Neuro:  No gross deficits appreciated Psych: euthymic mood, full affect     EKG:  Done tdoay and reviewed by myself: SR, 73bpm, no changes   11/05/2018: TTE Study Conclusions - Left ventricle: The cavity size was normal. There was mild   concentric hypertrophy. Systolic function was normal. The   estimated ejection fraction was in the range of 55% to 60%. Wall   motion was normal; there were no regional wall motion   abnormalities. Features are consistent with a pseudonormal left   ventricular filling pattern, with concomitant abnormal relaxation   and increased filling pressure (grade 2 diastolic dysfunction).   Doppler parameters are  consistent with indeterminate ventricular   filling pressure. - Aortic valve: Transvalvular velocity was within the normal range.   There was no stenosis. There was no regurgitation. - Mitral valve: Transvalvular velocity was within the normal range.   There was no evidence for stenosis. There was mild regurgitation. - Right ventricle: The cavity size was normal. Wall thickness was   normal. Systolic function was normal. - Atrial septum: No defect or patent foramen ovale was identified. - Tricuspid valve: There was trivial regurgitation. - Pulmonary arteries: Systolic pressure was within the normal   range. PA peak pressure: 27 mm Hg (S).   05/04/18: EPS/Ablation CONCLUSIONS: 1. Counter clockwise isthmus dependant right atrial flutter upon presentation.   2. Intracardiac echo reveals a moderate sized left atrium with four separate pulmonary veins without evidence of pulmonary vein stenosis. 3. Successful electrical isolation and anatomical encircling of all four pulmonary veins with radiofrequency current.    4. Cavo-tricuspid isthmus ablation was performed with complete bidirectional isthmus block achieved.  5. No inducible arrhythmias following ablation both on and off of Isuprel 6. No early apparent complications.   Recent Labs: 02/27/2020: ALT 11 06/20/2020: BUN 8; Creatinine, Ser 0.69; Hemoglobin 11.9; Platelets 338; Potassium 4.6; Sodium 140  09/25/2020: Chol/HDL Ratio 5.3; Cholesterol, Total 210; HDL 40; LDL Chol Calc (NIH) 129; Triglycerides 230   CrCl cannot be calculated (Patient's most recent lab result is older than the maximum 21 days allowed.).   Wt Readings from Last 3 Encounters:  10/29/20 216 lb 12.8 oz (98.3 kg)  09/25/20 216 lb 9.6 oz (98.2 kg)  06/20/20 211 lb (95.7 kg)     Other studies reviewed: Additional studies/records reviewed today include: summarized above  ASSESSMENT AND PLAN:  1. Persistent Afib, and flutter     S/p PVI and CTI ablation with Dr.  Demetrios Isaacs is 3,  On Eliquis, appropriately dosed          She again remains happy with her current therapy.  NO changes  She ill have labs done next week, will ask them to run lipids as well.   2. Diastolic dysfunction     No exam findings to suggest volume OL   3. COPD     Managed with  her PMD team     Chronic O2   Disposition: we will continue to see her Q 66mo, sooner if needed   Current medicines are reviewed at length with the patient today.  The patient did not have any concerns regarding medicines.  Venetia Night, PA-C 12/23/2020 11:19 AM     [address] [place] [address] [place] [address] [phone] (office)  [phone] (fax)

## 2020-12-24 ENCOUNTER — Ambulatory Visit: Payer: Medicare HMO | Admitting: Physician Assistant

## 2020-12-28 ENCOUNTER — Encounter: Payer: Self-pay | Admitting: Physician Assistant

## 2020-12-28 ENCOUNTER — Ambulatory Visit: Payer: Medicare HMO | Admitting: Physician Assistant

## 2020-12-28 ENCOUNTER — Other Ambulatory Visit: Payer: Self-pay

## 2020-12-28 ENCOUNTER — Encounter: Payer: Self-pay | Admitting: Family Medicine

## 2020-12-28 VITALS — BP 124/66 | HR 73 | Ht 66.5 in | Wt 214.0 lb

## 2020-12-28 DIAGNOSIS — Z79899 Other long term (current) drug therapy: Secondary | ICD-10-CM | POA: Diagnosis not present

## 2020-12-28 DIAGNOSIS — I5189 Other ill-defined heart diseases: Secondary | ICD-10-CM

## 2020-12-28 DIAGNOSIS — I4819 Other persistent atrial fibrillation: Secondary | ICD-10-CM

## 2020-12-28 DIAGNOSIS — E7800 Pure hypercholesterolemia, unspecified: Secondary | ICD-10-CM

## 2020-12-28 NOTE — Patient Instructions (Signed)
Medication Instructions:   Your physician recommends that you continue on your current medications as directed. Please refer to the Current Medication list given to you today.   *If you need a refill on your cardiac medications before your next appointment, please call your pharmacy*   Lab Work: NONE ORDERED  TODAY    If you have labs (blood work) drawn today and your tests are completely normal, you will receive your results only by: . MyChart Message (if you have MyChart) OR . A paper copy in the mail If you have any lab test that is abnormal or we need to change your treatment, we will call you to review the results.   Testing/Procedures: NONE ORDERED  TODAY   Follow-Up: At [facility], you and your health needs are our priority.  As part of our continuing mission to provide you with exceptional heart care, we have created designated Provider Care Teams.  These Care Teams include your primary Cardiologist (physician) and Advanced Practice Providers (APPs -  Physician Assistants and Nurse Practitioners) who all work together to provide you with the care you need, when you need it.  We recommend signing up for the patient portal called "MyChart".  Sign up information is provided on this After Visit Summary.  MyChart is used to connect with patients for Virtual Visits (Telemedicine).  Patients are able to view lab/test results, encounter notes, upcoming appointments, etc.  Non-urgent messages can be sent to your provider as well.   To learn more about what you can do with MyChart, go to [email or web address].    Your next appointment:   6 month(s)  The format for your next appointment:   In Person  Provider:   You may see Dr. Allred  or one of the following Advanced Practice Providers on your designated Care Team:    Amber Seiler, NP  Renee Ursuy, PA-C  Michael "Andy" Tillery, PA-C    Other Instructions   

## 2021-01-07 ENCOUNTER — Other Ambulatory Visit: Payer: Self-pay

## 2021-01-07 ENCOUNTER — Ambulatory Visit (INDEPENDENT_AMBULATORY_CARE_PROVIDER_SITE_OTHER): Payer: Medicare HMO | Admitting: Family Medicine

## 2021-01-07 DIAGNOSIS — J449 Chronic obstructive pulmonary disease, unspecified: Secondary | ICD-10-CM | POA: Diagnosis not present

## 2021-01-07 DIAGNOSIS — E7800 Pure hypercholesterolemia, unspecified: Secondary | ICD-10-CM | POA: Diagnosis not present

## 2021-01-07 DIAGNOSIS — E538 Deficiency of other specified B group vitamins: Secondary | ICD-10-CM | POA: Diagnosis not present

## 2021-01-08 LAB — CMP14+EGFR
ALT: 10 IU/L (ref 0–32)
AST: 18 IU/L (ref 0–40)
Albumin/Globulin Ratio: 1.4 (ref 1.2–2.2)
Albumin: 4 g/dL (ref 3.7–4.7)
Alkaline Phosphatase: 92 IU/L (ref 44–121)
BUN/Creatinine Ratio: 14 (ref 12–28)
BUN: 11 mg/dL (ref 8–27)
Bilirubin Total: 0.8 mg/dL (ref 0.0–1.2)
CO2: 26 mmol/L (ref 20–29)
Calcium: 9.4 mg/dL (ref 8.7–10.3)
Chloride: 99 mmol/L (ref 96–106)
Creatinine, Ser: 0.78 mg/dL (ref 0.57–1.00)
Globulin, Total: 2.8 g/dL (ref 1.5–4.5)
Glucose: 112 mg/dL — ABNORMAL HIGH (ref 65–99)
Potassium: 4.7 mmol/L (ref 3.5–5.2)
Sodium: 143 mmol/L (ref 134–144)
Total Protein: 6.8 g/dL (ref 6.0–8.5)
eGFR: 81 mL/min/{1.73_m2} (ref >59)

## 2021-01-08 LAB — LIPID PANEL
Chol/HDL Ratio: 3.7 ratio (ref 0.0–4.4)
Cholesterol, Total: 137 mg/dL (ref 100–199)
HDL: 37 mg/dL — ABNORMAL LOW (ref >39)
LDL Chol Calc (NIH): 76 mg/dL (ref 0–99)
Triglycerides: 132 mg/dL (ref 0–149)
VLDL Cholesterol Cal: 24 mg/dL (ref 5–40)

## 2021-01-08 LAB — CBC WITH DIFFERENTIAL/PLATELET
Basophils Absolute: 0.1 10*3/uL (ref 0.0–0.2)
Basos: 1 %
EOS (ABSOLUTE): 0.1 10*3/uL (ref 0.0–0.4)
Eos: 2 %
Hematocrit: 33.9 % — ABNORMAL LOW (ref 34.0–46.6)
Hemoglobin: 11.6 g/dL (ref 11.1–15.9)
Immature Grans (Abs): 0 10*3/uL (ref 0.0–0.1)
Immature Granulocytes: 0 %
Lymphocytes Absolute: 1.5 10*3/uL (ref 0.7–3.1)
Lymphs: 18 %
MCH: 30.1 pg (ref 26.6–33.0)
MCHC: 34.2 g/dL (ref 31.5–35.7)
MCV: 88 fL (ref 79–97)
Monocytes Absolute: 0.4 10*3/uL (ref 0.1–0.9)
Monocytes: 5 %
Neutrophils Absolute: 6.4 10*3/uL (ref 1.4–7.0)
Neutrophils: 74 %
Platelets: 474 10*3/uL — ABNORMAL HIGH (ref 150–450)
RBC: 3.85 x10E6/uL (ref 3.77–5.28)
RDW: 11.9 % (ref 11.7–15.4)
WBC: 8.5 10*3/uL (ref 3.4–10.8)

## 2021-01-11 ENCOUNTER — Ambulatory Visit (INDEPENDENT_AMBULATORY_CARE_PROVIDER_SITE_OTHER): Payer: Medicare HMO | Admitting: Licensed Clinical Social Worker

## 2021-01-11 DIAGNOSIS — E7800 Pure hypercholesterolemia, unspecified: Secondary | ICD-10-CM

## 2021-01-11 DIAGNOSIS — N3281 Overactive bladder: Secondary | ICD-10-CM

## 2021-01-11 DIAGNOSIS — I4819 Other persistent atrial fibrillation: Secondary | ICD-10-CM | POA: Diagnosis not present

## 2021-01-11 DIAGNOSIS — J449 Chronic obstructive pulmonary disease, unspecified: Secondary | ICD-10-CM

## 2021-01-11 NOTE — Chronic Care Management (AMB) (Signed)
Chronic Care Management    Clinical Social Work Note  01/11/2021 Name: [name] MRN: [id number] DOB: 01-May-1951  [name] is a 70 y.o. year old female who is a primary care patient of Loman Brooklyn, FNP. The CCM team was consulted to assist the patient with chronic disease management and/or care coordination needs related to: [employer] .   Engaged with patient /son by telephone  in response to provider referral for social work chronic care management and care coordination services.   Consent to Services:  The patient was given information about Chronic Care Management services, agreed to services, and gave verbal consent prior to initiation of services.  Please see initial visit note for detailed documentation.   Patient agreed to services and consent obtained.   Assessment: Review of patient past medical history, allergies, medications, and health status, including review of relevant consultants reports was performed today as part of a comprehensive evaluation and provision of chronic care management and care coordination services.     SDOH (Social Determinants of Health) assessments and interventions performed:  Yes  Advanced Directives Status: See Vynca application for related entries.  Outpatient Encounter Medications as of 01/11/2021  Medication Sig  . albuterol (PROAIR HFA) 108 (90 Base) MCG/ACT inhaler Inhale 2 puffs into the lungs every 6 (six) hours as needed for wheezing or shortness of breath.  Marland Kitchen albuterol (PROVENTIL) (2.5 MG/3ML) 0.083% nebulizer solution Take 3 mLs (2.5 mg total) by nebulization every 6 (six) hours as needed for wheezing or shortness of breath.  Marland Kitchen apixaban (ELIQUIS) 5 MG TABS tablet TAKE (1) TABLET TWICE A DAY.  Marland Kitchen Bempedoic Acid-Ezetimibe (NEXLIZET) 180-10 MG TABS Take 1 tablet by mouth daily.  . Cholecalciferol (VITAMIN D3) 5000 UNITS CAPS Take 5,000 Units by mouth daily.   Marland Kitchen guaiFENesin (MUCINEX) 600 MG 12 hr tablet Take 600 mg by  mouth every morning.   . metoprolol tartrate (LOPRESSOR) 25 MG tablet Take 1 tablet (25 mg total) by mouth 2 (two) times daily.  . mirabegron ER (MYRBETRIQ) 25 MG TB24 tablet Take 1 tablet (25 mg total) by mouth daily.  . montelukast (SINGULAIR) 10 MG tablet Take 1 tablet (10 mg total) by mouth at bedtime.  . OXYGEN Inhale 2 L into the lungs continuous.  . pantoprazole (PROTONIX) 40 MG tablet Take 1 tablet (40 mg total) by mouth daily.  . TRELEGY ELLIPTA 100-62.5-25 MCG/INH AEPB INHALE 1 PUFF DAILY  . triamcinolone (KENALOG) 0.1 % paste Use as directed 1 application in the mouth or throat 2 (two) times daily.  Marland Kitchen VASCEPA 1 g capsule TAKE (2) CAPSULES TWICE DAILY.   Facility-Administered Encounter Medications as of 01/11/2021  Medication  . cyanocobalamin ((VITAMIN B-12)) injection 1,000 mcg   Conditions to be addressed/monitored: community resources needed possibly for DME needs.; difficulty of client in doing ADLs and has breathing challenges;   Care Plan : General Social Work (Adult)  Updates made by Katha Cabal, LCSW since 01/11/2021 12:00 AM    Problem: Barriers to Treatment     Goal: Barriers to Treatment Identified and Managed: needs help with community resources   Start Date: 01/11/2021  Expected End Date: 04/13/2021  This Visit's Progress: On track  Priority: Medium  Note:   Current barriers:   . Patient in need of assistance with connecting to community resources for equipment needs . Use of oxygen in home for breathing assistance . Decreased energy  Clinical Social Work Goal:   Patient will work with SW  in next 30 days to address concerns related to her in home equipment needs Patient will communicate with LCSW  in next 30 days related to her completion of daily ADLs Patient will communicate with LCSW in next 30 days about decreased energy level and in home functioning of client  Clinical Interventions:  . Collaboration with Loman Brooklyn, FNP regarding  development and update of comprehensive plan of care as evidenced by provider attestation and co-signature . Inter-disciplinary care team collaboration (see longitudinal plan of care) . Assessment of needs, barriers  of client   . Talked with Maisie Fus, son about client needs . Talked with Maisie Fus about care needs of client's spouse  Patient Goals/Self-Care Activities: Over the next 30  days . Patient will attend scheduled medical appointments . Patient will communicate as needed with LCSW or RNCM . Patient will take prescribed medications as ordered . Patient will contact community agencies as needed to learn more about DME resources  Follow Up Plan: LCSW to call client/son on 02/12/2021 to assess client needs     Norva Riffle.Anastasha Ortez MSW, LCSW Licensed Clinical Social Worker [facility] Care Management [phone]

## 2021-01-11 NOTE — Patient Instructions (Addendum)
Chronic Care Management   Clinical Social Work Note   01/11/2021   Name: [name] MRN: [id number] DOB: April 04, 1951   [name] is a 70 y.o. year old female who is a primary care patient of Loman Brooklyn, FNP. The CCM team was consulted to assist the patient with chronic disease management and/or care coordination needs related to: [employer] .  Engaged with patient /son by telephone in response to provider referral for social work chronic care management and care coordination services.   Consent to Services:  The patient was given information about Chronic Care Management services, agreed to services, and gave verbal consent prior to initiation of services. Please see initial visit note for detailed documentation.   Patient agreed to services and consent obtained.   Assessment: Review of patient past medical history, allergies, medications, and health status, including review of relevant consultants reports was performed today as part of a comprehensive evaluation and provision of chronic care management and care coordination services.   SDOH (Social Determinants of Health) assessments and interventions performed: Yes   Advanced Directives Status: See Vynca application for related entries.      Outpatient Encounter Medications as of 01/11/2021  Medication Sig  . albuterol (PROAIR HFA) 108 (90 Base) MCG/ACT inhaler Inhale 2 puffs into the lungs every 6 (six) hours as needed for wheezing or shortness of breath.  Marland Kitchen albuterol (PROVENTIL) (2.5 MG/3ML) 0.083% nebulizer solution Take 3 mLs (2.5 mg total) by nebulization every 6 (six) hours as needed for wheezing or shortness of breath.  Marland Kitchen apixaban (ELIQUIS) 5 MG TABS tablet TAKE (1) TABLET TWICE A DAY.  Marland Kitchen Bempedoic Acid-Ezetimibe (NEXLIZET) 180-10 MG TABS Take 1 tablet by mouth daily.  . Cholecalciferol (VITAMIN D3) 5000 UNITS CAPS Take 5,000 Units by mouth daily.   Marland Kitchen guaiFENesin (MUCINEX) 600 MG 12 hr tablet Take 600 mg  by mouth every morning.   . metoprolol tartrate (LOPRESSOR) 25 MG tablet Take 1 tablet (25 mg total) by mouth 2 (two) times daily.  . mirabegron ER (MYRBETRIQ) 25 MG TB24 tablet Take 1 tablet (25 mg total) by mouth daily.  . montelukast (SINGULAIR) 10 MG tablet Take 1 tablet (10 mg total) by mouth at bedtime.  . OXYGEN Inhale 2 L into the lungs continuous.  . pantoprazole (PROTONIX) 40 MG tablet Take 1 tablet (40 mg total) by mouth daily.  . TRELEGY ELLIPTA 100-62.5-25 MCG/INH AEPB INHALE 1 PUFF DAILY  . triamcinolone (KENALOG) 0.1 % paste Use as directed 1 application in the mouth or throat 2 (two) times daily.  Marland Kitchen VASCEPA 1 g capsule TAKE (2) CAPSULES TWICE DAILY.      Facility-Administered Encounter Medications as of 01/11/2021  Medication  . cyanocobalamin ((VITAMIN B-12)) injection 1,000 mcg   Conditions to be addressed/monitored: community resources needed possibly for DME needs.; difficulty of client in doing ADLs and has breathing challenges;       Care Plan : General Social Work (Adult)      Goal: Barriers to Treatment Identified and Managed: needs help with community resources     Start Date: 01/11/2021  Expected End Date: 04/13/2021  This Visit's Progress: On track  Priority: Medium  Note:    Current barriers:  Patient in need of assistance with connecting to community resources for equipment needs  Use of oxygen in home for breathing assistance  Decreased energy  Clinical Social Work Goal:   Patient will work with SW in next 30 days to address concerns related  to her in home equipment needs  Patient will communicate with LCSW in next 30 days related to her completion of daily ADLs  Patient will communicate with LCSW in next 30 days about decreased energy level and in home functioning of client   Clinical Interventions:  Collaboration with Loman Brooklyn, FNP regarding development and update of comprehensive plan of care as evidenced by provider attestation and  co-signature  Inter-disciplinary care team collaboration (see longitudinal plan of care) Assessment of needs, barriers of client  Talked with Maisie Fus, son about client needs  Talked with Maisie Fus about care needs of client's spouse  Patient Goals/Self-Care Activities: Over the next 30 days  Patient will attend scheduled medical appointments  Patient will communicate as needed with LCSW or RNCM  Patient will take prescribed medications as ordered  Patient will contact community agencies as needed to learn more about DME resources  Follow Up Plan: LCSW to call client/son on 02/12/2021 to assess client needs     Norva Riffle.Silas Muff MSW, LCSW Licensed Clinical Social Worker [facility] Care Management [phone]

## 2021-01-17 ENCOUNTER — Encounter: Payer: Self-pay | Admitting: Family Medicine

## 2021-01-17 ENCOUNTER — Other Ambulatory Visit: Payer: Self-pay

## 2021-01-17 ENCOUNTER — Ambulatory Visit (INDEPENDENT_AMBULATORY_CARE_PROVIDER_SITE_OTHER): Payer: Medicare HMO | Admitting: Family Medicine

## 2021-01-17 VITALS — BP 122/72 | HR 71 | Temp 96.5°F | Ht 66.5 in | Wt 215.2 lb

## 2021-01-17 DIAGNOSIS — R8290 Unspecified abnormal findings in urine: Secondary | ICD-10-CM | POA: Diagnosis not present

## 2021-01-17 DIAGNOSIS — I4819 Other persistent atrial fibrillation: Secondary | ICD-10-CM | POA: Diagnosis not present

## 2021-01-17 DIAGNOSIS — M545 Low back pain, unspecified: Secondary | ICD-10-CM

## 2021-01-17 DIAGNOSIS — E538 Deficiency of other specified B group vitamins: Secondary | ICD-10-CM

## 2021-01-17 DIAGNOSIS — I059 Rheumatic mitral valve disease, unspecified: Secondary | ICD-10-CM

## 2021-01-17 DIAGNOSIS — Z1211 Encounter for screening for malignant neoplasm of colon: Secondary | ICD-10-CM

## 2021-01-17 DIAGNOSIS — N3281 Overactive bladder: Secondary | ICD-10-CM

## 2021-01-17 DIAGNOSIS — J449 Chronic obstructive pulmonary disease, unspecified: Secondary | ICD-10-CM | POA: Diagnosis not present

## 2021-01-17 DIAGNOSIS — Q383 Other congenital malformations of tongue: Secondary | ICD-10-CM | POA: Diagnosis not present

## 2021-01-17 DIAGNOSIS — E7800 Pure hypercholesterolemia, unspecified: Secondary | ICD-10-CM

## 2021-01-17 LAB — MICROSCOPIC EXAMINATION

## 2021-01-17 LAB — URINALYSIS, COMPLETE
Bilirubin, UA: NEGATIVE
Glucose, UA: NEGATIVE
Ketones, UA: NEGATIVE
Nitrite, UA: NEGATIVE
Protein,UA: NEGATIVE
Specific Gravity, UA: 1.02 (ref 1.005–1.030)
Urobilinogen, Ur: 0.2 mg/dL (ref 0.2–1.0)
pH, UA: 7 (ref 5.0–7.5)

## 2021-01-17 NOTE — Progress Notes (Signed)
Assessment & Plan:  1. COPD  GOLD III Well controlled on current regimen.   2. Persistent atrial fibrillation ([place]) Managed by cardiology. Well controlled on current regimen.   3. Mitral valve disease Managed by cardiology.   4. Hypercholesteremia Well controlled on current regimen.   5. OAB (overactive bladder) Well controlled on current regimen.   6. Tongue anomaly Almost completely resolved.   7. B12 deficiency Receiving monthly B12 injections.  8. Acute midline low back pain without sciatica - Urinalysis, Complete - Microscopic Examination  9. Abnormal urinalysis Plan to wait for urine culture results before treating possible UTI.  - Urine Culture  10. Colon cancer screening - Cologuard   Return in about 6 months (around 07/20/2021) for annual physical.  Hendricks Limes, MSN, APRN, FNP-C [facility]  Subjective:    Patient ID: [name], female    DOB: 07-30-1951, 70 y.o.   MRN: [id number]  Patient Care Team: Loman Brooklyn, FNP as PCP - General (Family Medicine) Thompson Grayer, MD as Consulting Physician (Cardiology) Shea Evans, Norva Riffle, LCSW as [place] (Licensed Clinical Social Worker) Blanca Friend, Royce Macadamia, [facility] as Pharmacist (Family Medicine)   Chief Complaint:  Chief Complaint  Patient presents with  . COPD    3 month follow up of chronic medical condition  . Back Pain    About a week, middle of lower     HPI: [name] is a 70 y.o. female presenting on 01/17/2021 for COPD (3 month follow up of chronic medical condition) and Back Pain (About a week, middle of lower )  Patient is taking all medications as prescribed.  New complaints: Her concern today is low back pain x1 week. She is not sure if she did something to irritate it. She has mild lower abdominal pain but isn't sure it is related either. She had urinary frequency one night a few days ago.  Social history:  Relevant past  medical, surgical, family and social history reviewed and updated as indicated. Interim medical history since our last visit reviewed.  Allergies and medications reviewed and updated.  DATA REVIEWED: CHART IN EPIC  ROS: Negative unless specifically indicated above in HPI.    Current Outpatient Medications:  .  albuterol (PROAIR HFA) 108 (90 Base) MCG/ACT inhaler, Inhale 2 puffs into the lungs every 6 (six) hours as needed for wheezing or shortness of breath., Disp: 1 Inhaler, Rfl: 11 .  albuterol (PROVENTIL) (2.5 MG/3ML) 0.083% nebulizer solution, Take 3 mLs (2.5 mg total) by nebulization every 6 (six) hours as needed for wheezing or shortness of breath., Disp: 240 mL, Rfl: 1 .  apixaban (ELIQUIS) 5 MG TABS tablet, TAKE (1) TABLET TWICE A DAY., Disp: 180 tablet, Rfl: 1 .  Bempedoic Acid-Ezetimibe (NEXLIZET) 180-10 MG TABS, Take 1 tablet by mouth daily., Disp: 90 tablet, Rfl: 3 .  Cholecalciferol (VITAMIN D3) 5000 UNITS CAPS, Take 5,000 Units by mouth daily. , Disp: , Rfl:  .  guaiFENesin (MUCINEX) 600 MG 12 hr tablet, Take 600 mg by mouth every morning. , Disp: , Rfl:  .  metoprolol tartrate (LOPRESSOR) 25 MG tablet, Take 1 tablet (25 mg total) by mouth 2 (two) times daily., Disp: 180 tablet, Rfl: 1 .  mirabegron ER (MYRBETRIQ) 25 MG TB24 tablet, Take 1 tablet (25 mg total) by mouth daily., Disp: 90 tablet, Rfl: 1 .  montelukast (SINGULAIR) 10 MG tablet, Take 1 tablet (10 mg total) by mouth at bedtime., Disp: 90 tablet,  Rfl: 1 .  OXYGEN, Inhale 2 L into the lungs continuous., Disp: , Rfl:  .  pantoprazole (PROTONIX) 40 MG tablet, Take 1 tablet (40 mg total) by mouth daily., Disp: 90 tablet, Rfl: 1 .  TRELEGY ELLIPTA 100-62.5-25 MCG/INH AEPB, INHALE 1 PUFF DAILY, Disp: 60 each, Rfl: 2 .  triamcinolone (KENALOG) 0.1 % paste, Use as directed 1 application in the mouth or throat 2 (two) times daily., Disp: 5 g, Rfl: 12 .  VASCEPA 1 g capsule, TAKE (2) CAPSULES TWICE DAILY., Disp: 120 capsule, Rfl:  5  Current Facility-Administered Medications:  .  cyanocobalamin ((VITAMIN B-12)) injection 1,000 mcg, 1,000 mcg, Intramuscular, Q30 days, Hendricks Limes F, FNP, 1,000 mcg at 01/07/21 1023   Allergies  Allergen Reactions  . Penicillins Rash    Has patient had a PCN reaction causing immediate rash, facial/tongue/throat swelling, SOB or lightheadedness with hypotension: Yes Has patient had a PCN reaction causing severe rash involving mucus membranes or skin necrosis: Yes Has patient had a PCN reaction that required hospitalization: No Has patient had a PCN reaction occurring within the last 10 years: No If all of the above answers are "NO", then may proceed with Cephalosporin use.   . Dilaudid [Hydromorphone Hcl]   . Statins     myopathy   Past Medical History:  Diagnosis Date  . COPD (chronic obstructive pulmonary disease) ([place])   . GERD (gastroesophageal reflux disease)   . Hypercholesteremia   . Mild mitral regurgitation   . Mitral valve prolapse    a. dx 1985, not seen on echoes more recently.  . Obesity   . Paroxysmal atrial fibrillation ([facility])   . Paroxysmal atrial flutter ([place])    a. dx 05/2017.    Past Surgical History:  Procedure Laterality Date  . ABDOMINAL HYSTERECTOMY    . ATRIAL FIBRILLATION ABLATION N/A 05/04/2018   Procedure: ATRIAL FIBRILLATION ABLATION;  Surgeon: Thompson Grayer, MD;  Location: [facility];  Service: Cardiovascular;  Laterality: N/A;  . CARDIOVERSION N/A 10/08/2017   Procedure: CARDIOVERSION;  Surgeon: Herminio Commons, MD;  Location[facility];  Service: Cardiovascular;  Laterality: N/A;  . CESAREAN SECTION    . TEE WITHOUT CARDIOVERSION N/A 05/03/2018   Procedure: TRANSESOPHAGEAL ECHOCARDIOGRAM (TEE);  Surgeon: Fay Records, MD;  Location: [facility];  Service: Cardiovascular;  Laterality: N/A;  . TUBAL LIGATION      Social History   Socioeconomic History  . Marital status: Married    Spouse name: Alvis  . Number of children:  1  . Years of education: 44  . Highest education level: 12th grade  Occupational History  . 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 Occupation: [employer]     Comment: retired  Tobacco Use  . Smoking status: Former Smoker    Packs/day: 1.00    Years: 20.00    Pack years: 20.00    Types: Cigarettes    Quit date: 08/14/1990    Years since quitting: 30.4  . Smokeless tobacco: Never Used  Vaping Use  . Vaping Use: Never used  Substance and Sexual Activity  . Alcohol use: No    Alcohol/week: 0.0 standard drinks  . Drug use: No  . Sexual activity: Not Currently  Other Topics Concern  . Not on file  Social History Narrative  . Not on file   Social Determinants of Health   Financial Resource Strain: Low Risk   . Difficulty of Paying  Living Expenses: Not hard at all  Food Insecurity: No Food Insecurity  . Worried About Charity fundraiser in the Last Year: Never true  . Ran Out of Food in the Last Year: Never true  Transportation Needs: No Transportation Needs  . Lack of Transportation (Medical): No  . Lack of Transportation (Non-Medical): No  Physical Activity: Inactive  . Days of Exercise per Week: 0 days  . Minutes of Exercise per Session: 0 min  Stress: No Stress Concern Present  . Feeling of Stress : Not at all  Social Connections: Socially Integrated  . Frequency of Communication with Friends and Family: More than three times a week  . Frequency of Social Gatherings with Friends and Family: More than three times a week  . Attends Religious Services: More than 4 times per year  . Active Member of Clubs or Organizations: Yes  . Attends Archivist Meetings: More than 4 times per year  . Marital Status: Married  Human resources officer Violence: Not At Risk  . Fear of Current or Ex-Partner: No  . Emotionally Abused: No  . Physically Abused: No  . Sexually Abused: No        Objective:    BP 122/72   Pulse 71   Temp (!) 96.5 F (35.8  C) (Temporal)   Ht 5' 6.5" (1.689 m)   Wt 215 lb 3.2 oz (97.6 kg)   SpO2 96%   BMI 34.21 kg/m   Wt Readings from Last 3 Encounters:  01/17/21 215 lb 3.2 oz (97.6 kg)  12/28/20 214 lb (97.1 kg)  10/29/20 216 lb 12.8 oz (98.3 kg)    Physical Exam Vitals reviewed.  Constitutional:      General: She is not in acute distress.    Appearance: Normal appearance. She is obese. She is not ill-appearing, toxic-appearing or diaphoretic.  HENT:     Head: Normocephalic and atraumatic.     Mouth/Throat:     Comments: Previous ulceration on tongue almost completely resolved. Eyes:     General: No scleral icterus.       Right eye: No discharge.        Left eye: No discharge.     Conjunctiva/sclera: Conjunctivae normal.  Cardiovascular:     Rate and Rhythm: Normal rate and regular rhythm.     Heart sounds: Normal heart sounds. No murmur heard. No friction rub. No gallop.   Pulmonary:     Effort: Pulmonary effort is normal. No respiratory distress.     Breath sounds: No stridor. Rhonchi present. No wheezing or rales.  Musculoskeletal:        General: Normal range of motion.     Cervical back: Normal range of motion.  Skin:    General: Skin is warm and dry.     Capillary Refill: Capillary refill takes less than 2 seconds.  Neurological:     General: No focal deficit present.     Mental Status: She is alert and oriented to person, place, and time. Mental status is at baseline.  Psychiatric:        Mood and Affect: Mood normal.        Behavior: Behavior normal.        Thought Content: Thought content normal.        Judgment: Judgment normal.     Lab Results  Component Value Date   TSH 2.390 06/20/2019   Lab Results  Component Value Date   WBC 8.5 01/07/2021   HGB 11.6  01/07/2021   HCT 33.9 (L) 01/07/2021   MCV 88 01/07/2021   PLT 474 (H) 01/07/2021   Lab Results  Component Value Date   NA 143 01/07/2021   K 4.7 01/07/2021   CO2 26 01/07/2021   GLUCOSE 112 (H) 01/07/2021    BUN 11 01/07/2021   CREATININE 0.78 01/07/2021   BILITOT 0.8 01/07/2021   ALKPHOS 92 01/07/2021   AST 18 01/07/2021   ALT 10 01/07/2021   PROT 6.8 01/07/2021   ALBUMIN 4.0 01/07/2021   CALCIUM 9.4 01/07/2021   ANIONGAP 8 05/04/2018   Lab Results  Component Value Date   CHOL 137 01/07/2021   Lab Results  Component Value Date   HDL 37 (L) 01/07/2021   Lab Results  Component Value Date   LDLCALC 76 01/07/2021   Lab Results  Component Value Date   TRIG 132 01/07/2021   Lab Results  Component Value Date   CHOLHDL 3.7 01/07/2021   No results found for: HGBA1C

## 2021-01-19 LAB — URINE CULTURE

## 2021-01-23 ENCOUNTER — Other Ambulatory Visit: Payer: Self-pay | Admitting: Family

## 2021-01-23 DIAGNOSIS — J449 Chronic obstructive pulmonary disease, unspecified: Secondary | ICD-10-CM

## 2021-02-03 ENCOUNTER — Other Ambulatory Visit: Payer: Self-pay | Admitting: Family Medicine

## 2021-02-07 DIAGNOSIS — J449 Chronic obstructive pulmonary disease, unspecified: Secondary | ICD-10-CM | POA: Diagnosis not present

## 2021-02-08 ENCOUNTER — Other Ambulatory Visit: Payer: Self-pay

## 2021-02-08 ENCOUNTER — Ambulatory Visit (INDEPENDENT_AMBULATORY_CARE_PROVIDER_SITE_OTHER): Payer: Medicare HMO | Admitting: *Deleted

## 2021-02-08 DIAGNOSIS — E538 Deficiency of other specified B group vitamins: Secondary | ICD-10-CM | POA: Diagnosis not present

## 2021-02-08 NOTE — Progress Notes (Signed)
Pt tolerated B12 well 

## 2021-02-12 ENCOUNTER — Telehealth: Payer: Medicare HMO

## 2021-02-18 ENCOUNTER — Other Ambulatory Visit: Payer: Self-pay | Admitting: Family Medicine

## 2021-02-18 DIAGNOSIS — N3281 Overactive bladder: Secondary | ICD-10-CM

## 2021-02-26 DIAGNOSIS — Z1211 Encounter for screening for malignant neoplasm of colon: Secondary | ICD-10-CM | POA: Diagnosis not present

## 2021-03-04 LAB — COLOGUARD: COLOGUARD: POSITIVE — AB

## 2021-03-05 ENCOUNTER — Other Ambulatory Visit: Payer: Self-pay | Admitting: Family Medicine

## 2021-03-05 DIAGNOSIS — R195 Other fecal abnormalities: Secondary | ICD-10-CM

## 2021-03-05 LAB — COLOGUARD: Cologuard: POSITIVE — AB

## 2021-03-09 DIAGNOSIS — J449 Chronic obstructive pulmonary disease, unspecified: Secondary | ICD-10-CM | POA: Diagnosis not present

## 2021-03-11 ENCOUNTER — Other Ambulatory Visit: Payer: Self-pay

## 2021-03-11 ENCOUNTER — Ambulatory Visit (INDEPENDENT_AMBULATORY_CARE_PROVIDER_SITE_OTHER): Payer: Medicare HMO

## 2021-03-11 DIAGNOSIS — E538 Deficiency of other specified B group vitamins: Secondary | ICD-10-CM | POA: Diagnosis not present

## 2021-03-11 NOTE — Progress Notes (Signed)
Cyanocobalamin injection given to right deltoid.  Patient tolerated well. 

## 2021-03-18 ENCOUNTER — Other Ambulatory Visit: Payer: Self-pay | Admitting: *Deleted

## 2021-03-20 ENCOUNTER — Encounter: Payer: Self-pay | Admitting: Physician Assistant

## 2021-03-22 ENCOUNTER — Telehealth: Payer: Medicare HMO

## 2021-04-09 DIAGNOSIS — J449 Chronic obstructive pulmonary disease, unspecified: Secondary | ICD-10-CM | POA: Diagnosis not present

## 2021-04-11 ENCOUNTER — Ambulatory Visit: Payer: Medicare HMO | Admitting: Physician Assistant

## 2021-04-11 ENCOUNTER — Ambulatory Visit (INDEPENDENT_AMBULATORY_CARE_PROVIDER_SITE_OTHER): Payer: Medicare HMO

## 2021-04-11 ENCOUNTER — Ambulatory Visit: Payer: Medicare HMO

## 2021-04-11 ENCOUNTER — Other Ambulatory Visit: Payer: Self-pay

## 2021-04-11 ENCOUNTER — Encounter: Payer: Self-pay | Admitting: Physician Assistant

## 2021-04-11 VITALS — BP 120/76 | HR 100 | Ht 64.5 in | Wt 211.2 lb

## 2021-04-11 DIAGNOSIS — Z9981 Dependence on supplemental oxygen: Secondary | ICD-10-CM | POA: Diagnosis not present

## 2021-04-11 DIAGNOSIS — E538 Deficiency of other specified B group vitamins: Secondary | ICD-10-CM

## 2021-04-11 DIAGNOSIS — Z7901 Long term (current) use of anticoagulants: Secondary | ICD-10-CM

## 2021-04-11 DIAGNOSIS — R195 Other fecal abnormalities: Secondary | ICD-10-CM | POA: Diagnosis not present

## 2021-04-11 MED ORDER — NA SULFATE-K SULFATE-MG SULF 17.5-3.13-1.6 GM/177ML PO SOLN: 1.0000 | Freq: Once | ORAL | 0 refills | Status: AC

## 2021-04-11 NOTE — Progress Notes (Signed)
Chief Complaint: Positive Cologuard test  HPI:    [name] is a 70 year old female with a past medical history of COPD on 2 L O2 continuously, GERD, A. fib on Eliquis (11/05/2018 echo with LVEF 55-60%) and others listed below, who was referred to me by Loman Brooklyn, FNP for a complaint of positive Cologuard testing.      02/26/2021 positive Cologuard.    Today, the patient presents to clinic and explains that she actually did have a colonoscopy when she was 70 years old and had a few polyps but was told that these were benign and she did not need to repeat for another 10 years.  Explains that now she is on Oxygen 2 L continuously over the past 2 years and was trying to avoid drinking the bowel prep so did the Cologuard with her PCP which turned out positive.  She has not prepared to have colonoscopy.  Denies any GI complaints or concerns.    Denies fever, chills, weight loss, change in bowel habits or blood in her stool.     Past Medical History:  Diagnosis Date   COPD (chronic obstructive pulmonary disease) (HCC)    GERD (gastroesophageal reflux disease)    Hypercholesteremia    Mild mitral regurgitation    Mitral valve prolapse    a. dx 1985, not seen on echoes more recently.   Obesity    Paroxysmal atrial fibrillation (HCC)    Paroxysmal atrial flutter ([place])    a. dx 05/2017.    Past Surgical History:  Procedure Laterality Date   ABDOMINAL HYSTERECTOMY     ATRIAL FIBRILLATION ABLATION N/A 05/04/2018   Procedure: ATRIAL FIBRILLATION ABLATION;  Surgeon: Thompson Grayer, MD;  Location: [facility];  Service: Cardiovascular;  Laterality: N/A;   CARDIOVERSION N/A 10/08/2017   Procedure: CARDIOVERSION;  Surgeon: Herminio Commons, MD;  Location[facility];  Service: Cardiovascular;  Laterality: N/A;   CESAREAN SECTION     TEE WITHOUT CARDIOVERSION N/A 05/03/2018   Procedure: TRANSESOPHAGEAL ECHOCARDIOGRAM (TEE);  Surgeon: Fay Records, MD;  Location: [facility] ENDOS[facility];   Service: Cardiovascular;  Laterality: N/A;   TUBAL LIGATION      Current Outpatient Medications  Medication Sig Dispense Refill   albuterol (PROAIR HFA) 108 (90 Base) MCG/ACT inhaler Inhale 2 puffs into the lungs every 6 (six) hours as needed for wheezing or shortness of breath. 1 Inhaler 11   albuterol (PROVENTIL) (2.5 MG/3ML) 0.083% nebulizer solution Take 3 mLs (2.5 mg total) by nebulization every 6 (six) hours as needed for wheezing or shortness of breath. 240 mL 1   apixaban (ELIQUIS) 5 MG TABS tablet TAKE (1) TABLET TWICE A DAY. 180 tablet 1   Bempedoic Acid-Ezetimibe (NEXLIZET) 180-10 MG TABS Take 1 tablet by mouth daily. 90 tablet 3   Cholecalciferol (VITAMIN D3) 5000 UNITS CAPS Take 5,000 Units by mouth daily.      guaiFENesin (MUCINEX) 600 MG 12 hr tablet Take 600 mg by mouth every morning.      metoprolol tartrate (LOPRESSOR) 25 MG tablet Take 1 tablet (25 mg total) by mouth 2 (two) times daily. 180 tablet 1   montelukast (SINGULAIR) 10 MG tablet Take 1 tablet (10 mg total) by mouth at bedtime. 90 tablet 1   MYRBETRIQ 25 MG TB24 tablet TAKE 1 TABLET ONCE DAILY 30 tablet 2   OXYGEN Inhale 2 L into the lungs continuous.     pantoprazole (PROTONIX) 40 MG tablet Take 1 tablet (40 mg total)  by mouth daily. 90 tablet 1   TRELEGY ELLIPTA 100-62.5-25 MCG/INH AEPB INHALE 1 PUFF DAILY 60 each 2   triamcinolone (KENALOG) 0.1 % paste Use as directed 1 application in the mouth or throat 2 (two) times daily. 5 g 12   VASCEPA 1 g capsule TAKE (2) CAPSULES TWICE DAILY. 120 capsule 2   Current Facility-Administered Medications  Medication Dose Route Frequency Provider Last Rate Last Admin   cyanocobalamin ((VITAMIN B-12)) injection 1,000 mcg  1,000 mcg Intramuscular Q30 days Loman Brooklyn, FNP   1,000 mcg at 03/11/21 1056    Allergies as of 04/11/2021 - Review Complete 01/17/2021  Allergen Reaction Noted   Penicillins Rash    Dilaudid [hydromorphone hcl]  10/02/2018   Statins  10/17/2020     Family History  Problem Relation Age of Onset   Heart attack Mother 46   Asthma Mother    Emphysema Father        smoked   Heart disease Father    Atrial fibrillation Brother    Hypertension Son     Social History   Socioeconomic History   Marital status: Married    Spouse name: Alvis   Number of children: 1   Years of education: 12   Highest education level: 12th grade  Occupational History   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   Occupation: [employer]     Comment: retired  Tobacco Use   Smoking status: Former    Packs/day: 1.00    Years: 20.00    Pack years: 20.00    Types: Cigarettes    Quit date: 08/14/1990    Years since quitting: 30.6   Smokeless tobacco: Never  Vaping Use   Vaping Use: Never used  Substance and Sexual Activity   Alcohol use: No    Alcohol/week: 0.0 standard drinks   Drug use: No   Sexual activity: Not Currently  Other Topics Concern   Not on file  Social History Narrative   Not on file   Social Determinants of Health   Financial Resource Strain: Low Risk    Difficulty of Paying Living Expenses: Not hard at all  Food Insecurity: No Food Insecurity   Worried About Charity fundraiser in the Last Year: Never true   [place] in the Last Year: Never true  Transportation Needs: No Transportation Needs   Lack of Transportation (Medical): No   Lack of Transportation (Non-Medical): No  Physical Activity: Inactive   Days of Exercise per Week: 0 days   Minutes of Exercise per Session: 0 min  Stress: No Stress Concern Present   Feeling of Stress : Not at all  Social Connections: Socially Integrated   Frequency of Communication with Friends and Family: More than three times a week   Frequency of Social Gatherings with Friends and Family: More than three times a week   Attends Religious Services: More than 4 times per year   Active Member of [employer] or Organizations: Yes   Attends Arts administrator: More than 4 times per year   Marital Status: Married  Human resources officer Violence: Not At Risk   Fear of Current or Ex-Partner: No   Emotionally Abused: No   Physically Abused: No   Sexually Abused: No    Review of Systems:    Constitutional: No weight loss, fever or chills Skin: No rash  Cardiovascular: No chest pain   Respiratory: No cough  Gastrointestinal: See HPI and otherwise negative Genitourinary: No dysuria or change in urinary frequency Neurological: No headache, dizziness or syncope Musculoskeletal: No new muscle or joint pain Hematologic: No bleeding  Psychiatric: No history of depression or anxiety   Physical Exam:  Vital signs: BP 120/76 (BP Location: Left Arm, Patient Position: Sitting, Cuff Size: Normal)   Pulse 100   Ht 5' 4.5" (1.638 m) Comment: height measured wihtout shoes  Wt 211 lb 4 oz (95.8 kg)   BMI 35.70 kg/m    Constitutional:   Pleasant Caucasian female appears to be in NAD, Well developed, Well nourished, alert and cooperative Head:  Normocephalic and atraumatic. Eyes:   PEERL, EOMI. No icterus. Conjunctiva pink. Ears:  Normal auditory acuity. Neck:  Supple Throat: Oral cavity and pharynx without inflammation, swelling or lesion.  Respiratory: Respirations even and unlabored. Lungs clear to auscultation bilaterally.   No wheezes, crackles, or rhonchi. +O2 via [place] 2L continuous Cardiovascular: Normal S1, S2. No MRG. Regular rate and rhythm. No peripheral edema, cyanosis or pallor.  Gastrointestinal:  Soft, nondistended, nontender. No rebound or guarding. Normal bowel sounds. No appreciable masses or hepatomegaly. Rectal:  Not performed.  Msk:  Symmetrical without gross deformities. Without edema, no deformity or joint abnormality.  Neurologic:  Alert and  oriented x4;  grossly normal neurologically.  Skin:   Dry and intact without significant lesions or rashes. Psychiatric: Oriented to person, place and time. Demonstrates good judgement and  reason without abnormal affect or behaviors.  RELEVANT LABS AND IMAGING: CBC    Component Value Date/Time   WBC 8.5 01/07/2021 1046   WBC 5.7 05/04/2018 0857   RBC 3.85 01/07/2021 1046   RBC 4.86 05/04/2018 0857   HGB 11.6 01/07/2021 1046   HCT 33.9 (L) 01/07/2021 1046   PLT 474 (H) 01/07/2021 1046   MCV 88 01/07/2021 1046   MCH 30.1 01/07/2021 1046   MCH 29.2 05/04/2018 0857   MCHC 34.2 01/07/2021 1046   MCHC 31.8 05/04/2018 0857   RDW 11.9 01/07/2021 1046   LYMPHSABS 1.5 01/07/2021 1046   MONOABS 0.4 05/04/2018 0857   EOSABS 0.1 01/07/2021 1046   BASOSABS 0.1 01/07/2021 1046    CMP     Component Value Date/Time   NA 143 01/07/2021 1046   K 4.7 01/07/2021 1046   CL 99 01/07/2021 1046   CO2 26 01/07/2021 1046   GLUCOSE 112 (H) 01/07/2021 1046   GLUCOSE 99 05/04/2018 0857   BUN 11 01/07/2021 1046   CREATININE 0.78 01/07/2021 1046   CALCIUM 9.4 01/07/2021 1046   PROT 6.8 01/07/2021 1046   ALBUMIN 4.0 01/07/2021 1046   AST 18 01/07/2021 1046   ALT 10 01/07/2021 1046   ALKPHOS 92 01/07/2021 1046   BILITOT 0.8 01/07/2021 1046   GFRNONAA 88 06/20/2020 1409   GFRAA 101 06/20/2020 1409    Assessment: 1.  Positive Cologuard: In April of this year, history of what sounds like hyperplastic polyps 12 years ago in [place] 2.  Chronic oxygen use 3.  Chronic anticoagulation on Eliquis for CAD  Plan: 1.  Scheduled patient for diagnostic colonoscopy at the hospital given her continuous oxygen use with Dr. Bryan Lemma.  This was scheduled with the first available provider.  Did provide the patient a detailed list of risks for the procedure and she agrees to proceed. 2.  Patient advised to hold her Eliquis for 2 days prior to time of procedure.  We will communicate with her prescribing physician to ensure this is acceptable  for her. 3.  Patient to follow in clinic per recommendations after time of procedure.  Ellouise Newer, PA-C [facility] 04/11/2021, 11:15  AM  Cc: Loman Brooklyn, FNP

## 2021-04-11 NOTE — Patient Instructions (Signed)
You have been scheduled for a colonoscopy. Please follow written instructions given to you at your visit today.  Please pick up your prep supplies at the pharmacy within the next 1-3 days. If you use inhalers (even only as needed), please bring them with you on the day of your procedure.  If you are age 72 or older, your body mass index should be between 23-30. Your Body mass index is 35.7 kg/m. If this is out of the aforementioned range listed, please consider follow up with your Primary Care Provider.  If you are age 62 or younger, your body mass index should be between 19-25. Your Body mass index is 35.7 kg/m. If this is out of the aformentioned range listed, please consider follow up with your Primary Care Provider.   __________________________________________________________  The [facility] providers would like to encourage you to use [facility] to communicate with providers for non-urgent requests or questions.  Due to long hold times on the telephone, sending your provider a message by [facility] may be a faster and more efficient way to get a response.  Please allow 48 business hours for a response.  Please remember that this is for non-urgent requests.

## 2021-04-11 NOTE — Progress Notes (Signed)
B12 injection given to patient and tolerated well.  

## 2021-04-12 NOTE — Progress Notes (Signed)
Agree with the assessment and plan as outlined by Jennifer Lemmon, PA-C. ? ?Donique Hammonds, DO, FACG ? ?

## 2021-04-16 ENCOUNTER — Other Ambulatory Visit: Payer: Self-pay | Admitting: Family Medicine

## 2021-04-16 DIAGNOSIS — I4819 Other persistent atrial fibrillation: Secondary | ICD-10-CM

## 2021-04-22 ENCOUNTER — Other Ambulatory Visit: Payer: Self-pay | Admitting: *Deleted

## 2021-04-22 DIAGNOSIS — I4819 Other persistent atrial fibrillation: Secondary | ICD-10-CM

## 2021-04-22 MED ORDER — METOPROLOL TARTRATE 25 MG PO TABS: 25.0000 mg | ORAL_TABLET | Freq: Two times a day (BID) | ORAL | 0 refills | Status: DC

## 2021-04-27 ENCOUNTER — Other Ambulatory Visit: Payer: Self-pay | Admitting: Family Medicine

## 2021-04-27 DIAGNOSIS — J449 Chronic obstructive pulmonary disease, unspecified: Secondary | ICD-10-CM

## 2021-05-02 ENCOUNTER — Telehealth: Payer: Self-pay | Admitting: *Deleted

## 2021-05-02 NOTE — Telephone Encounter (Signed)
   LESLYN MONDA [birth year] [id number]  Dear Jac Canavan:  We have scheduled the above named patient for a(n) colonoscopy procedure. Our records show that (s)he is on anticoagulation therapy.  Please advise as to whether the patient may come off their therapy of Eliquis 2 days prior to their procedure which is scheduled for Thursday 05/30/21.  Please route your response to Caryl Asp, [place] or fax response to [phone].  Sincerely,   Caryl Asp, [facility]

## 2021-05-03 ENCOUNTER — Ambulatory Visit (INDEPENDENT_AMBULATORY_CARE_PROVIDER_SITE_OTHER): Payer: Medicare HMO | Admitting: Licensed Clinical Social Worker

## 2021-05-03 DIAGNOSIS — E7800 Pure hypercholesterolemia, unspecified: Secondary | ICD-10-CM | POA: Diagnosis not present

## 2021-05-03 DIAGNOSIS — I4891 Unspecified atrial fibrillation: Secondary | ICD-10-CM | POA: Diagnosis not present

## 2021-05-03 DIAGNOSIS — J449 Chronic obstructive pulmonary disease, unspecified: Secondary | ICD-10-CM | POA: Diagnosis not present

## 2021-05-03 DIAGNOSIS — N3281 Overactive bladder: Secondary | ICD-10-CM

## 2021-05-03 NOTE — Patient Instructions (Signed)
Visit Information  PATIENT GOALS:  Goals Addressed             This Visit's Progress    Find Help in My Community: Client needs help or information about community resources       Timeframe:  Short-Term Goal Priority:  Medium Progress: On Track Start Date:   05/03/21                      Expected End Date:  07/30/21                   Follow Up Date N/A client asked to be discharged today from CCM program services   Find Help in My Community (Patient) Client needs help/information about community resources   Why is this important?   Knowing how and where to find help for yourself or family in your neighborhood and community is an important skill.  You will want to take some steps to learn how.   Patient Coping Skills: Has family support from her spouse and from her son Takes medications as prescribed Attends scheduled medical appointments  Patient Deficits: Mobility issues In home care needs  Patient Goals: Over the next 30  days Patient will attend scheduled medical appointments Patient will take prescribed medications as ordered Patient will contact community agencies as needed to learn more about DME resources  Follow Up Plan: N/A Client asked to be discharged today from [facility] program services            Norva Riffle.Cristel Rail MSW, LCSW Licensed Clinical Social Worker [facility] Care Management [phone]

## 2021-05-03 NOTE — Chronic Care Management (AMB) (Signed)
Chronic Care Management    Clinical Social Work Note  05/03/2021 Name: [name] MRN: [id number] DOB: 06/23/1951  [name] is a 70 y.o. year old female who is a primary care patient of Loman Brooklyn, FNP. The CCM team was consulted to assist the patient with chronic disease management and/or care coordination needs related to: [employer] .   Engaged with patient by telephone for follow up visit in response to provider referral for social work chronic care management and care coordination services.   Consent to Services:  The patient was given information about Chronic Care Management services, agreed to services, and gave verbal consent prior to initiation of services.  Please see initial visit note for detailed documentation.   Patient agreed to services and consent obtained.   Assessment: Review of patient past medical history, allergies, medications, and health status, including review of relevant consultants reports was performed today as part of a comprehensive evaluation and provision of chronic care management and care coordination services.     SDOH (Social Determinants of Health) assessments and interventions performed:  SDOH Interventions    Flowsheet Row Most Recent Value  SDOH Interventions   Depression Interventions/Treatment  --  [informed client/son of client of LCSW support and of RNCM support]        Advanced Directives Status: See Vynca application for related entries.  CCM Care Plan  Allergies  Allergen Reactions   Penicillins Rash    Has patient had a PCN reaction causing immediate rash, facial/tongue/throat swelling, SOB or lightheadedness with hypotension: Yes Has patient had a PCN reaction causing severe rash involving mucus membranes or skin necrosis: Yes Has patient had a PCN reaction that required hospitalization: No Has patient had a PCN reaction occurring within the last 10 years: No If all of the above answers are "NO", then  may proceed with Cephalosporin use.    Dilaudid [Hydromorphone Hcl]    Statins     myopathy    Outpatient Encounter Medications as of 05/03/2021  Medication Sig   albuterol (PROAIR HFA) 108 (90 Base) MCG/ACT inhaler Inhale 2 puffs into the lungs every 6 (six) hours as needed for wheezing or shortness of breath.   albuterol (PROVENTIL) (2.5 MG/3ML) 0.083% nebulizer solution Take 3 mLs (2.5 mg total) by nebulization every 6 (six) hours as needed for wheezing or shortness of breath.   Bempedoic Acid-Ezetimibe (NEXLIZET) 180-10 MG TABS Take 1 tablet by mouth daily.   Cholecalciferol (VITAMIN D3) 5000 UNITS CAPS Take 5,000 Units by mouth daily.    Cyanocobalamin (VITAMIN B-12 IJ) Inject 1,000 mcg into the muscle every 30 (thirty) days.   doxycycline (VIBRA-TABS) 100 MG tablet Take 100 mg by mouth 2 (two) times daily.   ELIQUIS 5 MG TABS tablet TAKE (1) TABLET TWICE A DAY.   guaiFENesin (MUCINEX) 600 MG 12 hr tablet Take 600 mg by mouth every morning.    metoprolol tartrate (LOPRESSOR) 25 MG tablet Take 1 tablet (25 mg total) by mouth 2 (two) times daily.   montelukast (SINGULAIR) 10 MG tablet Take 1 tablet (10 mg total) by mouth at bedtime.   MYRBETRIQ 25 MG TB24 tablet TAKE 1 TABLET ONCE DAILY   OXYGEN Inhale 2 L into the lungs continuous.   pantoprazole (PROTONIX) 40 MG tablet Take 1 tablet (40 mg total) by mouth daily.   TRELEGY ELLIPTA 100-62.5-25 MCG/INH AEPB INHALE ONE PUFF EVERY DAY   VASCEPA 1 g capsule TAKE (2) CAPSULES TWICE DAILY.   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   OAB (overactive bladder) 09/26/2019   Lumbar paraspinal muscle spasm 04/13/2018   Lumbosacral radiculopathy 04/13/2018   Atrial fibrillation ([place]) 08/30/2013   Mitral valve disease 08/30/2013   B12 deficiency 08/16/2013   COPD  GOLD III  08/14/2013   Obesity, unspecified 08/14/2013   Hypercholesteremia 08/14/2013    Conditions to be addressed/monitored:Monitor client in home care needs and in home equipment needs   Care Plan : General Social Work (Adult)  Updates made by Katha Cabal, LCSW since 05/03/2021 12:00 AM     Problem: Barriers to Treatment      Goal: Barriers to Treatment Identified and Managed: needs help with community resources   Start Date: 05/03/2021  Expected End Date: 07/30/2021  This Visit's Progress: On track  Recent Progress: On track  Priority: Medium  Note:   Current barriers:   Patient in need of assistance with connecting to community resources for equipment needs Use of oxygen in home for breathing assistance Decreased energy  Clinical Social Work Goal:   Patient will work with SW  in next 30 days to address concerns related to her in home equipment needs Patient will communicate with LCSW  in next 30 days related to her completion of daily ADLs Patient will communicate with LCSW in next 30 days about decreased energy level and in home functioning of client  Clinical Interventions:  Collaboration with Loman Brooklyn, FNP regarding development and update of comprehensive plan of care as evidenced by provider attestation and co-signature Assessment of needs, barriers  of client   Talked with client about her needs Client asked that she be taken off CCM program at this time. She said she is doing well and did not have any needs at present.  She said she did not need CCM nurse to call her for nursing support. Client asked to come off CCM program today LCSW thanked client for call today LCSW informed RNCM of above information  Patient Coping Skills: Has family support from her spouse and from her son Takes medications as prescribed Attends scheduled medical appointments  Patient Deficits: Mobility issues In home care needs  Patient Goals: Over the next 30  days Patient will  attend scheduled medical appointments Patient will communicate as needed with LCSW or RNCM Patient will take prescribed medications as ordered Patient will contact community agencies as needed to learn more about DME resources  Follow Up Plan: Client is discharged today from CCM program services at request of client      Norva Riffle.Adhya Cocco MSW, LCSW Licensed Clinical Social Worker [facility] Care Management [phone]

## 2021-05-09 DIAGNOSIS — J449 Chronic obstructive pulmonary disease, unspecified: Secondary | ICD-10-CM | POA: Diagnosis not present

## 2021-05-10 NOTE — Telephone Encounter (Signed)
Informed patient to hold Eliquis starting 7/26, patient voiced understanding.

## 2021-05-13 ENCOUNTER — Other Ambulatory Visit: Payer: Self-pay

## 2021-05-13 ENCOUNTER — Ambulatory Visit (INDEPENDENT_AMBULATORY_CARE_PROVIDER_SITE_OTHER): Payer: Medicare HMO | Admitting: *Deleted

## 2021-05-13 DIAGNOSIS — E538 Deficiency of other specified B group vitamins: Secondary | ICD-10-CM | POA: Diagnosis not present

## 2021-05-17 ENCOUNTER — Other Ambulatory Visit: Payer: Self-pay | Admitting: Family

## 2021-05-24 ENCOUNTER — Other Ambulatory Visit: Payer: Self-pay

## 2021-05-25 DIAGNOSIS — H00014 Hordeolum externum left upper eyelid: Secondary | ICD-10-CM | POA: Diagnosis not present

## 2021-05-27 ENCOUNTER — Other Ambulatory Visit: Payer: Self-pay | Admitting: *Deleted

## 2021-05-28 MED ORDER — PANTOPRAZOLE SODIUM 40 MG PO TBEC: 40.0000 mg | DELAYED_RELEASE_TABLET | Freq: Every day | ORAL | 1 refills | Status: DC

## 2021-05-29 NOTE — Anesthesia Preprocedure Evaluation (Addendum)
Anesthesia Evaluation  Patient identified by MRN, date of birth, ID band Patient awake    Reviewed: Allergy & Precautions, NPO status , Patient's Chart, lab work & pertinent test results  Airway Mallampati: I  TM Distance: >3 FB Neck ROM: Full    Dental no notable dental hx. (+) Edentulous Upper, Edentulous Lower   Pulmonary COPD (on 2L [place]),  oxygen dependent, former smoker,    Pulmonary exam normal breath sounds clear to auscultation       Cardiovascular Exercise Tolerance: Good Normal cardiovascular exam+ dysrhythmias Atrial Fibrillation  Rhythm:Regular Rate:Normal  11/2018 TTE Left ventricle: The cavity size was normal. There was mild  concentric hypertrophy. Systolic function was normal. The  estimated ejection fraction was in the range of 55% to 60%. Wall  motion was normal; there were no regional wall motion  abnormalities. Features are consistent with a pseudonormal left  ventricular filling pattern, with concomitant abnormal relaxation  and increased filling pressure (grade 2 diastolic dysfunction).  Doppler parameters are consistent with indeterminate ventricular  filling pressure.  - Aortic valve: Transvalvular velocity was within the normal range.  There was no stenosis. There was no regurgitation.  - Mitral valve: Transvalvular velocity was within the normal range.  There was no evidence for stenosis. There was mild regurgitation.  - Right ventricle: The cavity size was normal. Wall thickness was  normal. Systolic function was normal.  - Atrial septum: No defect or patent foramen ovale was identified.  - Tricuspid valve: There was trivial regurgitation.  - Pulmonary arteries: Systolic pressure was within the normal  range. PA peak pressure: 27 mm Hg (S).    Neuro/Psych    GI/Hepatic Neg liver ROS, GERD  ,  Endo/Other    Renal/GU      Musculoskeletal   Abdominal (+) + obese (BMI  34.98),   Peds  Hematology   Anesthesia Other Findings All: PCN Dilaudid Statins  Reproductive/Obstetrics                            Anesthesia Physical Anesthesia Plan  ASA: 4  Anesthesia Plan: MAC   Post-op Pain Management:    Induction:   PONV Risk Score and Plan: Treatment may vary due to age or medical condition  Airway Management Planned: Natural Airway and Nasal Cannula  Additional Equipment: None  Intra-op Plan:   Post-operative Plan:   Informed Consent: I have reviewed the patients History and Physical, chart, labs and discussed the procedure including the risks, benefits and alternatives for the proposed anesthesia with the patient or authorized representative who has indicated his/her understanding and acceptance.     Dental advisory given  Plan Discussed with:   Anesthesia Plan Comments: (+ cologard for colonoscopy)       Anesthesia Quick Evaluation

## 2021-05-30 ENCOUNTER — Encounter (HOSPITAL_COMMUNITY)
Admission: RE | Disposition: A | Discharge status: Home / Self Care | Payer: Self-pay | Source: Home / Self Care | Attending: Gastroenterology

## 2021-05-30 ENCOUNTER — Other Ambulatory Visit: Payer: Self-pay

## 2021-05-30 ENCOUNTER — Ambulatory Visit (HOSPITAL_COMMUNITY)
Admission: RE | Admit: 2021-05-30 | Discharge: 2021-05-30 | Disposition: A | Discharge status: Home / Self Care | Payer: Medicare HMO | Attending: Gastroenterology | Admitting: Gastroenterology

## 2021-05-30 ENCOUNTER — Encounter (HOSPITAL_COMMUNITY): Payer: Self-pay | Admitting: Gastroenterology

## 2021-05-30 ENCOUNTER — Ambulatory Visit (HOSPITAL_COMMUNITY): Payer: Medicare HMO | Admitting: Anesthesiology

## 2021-05-30 DIAGNOSIS — K641 Second degree hemorrhoids: Secondary | ICD-10-CM | POA: Diagnosis not present

## 2021-05-30 DIAGNOSIS — Z8371 Family history of colonic polyps: Secondary | ICD-10-CM | POA: Diagnosis not present

## 2021-05-30 DIAGNOSIS — K635 Polyp of colon: Secondary | ICD-10-CM

## 2021-05-30 DIAGNOSIS — Z885 Allergy status to narcotic agent status: Secondary | ICD-10-CM | POA: Insufficient documentation

## 2021-05-30 DIAGNOSIS — Z87891 Personal history of nicotine dependence: Secondary | ICD-10-CM | POA: Insufficient documentation

## 2021-05-30 DIAGNOSIS — Z888 Allergy status to other drugs, medicaments and biological substances status: Secondary | ICD-10-CM | POA: Diagnosis not present

## 2021-05-30 DIAGNOSIS — D122 Benign neoplasm of ascending colon: Secondary | ICD-10-CM

## 2021-05-30 DIAGNOSIS — Z880 Allergy status to penicillin: Secondary | ICD-10-CM | POA: Diagnosis not present

## 2021-05-30 DIAGNOSIS — Z9981 Dependence on supplemental oxygen: Secondary | ICD-10-CM | POA: Diagnosis not present

## 2021-05-30 DIAGNOSIS — D123 Benign neoplasm of transverse colon: Secondary | ICD-10-CM | POA: Diagnosis not present

## 2021-05-30 DIAGNOSIS — J449 Chronic obstructive pulmonary disease, unspecified: Secondary | ICD-10-CM | POA: Diagnosis not present

## 2021-05-30 DIAGNOSIS — D490 Neoplasm of unspecified behavior of digestive system: Secondary | ICD-10-CM | POA: Diagnosis not present

## 2021-05-30 DIAGNOSIS — R195 Other fecal abnormalities: Secondary | ICD-10-CM | POA: Diagnosis present

## 2021-05-30 DIAGNOSIS — D125 Benign neoplasm of sigmoid colon: Secondary | ICD-10-CM | POA: Insufficient documentation

## 2021-05-30 DIAGNOSIS — I480 Paroxysmal atrial fibrillation: Secondary | ICD-10-CM | POA: Insufficient documentation

## 2021-05-30 DIAGNOSIS — Z7901 Long term (current) use of anticoagulants: Secondary | ICD-10-CM | POA: Insufficient documentation

## 2021-05-30 DIAGNOSIS — K5730 Diverticulosis of large intestine without perforation or abscess without bleeding: Secondary | ICD-10-CM | POA: Diagnosis not present

## 2021-05-30 DIAGNOSIS — Z5181 Encounter for therapeutic drug level monitoring: Secondary | ICD-10-CM

## 2021-05-30 DIAGNOSIS — D124 Benign neoplasm of descending colon: Secondary | ICD-10-CM | POA: Diagnosis not present

## 2021-05-30 DIAGNOSIS — Q438 Other specified congenital malformations of intestine: Secondary | ICD-10-CM | POA: Insufficient documentation

## 2021-05-30 DIAGNOSIS — K6389 Other specified diseases of intestine: Secondary | ICD-10-CM

## 2021-05-30 HISTORY — PX: SUBMUCOSAL TATTOO INJECTION: SHX6856

## 2021-05-30 HISTORY — PX: HEMOSTASIS CLIP PLACEMENT: SHX6857

## 2021-05-30 HISTORY — PX: BIOPSY: SHX5522

## 2021-05-30 HISTORY — PX: POLYPECTOMY: SHX5525

## 2021-05-30 HISTORY — PX: COLONOSCOPY WITH PROPOFOL: SHX5780

## 2021-05-30 SURGERY — COLONOSCOPY WITH PROPOFOL
Anesthesia: Monitor Anesthesia Care

## 2021-05-30 MED ORDER — SODIUM CHLORIDE 0.9 % IV SOLN: INTRAVENOUS | Status: DC

## 2021-05-30 MED ORDER — PROPOFOL 10 MG/ML IV BOLUS
INTRAVENOUS | Status: DC | PRN
  Administered 2021-05-30: 30 mg via INTRAVENOUS

## 2021-05-30 MED ORDER — LIDOCAINE HCL (CARDIAC) PF 100 MG/5ML IV SOSY
PREFILLED_SYRINGE | INTRAVENOUS | Status: DC | PRN
  Administered 2021-05-30: 100 mg via INTRAVENOUS

## 2021-05-30 MED ORDER — LACTATED RINGERS IV SOLN: INTRAVENOUS | Status: DC

## 2021-05-30 MED ORDER — PROPOFOL 500 MG/50ML IV EMUL
INTRAVENOUS | Status: AC
  Filled 2021-05-30: qty 50

## 2021-05-30 MED ORDER — PROPOFOL 500 MG/50ML IV EMUL
INTRAVENOUS | Status: DC | PRN
  Administered 2021-05-30: 125 ug/kg/min via INTRAVENOUS

## 2021-05-30 MED ORDER — SPOT INK MARKER SYRINGE KIT
PACK | SUBMUCOSAL | Status: AC
  Filled 2021-05-30: qty 5

## 2021-05-30 MED ORDER — SPOT INK MARKER SYRINGE KIT
PACK | SUBMUCOSAL | Status: DC | PRN
  Administered 2021-05-30: 4.5 mL via SUBMUCOSAL

## 2021-05-30 SURGICAL SUPPLY — 21 items

## 2021-05-30 NOTE — Anesthesia Postprocedure Evaluation (Signed)
Anesthesia Post Note  Patient: [name]  Procedure(s) Performed: COLONOSCOPY WITH PROPOFOL POLYPECTOMY HEMOSTASIS CLIP PLACEMENT SUBMUCOSAL TATTOO INJECTION BIOPSY     Patient location during evaluation: Endoscopy Anesthesia Type: MAC Level of consciousness: awake and alert Pain management: pain level controlled Vital Signs Assessment: post-procedure vital signs reviewed and stable Respiratory status: spontaneous breathing, nonlabored ventilation, respiratory function stable and patient connected to nasal cannula oxygen Cardiovascular status: blood pressure returned to baseline and stable Postop Assessment: no apparent nausea or vomiting Anesthetic complications: no   No notable events documented.  Last Vitals:  Vitals:   05/30/21 0950 05/30/21 1002  BP: (!) 92/48 125/69  Pulse: 72 71  Resp: (!) 21 20  Temp:    SpO2: 100% 100%    Last Pain:  Vitals:   05/30/21 0950  TempSrc:   PainSc: 0-No pain                 Barnet Glasgow

## 2021-05-30 NOTE — H&P (Signed)
Chief Complaint: Positive Cologuard  HPI:     [name] is a 70 y.o. female with a past medical history of COPD on 2 L O2 continuously, GERD, A. fib on Eliquis (11/05/2018 echo with LVEF 55-60%), presenting to [facility] long Endoscopy Unit for colonoscopy for evaluation of positive Cologuard in 02/2021.  She was initially evaluated for this by Ellouise Newer, PA-C in the office on 04/11/2021 and referred to [facility] for colonoscopy due to elevated periprocedural risks from underlying comorbidities.  Prior colonoscopy at age 89 (4 years ago) notable for 2 polyps per patient, but was told to repeat in 10 years.  Otherwise without hematochezia, melena or other GI symptoms.  She is otherwise without any complaints today.  Past Medical History:  Diagnosis Date  . Colon polyps   . COPD (chronic obstructive pulmonary disease) ([place])   . GERD (gastroesophageal reflux disease)   . Hypercholesteremia   . Mild mitral regurgitation   . Mitral valve prolapse    a. dx 1985, not seen on echoes more recently.  . Obesity   . Paroxysmal atrial fibrillation ([facility])   . Paroxysmal atrial flutter ([place])    a. dx 05/2017.     Past Surgical History:  Procedure Laterality Date  . ABDOMINAL HYSTERECTOMY    . ATRIAL FIBRILLATION ABLATION N/A 05/04/2018   Procedure: ATRIAL FIBRILLATION ABLATION;  Surgeon: Thompson Grayer, MD;  Location: [facility];  Service: Cardiovascular;  Laterality: N/A;  . CARDIOVERSION N/A 10/08/2017   Procedure: CARDIOVERSION;  Surgeon: Herminio Commons, MD;  Location: [facility];  Service: Cardiovascular;  Laterality: N/A;  . CESAREAN SECTION    . TEE WITHOUT CARDIOVERSION N/A 05/03/2018   Procedure: TRANSESOPHAGEAL ECHOCARDIOGRAM (TEE);  Surgeon: Fay Records, MD;  Location: [facility]COPY;  Service: Cardiovascular;  Laterality: N/A;  . TUBAL LIGATION     Family History  Problem Relation Age of Onset  . Heart attack Mother 42  . Asthma Mother   . Emphysema Father         smoked  . Heart disease Father   . Atrial fibrillation Brother   . Colon polyps Brother   . Cancer Maternal Grandfather        type unknown  . Hypertension Son   . Atrial fibrillation Son    Social History   Tobacco Use  . Smoking status: Former    Packs/day: 1.00    Years: 20.00    Pack years: 20.00    Types: Cigarettes    Quit date: 08/14/1990    Years since quitting: 30.8  . Smokeless tobacco: Never  Vaping Use  . Vaping Use: Never used  Substance Use Topics  . Alcohol use: No    Alcohol/week: 0.0 standard drinks  . Drug use: No   Current Facility-Administered Medications  Medication Dose Route Frequency Provider Last Rate Last Admin  . 0.9 %  sodium chloride infusion   Intravenous Continuous Levin Erp, [place]      . lactated ringers infusion   Intravenous Continuous Lakaya Tolen V, DO 10 mL/hr at 05/30/21 0737 Continued from Pre-op at 05/30/21 0737   Allergies  Allergen Reactions  . Penicillins Rash    Has patient had a PCN reaction causing immediate rash, facial/tongue/throat swelling, SOB or lightheadedness with hypotension: Yes Has patient had a PCN reaction causing severe rash involving mucus membranes or skin necrosis: Yes Has patient had a PCN reaction that required hospitalization: No Has patient had a  PCN reaction occurring within the last 10 years: No If all of the above answers are "NO", then may proceed with Cephalosporin use.   . Statins     myopathy  . Dilaudid [Hydromorphone Hcl] Rash     Review of Systems: All systems reviewed and negative except where noted in HPI.     Physical Exam:    Wt Readings from Last 3 Encounters:  05/30/21 93.9 kg  04/11/21 95.8 kg  01/17/21 97.6 kg    BP 130/78   Pulse 100   Temp 98.5 F (36.9 C) (Oral)   Resp (!) 25   Ht 5' 4.5" (1.638 m)   Wt 93.9 kg   SpO2 97%   BMI 34.98 kg/m  Constitutional:  Pleasant, in no acute distress. Psychiatric: Normal mood and affect. Behavior is  normal. EENT: Pupils normal.  Conjunctivae are normal. No scleral icterus. Neck supple. No cervical LAD. Cardiovascular: Normal rate, regular rhythm. No edema Pulmonary/chest: Effort normal and breath sounds normal. No wheezing, rales or rhonchi. Abdominal: Soft, nondistended, nontender. Bowel sounds active throughout. There are no masses palpable. No hepatomegaly. Neurological: Alert and oriented to person place and time. Skin: Skin is warm and dry. No rashes noted.   ASSESSMENT AND PLAN;   1) positive Cologuard 2) COPD on home O2 3) atrial fibrillation 4) Chronic anticoagulation  - Colonoscopy today - Procedure scheduled at [facility] due to elevated periprocedural risks - Has been holding Eliquis for the last 2 days.  Will make recommendation regarding when to restart Eliquis pending colonoscopy findings   Lavena Bullion, DO, FACG  05/30/2021, 8:19 AM   No ref. provider found

## 2021-05-30 NOTE — Anesthesia Procedure Notes (Signed)
Procedure Name: MAC Date/Time: 05/30/2021 8:37 AM Performed by: Lieutenant Diego, CRNA Pre-anesthesia Checklist: Patient identified, Emergency Drugs available, Suction available, Patient being monitored and Timeout performed Patient Re-evaluated:Patient Re-evaluated prior to induction Oxygen Delivery Method: Simple face mask Preoxygenation: Pre-oxygenation with 100% oxygen Induction Type: IV induction

## 2021-05-30 NOTE — Interval H&P Note (Signed)
History and Physical Interval Note:  05/30/2021 8:23 AM  [name]  has presented today for surgery, with the diagnosis of positive cologuard, O2 use, chronic anticoagulation.  The various methods of treatment have been discussed with the patient and family. After consideration of risks, benefits and other options for treatment, the patient has consented to  Procedure(s): COLONOSCOPY WITH PROPOFOL (N/A) as a surgical intervention.  The patient's history has been reviewed, patient examined, no change in status, stable for surgery.  I have reviewed the patient's chart and labs.  Questions were answered to the patient's satisfaction.     Dominic Pea Deangela Randleman

## 2021-05-30 NOTE — Op Note (Signed)
[facility] Patient Name: [name] Procedure Date: 05/30/2021 MRN: [id number] Attending MD: Gerrit Heck , MD Date of Birth: February 21, 1951 CSN: [id number] Age: 70 Admit Type: Outpatient Procedure:                Colonoscopy Indications:              Positive Cologuard test                           Last colonsocopy was 12 years ago and notable for 2                            polyps per patient. She is otherwise without active                            GI symptoms. Providers:                Gerrit Heck, MD, Terrall Laity, Kary Kos                            RN, RN, Tyna Jaksch Technician Referring MD:              Medicines:                Monitored Anesthesia Care Complications:            No immediate complications. Estimated Blood Loss:     Estimated blood loss was minimal. Procedure:                Pre-Anesthesia Assessment:                           - Prior to the procedure, a History and Physical                            was performed, and patient medications and                            allergies were reviewed. The patient's tolerance of                            previous anesthesia was also reviewed. The risks                            and benefits of the procedure and the sedation                            options and risks were discussed with the patient.                            All questions were answered, and informed consent                            was obtained. Prior Anticoagulants: The patient has                            taken  Eliquis (apixaban), last dose was 2 days                            prior to procedure. ASA Grade Assessment: IV - A                            patient with severe systemic disease that is a                            constant threat to life. After reviewing the risks                            and benefits, the patient was deemed in                            satisfactory condition to undergo the  procedure.                           After obtaining informed consent, the colonoscope                            was passed under direct vision. Throughout the                            procedure, the patient's blood pressure, pulse, and                            oxygen saturations were monitored continuously. The                            [id number]) Olympus colonoscope was                            introduced through the anus and advanced to the the                            terminal ileum. The colonoscopy was technically                            difficult and complex due to a tortuous colon.                            Successful completion of the procedure was aided by                            using manual pressure. The patient tolerated the                            procedure well. The quality of the bowel                            preparation was good. The terminal ileum, ileocecal  valve, appendiceal orifice, and rectum were                            photographed. Scope In: 8:35:41 AM Scope Out: 9:20:33 AM Scope Withdrawal Time: 0 hours 32 minutes 35 seconds  Total Procedure Duration: 0 hours 44 minutes 52 seconds  Findings:      Hemorrhoids were found on perianal exam.      Three sessile polyps were found in the ascending colon. The polyps were       4 to 6 mm in size. These polyps were removed with a cold snare.       Resection and retrieval were complete. To prevent bleeding after the       polypectomy, two hemostatic clips were successfully placed (MR       conditional). There was no bleeding at the end of the procedure.      A frond-like/villous non-obstructing polypoid mass was found in the       proximal transverse colon. The mass was non-circumferential. There was       mild contact oozing and central tethering. This was biopsied with a cold       forceps for histology. Area 3 cm distal to the lesion was then tattooed        with an injection of 3 mL of Spot (carbon black) placed on the       mesenteric and anti-mesenteric sides. Estimated blood loss was minimal.      Four sessile polyps were found in the sigmoid colon (2), descending       colon, and transverse colon. The polyps were 3 to 6 mm in size. These       polyps were removed with a cold snare. Resection and retrieval were       complete. Estimated blood loss was minimal.      Many small and large-mouthed diverticula were found in the sigmoid colon       and descending colon.      The sigmoid colon was significantly tortuous. Advancing the scope       required using manual pressure.      Non-bleeding internal hemorrhoids were found during retroflexion. The       hemorrhoids were small and Grade II (internal hemorrhoids that prolapse       but reduce spontaneously).      The terminal ileum appeared normal. Impression:               - Hemorrhoids found on perianal exam.                           - Three 4 to 6 mm polyps in the ascending colon,                            removed with a cold snare. Resected and retrieved.                            Clips (MR conditional) were placed.                           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 Biopsied.  Tattooed.                           - Four 3 to 6 mm polyps in the sigmoid colon, in                            the descending colon and in the transverse colon,                            removed with a cold snare. Resected and retrieved.                           - Diverticulosis in the sigmoid colon and in the                            descending colon.                           - Tortuous colon.                           - Non-bleeding internal hemorrhoids.                           - The examined portion of the ileum was normal. Moderate Sedation:      Not Applicable - Patient had care per Anesthesia. Recommendation:           - Patient has a contact number  available for                            emergencies. The signs and symptoms of potential                            delayed complications were discussed with the                            patient. Return to normal activities tomorrow.                            Written discharge instructions were provided to the                            patient.                           - Resume previous diet.                           - Continue present medications.                           - Await pathology results.                           - Refer to a colo-rectal surgeon at appointment to  be scheduled.                           - Perform a CT scan (computed tomography) of chest,                            abdomen, and pelvis with contrast at appointment to                            be scheduled.                           - Check CEA.                           - Schedule follow-up appointment in the GI Clinic                            in 2-3 weeks. Procedure Code(s):        --- Professional ---                           [phone], Colonoscopy, flexible; with removal of                            tumor(s), polyp(s), or other lesion(s) by snare                            technique                           45381, Colonoscopy, flexible; with directed                            submucosal injection(s), any substance                           [id number], 59, Colonoscopy, flexible; with biopsy,                            single or multiple Diagnosis Code(s):        --- Professional ---                           K64.1, Second degree hemorrhoids                           K63.5, Polyp of colon                           D49.0, Neoplasm of unspecified behavior of                            digestive system                           R19.5, Other fecal abnormalities  K57.30, Diverticulosis of large intestine without                            perforation or  abscess without bleeding                           Q43.8, Other specified congenital malformations of                            intestine CPT copyright 2019 American Medical Association. All rights reserved. The codes documented in this report are preliminary and upon coder review may  be revised to meet current compliance requirements. Gerrit Heck, MD 05/30/2021 9:43:04 AM Number of Addenda: 0

## 2021-05-30 NOTE — Discharge Instructions (Addendum)
Resume eliquis in 2 days.  YOU HAD AN ENDOSCOPIC PROCEDURE TODAY: Refer to the procedure report and other information in the discharge instructions given to you for any specific questions about what was found during the examination. If this information does not answer your questions, please call [place] office at [phone] to clarify.   YOU SHOULD EXPECT: Some feelings of bloating in the abdomen. Passage of more gas than usual. Walking can help get rid of the air that was put into your GI tract during the procedure and reduce the bloating. If you had a lower endoscopy (such as a colonoscopy or flexible sigmoidoscopy) you may notice spotting of blood in your stool or on the toilet paper. Some abdominal soreness may be present for a day or two, also.  DIET: Your first meal following the procedure should be a light meal and then it is ok to progress to your normal diet. A half-sandwich or bowl of soup is an example of a good first meal. Heavy or fried foods are harder to digest and may make you feel nauseous or bloated. Drink plenty of fluids but you should avoid alcoholic beverages for 24 hours. If you had a esophageal dilation, please see attached instructions for diet.    ACTIVITY: Your care partner should take you home directly after the procedure. You should plan to take it easy, moving slowly for the rest of the day. You can resume normal activity the day after the procedure however YOU SHOULD NOT DRIVE, use power tools, machinery or perform tasks that involve climbing or major physical exertion for 24 hours (because of the sedation medicines used during the test).   SYMPTOMS TO REPORT IMMEDIATELY: A gastroenterologist can be reached at any hour. Please call [phone]  for any of the following symptoms:  Following lower endoscopy (colonoscopy, flexible sigmoidoscopy) Excessive amounts of blood in the stool  Significant tenderness, worsening of abdominal pains  Swelling of the abdomen that is  new, acute  Fever of 100 or higher   FOLLOW UP:  If any biopsies were taken you will be contacted by phone or by letter within the next 1-3 weeks. Call [phone]  if you have not heard about the biopsies in 3 weeks.  Please also call with any specific questions about appointments or follow up tests.

## 2021-05-30 NOTE — Transfer of Care (Addendum)
Immediate Anesthesia Transfer of Care Note  Patient: [name]  Procedure(s) Performed: COLONOSCOPY WITH PROPOFOL POLYPECTOMY HEMOSTASIS CLIP PLACEMENT SUBMUCOSAL TATTOO INJECTION BIOPSY  Patient Location: Endoscopy Unit  Anesthesia Type:MAC  Level of Consciousness: drowsy  Airway & Oxygen Therapy: Patient Spontanous Breathing and Patient connected to face mask oxygen  Post-op Assessment: Report given to RN and Post -op Vital signs reviewed and stable  Post vital signs: Reviewed and stable  Last Vitals:  Vitals Value Taken Time  BP    Temp    Pulse    Resp    SpO2      Last Pain:  Vitals:   05/30/21 0728  TempSrc: Oral  PainSc: 0-No pain         Complications: No notable events documented.

## 2021-05-31 ENCOUNTER — Encounter (HOSPITAL_COMMUNITY): Payer: Self-pay | Admitting: Gastroenterology

## 2021-05-31 LAB — SURGICAL PATHOLOGY

## 2021-06-04 ENCOUNTER — Encounter: Payer: Self-pay | Admitting: Gastroenterology

## 2021-06-04 ENCOUNTER — Telehealth: Payer: Self-pay

## 2021-06-04 DIAGNOSIS — Z9981 Dependence on supplemental oxygen: Secondary | ICD-10-CM

## 2021-06-04 DIAGNOSIS — R195 Other fecal abnormalities: Secondary | ICD-10-CM

## 2021-06-04 DIAGNOSIS — D123 Benign neoplasm of transverse colon: Secondary | ICD-10-CM

## 2021-06-04 DIAGNOSIS — D122 Benign neoplasm of ascending colon: Secondary | ICD-10-CM

## 2021-06-04 NOTE — Telephone Encounter (Addendum)
LVM for patient to call back.   She will need to see about gettting lab work done from [facility] her CMP and CEA before her CT and her CT and seeing about her referrral. And to make an appointment with Dr Loletha Grayer

## 2021-06-04 NOTE — Telephone Encounter (Signed)
-----   Message from [facility], DO sent at 06/04/2021 12:43 PM EDT ----- The polyps resected during your recent colonoscopy were all tubular adenomas.  The polypoid mass in the transverse colon that was biopsied also shows tubular adenoma.  While there was no evidence of malignancy or high-grade dysplasia, this lesion was only biopsied and still needs to be resected.  Based on the endoscopic appearance, I think surgical resection is favored over attempt at endoscopic resection.  At the time of colonoscopy, I had recommended for CT chest/abdomen/pelvis along with CEA and referral to Colorectal Surgery.  Please follow-up with this patient to ensure the studies and referrals are in progress.  Can schedule follow-up appoint with me in the next 2-3 weeks.

## 2021-06-09 DIAGNOSIS — J449 Chronic obstructive pulmonary disease, unspecified: Secondary | ICD-10-CM | POA: Diagnosis not present

## 2021-06-10 NOTE — Telephone Encounter (Signed)
Called patient to let her know that insurance has approved her CT yet. Was told it they will review it in 2 days so just wanted to keep her informed. Patient said she cant do the 16 or 19 29 or 31 when it comes to appointments.

## 2021-06-11 ENCOUNTER — Other Ambulatory Visit: Payer: Self-pay | Admitting: Family Medicine

## 2021-06-11 DIAGNOSIS — N3281 Overactive bladder: Secondary | ICD-10-CM

## 2021-06-12 NOTE — Telephone Encounter (Signed)
Patient CT is 8-18 and she is coming to 8-11 to get the lab work and CT instructions. CCS is scheduled for September 13th

## 2021-06-13 ENCOUNTER — Other Ambulatory Visit (INDEPENDENT_AMBULATORY_CARE_PROVIDER_SITE_OTHER): Payer: Medicare HMO

## 2021-06-13 ENCOUNTER — Ambulatory Visit (INDEPENDENT_AMBULATORY_CARE_PROVIDER_SITE_OTHER): Payer: Medicare HMO

## 2021-06-13 ENCOUNTER — Other Ambulatory Visit: Payer: Self-pay

## 2021-06-13 DIAGNOSIS — Z9981 Dependence on supplemental oxygen: Secondary | ICD-10-CM

## 2021-06-13 DIAGNOSIS — D122 Benign neoplasm of ascending colon: Secondary | ICD-10-CM | POA: Diagnosis not present

## 2021-06-13 DIAGNOSIS — D123 Benign neoplasm of transverse colon: Secondary | ICD-10-CM

## 2021-06-13 DIAGNOSIS — E538 Deficiency of other specified B group vitamins: Secondary | ICD-10-CM | POA: Diagnosis not present

## 2021-06-13 DIAGNOSIS — R195 Other fecal abnormalities: Secondary | ICD-10-CM

## 2021-06-13 DIAGNOSIS — K635 Polyp of colon: Secondary | ICD-10-CM | POA: Diagnosis not present

## 2021-06-13 NOTE — Progress Notes (Signed)
Cyanocobalamin injection given to right deltoid.  Patient tolerated well. 

## 2021-06-14 LAB — CEA: CEA: <0.5 ng/mL

## 2021-06-14 LAB — COMPREHENSIVE METABOLIC PANEL
ALT: 13 IU/L (ref 0–32)
AST: 23 IU/L (ref 0–40)
Albumin/Globulin Ratio: 1.6 (ref 1.2–2.2)
Albumin: 4 g/dL (ref 3.7–4.7)
Alkaline Phosphatase: 84 IU/L (ref 44–121)
BUN/Creatinine Ratio: 19 (ref 12–28)
BUN: 16 mg/dL (ref 8–27)
Bilirubin Total: 0.5 mg/dL (ref 0.0–1.2)
CO2: 27 mmol/L (ref 20–29)
Calcium: 9.3 mg/dL (ref 8.7–10.3)
Chloride: 101 mmol/L (ref 96–106)
Creatinine, Ser: 0.84 mg/dL (ref 0.57–1.00)
Globulin, Total: 2.5 g/dL (ref 1.5–4.5)
Glucose: 105 mg/dL — ABNORMAL HIGH (ref 65–99)
Potassium: 5 mmol/L (ref 3.5–5.2)
Sodium: 142 mmol/L (ref 134–144)
Total Protein: 6.5 g/dL (ref 6.0–8.5)
eGFR: 74 mL/min/{1.73_m2} (ref >59)

## 2021-06-20 ENCOUNTER — Other Ambulatory Visit: Payer: Self-pay

## 2021-06-20 ENCOUNTER — Ambulatory Visit (HOSPITAL_COMMUNITY)
Admission: RE | Admit: 2021-06-20 | Discharge: 2021-06-20 | Disposition: A | Discharge status: Home / Self Care | Payer: Medicare HMO | Source: Ambulatory Visit | Attending: Gastroenterology | Admitting: Gastroenterology

## 2021-06-20 DIAGNOSIS — R195 Other fecal abnormalities: Secondary | ICD-10-CM | POA: Insufficient documentation

## 2021-06-20 DIAGNOSIS — D122 Benign neoplasm of ascending colon: Secondary | ICD-10-CM | POA: Insufficient documentation

## 2021-06-20 DIAGNOSIS — Z9981 Dependence on supplemental oxygen: Secondary | ICD-10-CM | POA: Diagnosis not present

## 2021-06-20 DIAGNOSIS — D123 Benign neoplasm of transverse colon: Secondary | ICD-10-CM | POA: Diagnosis not present

## 2021-06-20 DIAGNOSIS — I2510 Atherosclerotic heart disease of native coronary artery without angina pectoris: Secondary | ICD-10-CM | POA: Diagnosis not present

## 2021-06-20 DIAGNOSIS — I700 Atherosclerosis of aorta: Secondary | ICD-10-CM | POA: Diagnosis not present

## 2021-06-20 DIAGNOSIS — K429 Umbilical hernia without obstruction or gangrene: Secondary | ICD-10-CM | POA: Diagnosis not present

## 2021-06-20 DIAGNOSIS — Z86010 Personal history of colonic polyps: Secondary | ICD-10-CM | POA: Diagnosis not present

## 2021-06-20 DIAGNOSIS — K5730 Diverticulosis of large intestine without perforation or abscess without bleeding: Secondary | ICD-10-CM | POA: Diagnosis not present

## 2021-06-20 DIAGNOSIS — N281 Cyst of kidney, acquired: Secondary | ICD-10-CM | POA: Diagnosis not present

## 2021-06-20 DIAGNOSIS — J984 Other disorders of lung: Secondary | ICD-10-CM | POA: Diagnosis not present

## 2021-06-20 MED ORDER — IOHEXOL 350 MG/ML SOLN
100.0000 mL | Freq: Once | INTRAVENOUS | Status: AC | PRN
  Administered 2021-06-20: 100 mL via INTRAVENOUS

## 2021-06-21 ENCOUNTER — Ambulatory Visit (INDEPENDENT_AMBULATORY_CARE_PROVIDER_SITE_OTHER): Payer: Medicare HMO

## 2021-06-21 VITALS — Ht 65.0 in | Wt 207.0 lb

## 2021-06-21 DIAGNOSIS — Z0000 Encounter for general adult medical examination without abnormal findings: Secondary | ICD-10-CM | POA: Diagnosis not present

## 2021-06-21 NOTE — Progress Notes (Addendum)
Subjective:   [name] is a 70 y.o. female who presents for Medicare Annual (Subsequent) preventive examination.  Virtual Visit via Telephone Note  I connected with  [name] on 06/21/21 at  1:15 PM EDT by telephone and verified that I am speaking with the correct person using two identifiers.  Location: Patient: Home Provider: [facility] Persons participating in the virtual visit: patient/Nurse Health Advisor   I discussed the limitations, risks, security and privacy concerns of performing an evaluation and management service by telephone and the availability of in person appointments. The patient expressed understanding and agreed to proceed.  Interactive audio and video telecommunications were attempted between this nurse and patient, however failed, due to patient having technical difficulties OR patient did not have access to video capability.  We continued and completed visit with audio only.  Some vital signs may be absent or patient reported.   Lyndell Allaire E Tane Biegler, LPN   Review of Systems     Cardiac Risk Factors include: advanced age (>43mn, >>49women);sedentary lifestyle;obesity (BMI >30kg/m2);dyslipidemia;Other (see comment), Risk factor comments: A.fib, COPD, Oxygen dependent     Objective:    Today's Vitals   06/21/21 1323  Weight: 207 lb (93.9 kg)  Height: '5\' 5"'$  (1.651 m)   Body mass index is 34.45 kg/m.  Advanced Directives 06/21/2021 05/30/2021 06/19/2020 06/17/2019 06/17/2019 09/28/2018 06/14/2018  Does Patient Have a Medical Advance Directive? No No No No No No No  Would patient like information on creating a medical advance directive? No - Patient declined No - Patient declined No - Patient declined Yes (MAU/Ambulatory/Procedural Areas - Information given) - - No - Patient declined  Pre-existing out of facility DNR order (yellow form or pink MOST form) - - - - - - -    Current Medications (verified) Outpatient Encounter Medications as of 06/21/2021   Medication Sig   albuterol (PROAIR HFA) 108 (90 Base) MCG/ACT inhaler Inhale 2 puffs into the lungs every 6 (six) hours as needed for wheezing or shortness of breath.   albuterol (PROVENTIL) (2.5 MG/3ML) 0.083% nebulizer solution Take 3 mLs (2.5 mg total) by nebulization every 6 (six) hours as needed for wheezing or shortness of breath.   Bempedoic Acid-Ezetimibe (NEXLIZET) 180-10 MG TABS Take 1 tablet by mouth daily.   Cholecalciferol (VITAMIN D3) 5000 UNITS CAPS Take 5,000 Units by mouth daily.    ELIQUIS 5 MG TABS tablet TAKE (1) TABLET TWICE A DAY. (Patient taking differently: Take 5 mg by mouth 2 (two) times daily.)   guaiFENesin (MUCINEX) 600 MG 12 hr tablet Take 600 mg by mouth every morning.    metoprolol tartrate (LOPRESSOR) 25 MG tablet Take 1 tablet (25 mg total) by mouth 2 (two) times daily.   mirabegron ER (MYRBETRIQ) 25 MG TB24 tablet Take 1 tablet (25 mg total) by mouth daily. (NEEDS TO BE SEEN BEFORE NEXT REFILL)   montelukast (SINGULAIR) 10 MG tablet TAKE ONE TABLET AT BEDTIME (Patient taking differently: Take 10 mg by mouth at bedtime. TAKE ONE TABLET AT BEDTIME)   OXYGEN Inhale 2 L into the lungs continuous.   pantoprazole (PROTONIX) 40 MG tablet Take 1 tablet (40 mg total) by mouth daily.   TRELEGY ELLIPTA 100-62.5-25 MCG/INH AEPB INHALE ONE PUFF EVERY DAY (Patient taking differently: Inhale 1 puff into the lungs daily.)   VASCEPA 1 g capsule TAKE (2) CAPSULES TWICE DAILY. (Patient taking differently: Take 2 g by mouth 2 (two) times daily. TAKE (2) CAPSULES TWICE DAILY.)   Cyanocobalamin (  VITAMIN B-12 IJ) Inject 1,000 mcg into the muscle every 30 (thirty) days. (Patient not taking: Reported on 06/21/2021)   doxycycline (VIBRAMYCIN) 100 MG capsule Take 100 mg by mouth 2 (two) times daily. Stye on her eye (Patient not taking: Reported on 06/21/2021)   Facility-Administered Encounter Medications as of 06/21/2021  Medication   cyanocobalamin ((VITAMIN B-12)) injection 1,000 mcg     Allergies (verified) Penicillins, Statins, and Dilaudid [hydromorphone hcl]   History: Past Medical History:  Diagnosis Date   Colon polyps    COPD (chronic obstructive pulmonary disease) (HCC)    GERD (gastroesophageal reflux disease)    Hypercholesteremia    Mild mitral regurgitation    Mitral valve prolapse    a. dx 1985, not seen on echoes more recently.   Obesity    Paroxysmal atrial fibrillation (HCC)    Paroxysmal atrial flutter ([place])    a. dx 05/2017.   Past Surgical History:  Procedure Laterality Date   ABDOMINAL HYSTERECTOMY     ATRIAL FIBRILLATION ABLATION N/A 05/04/2018   Procedure: ATRIAL FIBRILLATION ABLATION;  Surgeon: Thompson Grayer, MD;  Location: [facility];  Service: Cardiovascular;  Laterality: N/A;   BIOPSY  05/30/2021   Procedure: BIOPSY;  Surgeon: Lavena Bullion, DO;  Location: [facility];  Service: Gastroenterology;;   CARDIOVERSION N/A 10/08/2017   Procedure: CARDIOVERSION;  Surgeon: Herminio Commons, MD;  [facility];  Service: Cardiovascular;  Laterality: N/A;   CESAREAN SECTION     COLONOSCOPY WITH PROPOFOL N/A 05/30/2021   Procedure: COLONOSCOPY WITH PROPOFOL;  Surgeon: Lavena Bullion, DO;  Location: [facility];  Service: Gastroenterology;  Laterality: N/A;   HEMOSTASIS CLIP PLACEMENT  05/30/2021   Procedure: HEMOSTASIS CLIP PLACEMENT;  Surgeon: Lavena Bullion, DO;  Location: [facility];  Service: Gastroenterology;;   POLYPECTOMY  05/30/2021   Procedure: POLYPECTOMY;  Surgeon: Lavena Bullion, DO;  Location: [facility];  Service: Gastroenterology;;   SUBMUCOSAL TATTOO INJECTION  05/30/2021   Procedure: SUBMUCOSAL TATTOO INJECTION;  Surgeon: Lavena Bullion, DO;  Location: [facility];  Service: Gastroenterology;;   TEE WITHOUT CARDIOVERSION N/A 05/03/2018   Procedure: TRANSESOPHAGEAL ECHOCARDIOGRAM (TEE);  Surgeon: Fay Records, MD;  Location: [facility];  Service: Cardiovascular;  Laterality: N/A;    TUBAL LIGATION     Family History  Problem Relation Age of Onset   Heart attack Mother 53   Asthma Mother    Emphysema Father        smoked   Heart disease Father    Atrial fibrillation Brother    Colon polyps Brother    Cancer Maternal Grandfather        type unknown   Hypertension Son    Atrial fibrillation Son    Social History   Socioeconomic History   Marital status: Married    Spouse name: Alvis   Number of children: 1   Years of education: 12   Highest education level: 12th grade  Occupational History   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   Occupation: [employer]     Comment: retired  Tobacco Use   Smoking status: Former    Packs/day: 1.00    Years: 20.00    Pack years: 20.00    Types: Cigarettes    Quit date: 08/14/1990    Years since quitting: 30.8   Smokeless tobacco: Never  Vaping Use   Vaping Use: Never used  Substance and Sexual Activity   Alcohol  use: No    Alcohol/week: 0.0 standard drinks   Drug use: No   Sexual activity: Not Currently  Other Topics Concern   Not on file  Social History Narrative   Lives home with her husband   Oxygen dependent   Involved in church - socially active   Not very physically active, but doesn't have to use assistive device to walk   Social Determinants of Health   Financial Resource Strain: Low Risk    Difficulty of Paying Living Expenses: Not hard at all  Food Insecurity: No Food Insecurity   Worried About Charity fundraiser in the Last Year: Never true   Arboriculturist in the Last Year: Never true  Transportation Needs: No Transportation Needs   Lack of Transportation (Medical): No   Lack of Transportation (Non-Medical): No  Physical Activity: Inactive   Days of Exercise per Week: 0 days   Minutes of Exercise per Session: 0 min  Stress: No Stress Concern Present   Feeling of Stress : Not at all  Social Connections: Socially Integrated   Frequency of Communication with  Friends and Family: More than three times a week   Frequency of Social Gatherings with Friends and Family: More than three times a week   Attends Religious Services: More than 4 times per year   Active Member of [employer] or Organizations: Yes   Attends Music therapist: More than 4 times per year   Marital Status: Married    Tobacco Counseling Counseling given: Not Answered   Clinical Intake:  Pre-visit preparation completed: Yes  Pain : No/denies pain     BMI - recorded: 34.45 Nutritional Status: BMI > 30  Obese Nutritional Risks: None Diabetes: No  How often do you need to have someone help you when you read instructions, pamphlets, or other written materials from your doctor or pharmacy?: 1 - Never  Diabetic? No  Interpreter Needed?: No  Information entered by :: Aragorn Recker, LPN   Activities of Daily Living In your present state of health, do you have any difficulty performing the following activities: 06/21/2021  Hearing? N  Vision? N  Difficulty concentrating or making decisions? N  Walking or climbing stairs? N  Dressing or bathing? N  Doing errands, shopping? N  Preparing Food and eating ? N  Using the Toilet? N  In the past six months, have you accidently leaked urine? Y  Comment takes Myrbetriq  Do you have problems with loss of bowel control? N  Managing your Medications? N  Managing your Finances? N  Housekeeping or managing your Housekeeping? N  Some recent data might be hidden    Patient Care Team: Loman Brooklyn, FNP as PCP - General (Family Medicine) Thompson Grayer, MD as Consulting Physician (Cardiology) Lavera Guise, [facility] as Pharmacist (Family Medicine)  Indicate any recent Medical Services you may have received from other than [facility] providers in the past year (date may be approximate).     Assessment:   This is a routine wellness examination for Ezekiel.  Hearing/Vision screen Hearing Screening - Comments:: Denies hearing  difficulties  Vision Screening - Comments:: Wears eyeglasses - behind on annual eye exam with [facility]  Dietary issues and exercise activities discussed: Current Exercise Habits: The patient does not participate in regular exercise at present, Exercise limited by: respiratory conditions(s);cardiac condition(s)   Goals Addressed             This Visit's Progress  DIET - EAT MORE FRUITS AND VEGETABLES   On track    Eat a better diet with less fried foods and more fresh vegetablesl     Exercise 150 min/wk Moderate Activity   Not on track      Depression Screen PHQ 2/9 Scores 06/21/2021 05/03/2021 10/29/2020 09/25/2020 06/19/2020 05/04/2020 02/16/2020  PHQ - 2 Score 0 0 0 0 0 0 0  PHQ- 9 Score 1 3 - - - 3 -    Fall Risk Fall Risk  06/21/2021 01/17/2021 09/25/2020 06/19/2020 02/16/2020  Falls in the past year? 0 0 0 0 0  Number falls in past yr: 0 0 - - -  Injury with Fall? 0 0 - - -  Risk for fall due to : History of fall(s);Impaired vision - - - -  Follow up Falls prevention discussed;Education provided - - - -    FALL RISK PREVENTION PERTAINING TO THE HOME:  Any stairs in or around the home? No  If so, are there any without handrails? No  Home free of loose throw rugs in walkways, pet beds, electrical cords, etc? Yes  Adequate lighting in your home to reduce risk of falls? Yes   ASSISTIVE DEVICES UTILIZED TO PREVENT FALLS:  Life alert? No  Use of a cane, walker or w/c? No  Grab bars in the bathroom? No  Shower chair or bench in shower? Yes  Elevated toilet seat or a handicapped toilet? No   TIMED UP AND GO:  Was the test performed? No . Telephonic visit.  Cognitive Function: Normal cognitive status assessed by direct observation by this Nurse Health Advisor. No abnormalities found.    MMSE - Mini Mental State Exam 06/14/2018 05/27/2017  Orientation to time 5 5  Orientation to Place 5 5  Registration 3 3  Attention/ Calculation 5 5  Recall 3 3  Language- name 2  objects 2 2  Language- repeat 1 1  Language- follow 3 step command 3 3  Language- read & follow direction 1 1  Write a sentence 1 1  Copy design 1 1  Total score 30 30     6CIT Screen 06/19/2020 06/17/2019  What Year? 0 points 0 points  What month? 0 points 0 points  What time? 0 points 0 points  Count back from 20 0 points 0 points  Months in reverse 0 points 0 points  Repeat phrase 0 points 0 points  Total Score 0 0    Immunizations Immunization History  Administered Date(s) Administered   Influenza Split 08/03/2013   Influenza,inj,Quad PF,6+ Mos 08/26/2016, 08/12/2017   Influenza,inj,quad, With Preservative 08/17/2019   Influenza-Unspecified 08/12/2017, 08/11/2018, 08/31/2020   PFIZER(Purple Top)SARS-COV-2 Vaccination 03/12/2020, 03/29/2020   Pneumococcal Conjugate-13 11/04/2007    TDAP status: Due, Education has been provided regarding the importance of this vaccine. Advised may receive this vaccine at local pharmacy or Health Dept. Aware to provide a copy of the vaccination record if obtained from local pharmacy or Health Dept. Verbalized acceptance and understanding.  Flu Vaccine status: Up to date  Pneumococcal vaccine status: Up to date  Covid-19 vaccine status: Information provided on how to obtain vaccines.   Qualifies for Shingles Vaccine? Yes   Zostavax completed No   Shingrix Completed?: No.    Education has been provided regarding the importance of this vaccine. Patient has been advised to call insurance company to determine out of pocket expense if they have not yet received this vaccine. Advised may also receive vaccine at local  pharmacy or Health Dept. Verbalized acceptance and understanding.  Screening Tests Health Maintenance  Topic Date Due   Hepatitis C Screening  Never done   TETANUS/TDAP  Never done   Zoster Vaccines- Shingrix (1 of 2) Never done   PNA vac Low Risk Adult (2 of 2 - PPSV23) 12/10/2013   COVID-19 Vaccine (3 - Pfizer risk series)  04/26/2020   INFLUENZA VACCINE  06/03/2021   MAMMOGRAM  11/29/2021   Fecal DNA (Cologuard)  02/27/2024   DEXA SCAN  Completed   HPV VACCINES  Aged Out    Health Maintenance  Health Maintenance Due  Topic Date Due   Hepatitis C Screening  Never done   TETANUS/TDAP  Never done   Zoster Vaccines- Shingrix (1 of 2) Never done   PNA vac Low Risk Adult (2 of 2 - PPSV23) 12/10/2013   COVID-19 Vaccine (3 - Pfizer risk series) 04/26/2020   INFLUENZA VACCINE  06/03/2021    Colorectal cancer screening: Type of screening: Colonoscopy. Completed 05/30/2021. Repeat every ?3-5 years  Mammogram status: Completed 11/30/2019. Repeat every year She may have upcoming surgery - wants to postpone for now  Bone Density status: Completed 06/15/2018. Results reflect: Bone density results: NORMAL. Repeat every 5 years.  Lung Cancer Screening: (Low Dose CT Chest recommended if Age 45-80 years, 30 pack-year currently smoking OR have quit w/in 15years.) does not qualify.   Additional Screening:  Hepatitis C Screening: does qualify; DUE  Vision Screening: Recommended annual ophthalmology exams for early detection of glaucoma and other disorders of the eye. Is the patient up to date with their annual eye exam?  No  Who is the provider or what is the name of the office in which the patient attends annual eye exams? [place] If pt is not established with a provider, would they like to be referred to a provider to establish care? No .   Dental Screening: Recommended annual dental exams for proper oral hygiene  Community Resource Referral / Chronic Care Management: CRR required this visit?  No   CCM required this visit?  No      Plan:     I have personally reviewed and noted the following in the patient's chart:   Medical and social history Use of alcohol, tobacco or illicit drugs  Current medications and supplements including opioid prescriptions.  Functional ability and status Nutritional  status Physical activity Advanced directives List of other physicians Hospitalizations, surgeries, and ER visits in previous 12 months Vitals Screenings to include cognitive, depression, and falls Referrals and appointments  In addition, I have reviewed and discussed with patient certain preventive protocols, quality metrics, and best practice recommendations. A written personalized care plan for preventive services as well as general preventive health recommendations were provided to patient.     Sandrea Hammond, LPN   [id number]   Nurse Notes: None

## 2021-06-21 NOTE — Patient Instructions (Signed)
[name] , Thank you for taking time to come for your Medicare Wellness Visit. I appreciate your ongoing commitment to your health goals. Please review the following plan we discussed and let me know if I can assist you in the future.   Screening recommendations/referrals: Colonoscopy: Done 05/30/2021 - Had CT to see further images of polyp - may have to undergo surgery - follow up with GI in 2-3 weeks Mammogram: Done 12/01/2019 - Repeat annually *postponing for now until she knows whether or not she has to have surgery Bone Density: Done 06/15/2018 - Repeat in 5 years Recommended yearly ophthalmology/optometry visit for glaucoma screening and checkup Recommended yearly dental visit for hygiene and checkup  Vaccinations: Influenza vaccine: Done 08/31/2020 - Repeat annually Pneumococcal vaccine: Done 2009, maybe second dose done years ago as well, although we cannot find record of this - okay to repeat if desired Tdap vaccine: Due. Every 10 years Shingles vaccine: Due. Shingrix discussed. Please contact your pharmacy for coverage information.     Covid-19: Done 03/29/20 & 03/12/20  Advanced directives: Please bring a copy of your health care power of attorney and living will to the office to be added to your chart at your convenience.   Conditions/risks identified: Aim for 30 minutes of exercise or brisk walking each day, drink 6-8 glasses of water and eat lots of fruits and vegetables.   Next appointment: Follow up in one year for your annual wellness visit    Preventive Care 65 Years and Older, Female Preventive care refers to lifestyle choices and visits with your health care provider that can promote health and wellness. What does preventive care include? A yearly physical exam. This is also called an annual well check. Dental exams once or twice a year. Routine eye exams. Ask your health care provider how often you should have your eyes checked. Personal lifestyle choices,  including: Daily care of your teeth and gums. Regular physical activity. Eating a healthy diet. Avoiding tobacco and drug use. Limiting alcohol use. Practicing safe sex. Taking low-dose aspirin every day. Taking vitamin and mineral supplements as recommended by your health care provider. What happens during an annual well check? The services and screenings done by your health care provider during your annual well check will depend on your age, overall health, lifestyle risk factors, and family history of disease. Counseling  Your health care provider may ask you questions about your: Alcohol use. Tobacco use. Drug use. Emotional well-being. Home and relationship well-being. Sexual activity. Eating habits. History of falls. Memory and ability to understand (cognition). Work and work Statistician. Reproductive health. Screening  You may have the following tests or measurements: Height, weight, and BMI. Blood pressure. Lipid and cholesterol levels. These may be checked every 5 years, or more frequently if you are over 76 years old. Skin check. Lung cancer screening. You may have this screening every year starting at age 12 if you have a 30-pack-year history of smoking and currently smoke or have quit within the past 15 years. Fecal occult blood test (FOBT) of the stool. You may have this test every year starting at age 71. Flexible sigmoidoscopy or colonoscopy. You may have a sigmoidoscopy every 5 years or a colonoscopy every 10 years starting at age 13. Hepatitis C blood test. Hepatitis B blood test. Sexually transmitted disease (STD) testing. Diabetes screening. This is done by checking your blood sugar (glucose) after you have not eaten for a while (fasting). You may have this done every 1-3 years.  Bone density scan. This is done to screen for osteoporosis. You may have this done starting at age 32. Mammogram. This may be done every 1-2 years. Talk to your health care provider  about how often you should have regular mammograms. Talk with your health care provider about your test results, treatment options, and if necessary, the need for more tests. Vaccines  Your health care provider may recommend certain vaccines, such as: Influenza vaccine. This is recommended every year. Tetanus, diphtheria, and acellular pertussis (Tdap, Td) vaccine. You may need a Td booster every 10 years. Zoster vaccine. You may need this after age 35. Pneumococcal 13-valent conjugate (PCV13) vaccine. One dose is recommended after age 75. Pneumococcal polysaccharide (PPSV23) vaccine. One dose is recommended after age 73. Talk to your health care provider about which screenings and vaccines you need and how often you need them. This information is not intended to replace advice given to you by your health care provider. Make sure you discuss any questions you have with your health care provider. Document Released: 11/16/2015 Document Revised: 07/09/2016 Document Reviewed: 08/21/2015 Elsevier Interactive Patient Education  2017 [place] Prevention in the Home Falls can cause injuries. They can happen to people of all ages. There are many things you can do to make your home safe and to help prevent falls. What can I do on the outside of my home? Regularly fix the edges of walkways and driveways and fix any cracks. Remove anything that might make you trip as you walk through a door, such as a raised step or threshold. Trim any bushes or trees on the path to your home. Use bright outdoor lighting. Clear any walking paths of anything that might make someone trip, such as rocks or tools. Regularly check to see if handrails are loose or broken. Make sure that both sides of any steps have handrails. Any raised decks and porches should have guardrails on the edges. Have any leaves, snow, or ice cleared regularly. Use sand or salt on walking paths during winter. Clean up any spills in  your garage right away. This includes oil or grease spills. What can I do in the bathroom? Use night lights. Install grab bars by the toilet and in the tub and shower. Do not use towel bars as grab bars. Use non-skid mats or decals in the tub or shower. If you need to sit down in the shower, use a plastic, non-slip stool. Keep the floor dry. Clean up any water that spills on the floor as soon as it happens. Remove soap buildup in the tub or shower regularly. Attach bath mats securely with double-sided non-slip rug tape. Do not have throw rugs and other things on the floor that can make you trip. What can I do in the bedroom? Use night lights. Make sure that you have a light by your bed that is easy to reach. Do not use any sheets or blankets that are too big for your bed. They should not hang down onto the floor. Have a firm chair that has side arms. You can use this for support while you get dressed. Do not have throw rugs and other things on the floor that can make you trip. What can I do in the kitchen? Clean up any spills right away. Avoid walking on wet floors. Keep items that you use a lot in easy-to-reach places. If you need to reach something above you, use a strong step stool that has a grab bar.  Keep electrical cords out of the way. Do not use floor polish or wax that makes floors slippery. If you must use wax, use non-skid floor wax. Do not have throw rugs and other things on the floor that can make you trip. What can I do with my stairs? Do not leave any items on the stairs. Make sure that there are handrails on both sides of the stairs and use them. Fix handrails that are broken or loose. Make sure that handrails are as long as the stairways. Check any carpeting to make sure that it is firmly attached to the stairs. Fix any carpet that is loose or worn. Avoid having throw rugs at the top or bottom of the stairs. If you do have throw rugs, attach them to the floor with carpet  tape. Make sure that you have a light switch at the top of the stairs and the bottom of the stairs. If you do not have them, ask someone to add them for you. What else can I do to help prevent falls? Wear shoes that: Do not have high heels. Have rubber bottoms. Are comfortable and fit you well. Are closed at the toe. Do not wear sandals. If you use a stepladder: Make sure that it is fully opened. Do not climb a closed stepladder. Make sure that both sides of the stepladder are locked into place. Ask someone to hold it for you, if possible. Clearly mark and make sure that you can see: Any grab bars or handrails. First and last steps. Where the edge of each step is. Use tools that help you move around (mobility aids) if they are needed. These include: Canes. Walkers. Scooters. Crutches. Turn on the lights when you go into a dark area. Replace any light bulbs as soon as they burn out. Set up your furniture so you have a clear path. Avoid moving your furniture around. If any of your floors are uneven, fix them. If there are any pets around you, be aware of where they are. Review your medicines with your doctor. Some medicines can make you feel dizzy. This can increase your chance of falling. Ask your doctor what other things that you can do to help prevent falls. This information is not intended to replace advice given to you by your health care provider. Make sure you discuss any questions you have with your health care provider. Document Released: 08/16/2009 Document Revised: 03/27/2016 Document Reviewed: 11/24/2014 Elsevier Interactive Patient Education  2017 [employer].

## 2021-06-26 ENCOUNTER — Ambulatory Visit: Payer: Medicare HMO | Admitting: Physician Assistant

## 2021-06-28 ENCOUNTER — Ambulatory Visit (INDEPENDENT_AMBULATORY_CARE_PROVIDER_SITE_OTHER): Payer: Medicare HMO | Admitting: Gastroenterology

## 2021-06-28 ENCOUNTER — Other Ambulatory Visit: Payer: Self-pay

## 2021-06-28 ENCOUNTER — Encounter: Payer: Self-pay | Admitting: Gastroenterology

## 2021-06-28 VITALS — BP 118/70 | HR 60 | Ht 65.0 in | Wt 210.0 lb

## 2021-06-28 DIAGNOSIS — Z86010 Personal history of colonic polyps: Secondary | ICD-10-CM

## 2021-06-28 DIAGNOSIS — Z9981 Dependence on supplemental oxygen: Secondary | ICD-10-CM

## 2021-06-28 DIAGNOSIS — K219 Gastro-esophageal reflux disease without esophagitis: Secondary | ICD-10-CM

## 2021-06-28 DIAGNOSIS — I4891 Unspecified atrial fibrillation: Secondary | ICD-10-CM

## 2021-06-28 DIAGNOSIS — Z7901 Long term (current) use of anticoagulants: Secondary | ICD-10-CM

## 2021-06-28 NOTE — Patient Instructions (Signed)
If you are age 72 or older, your body mass index should be between 23-30. Your Body mass index is 34.95 kg/m. If this is out of the aforementioned range listed, please consider follow up with your Primary Care Provider.  If you are age 23 or younger, your body mass index should be between 19-25. Your Body mass index is 34.95 kg/m. If this is out of the aformentioned range listed, please consider follow up with your Primary Care Provider.   __________________________________________________________  The [facility] providers would like to encourage you to use [facility] to communicate with providers for non-urgent requests or questions.  Due to long hold times on the telephone, sending your provider a message by [facility] may be a faster and more efficient way to get a response.  Please allow 48 business hours for a response.  Please remember that this is for non-urgent requests.   Please call us in 6 months to schedule an office visit.  It was a pleasure to see you today!  Vito Cirigliano, D.O.

## 2021-06-28 NOTE — Progress Notes (Signed)
Chief Complaint:    Colonoscopy follow-up, polypoid colon lesion  GI History: 70 year old female with a past medical history of COPD on 2 L O2 continuously, GERD, A. fib on Eliquis (11/05/2018 echo with LVEF 55-60%), HLD.  Initially seen in the GI clinic on 04/11/2021 for evaluation of positive Cologuard from 02/2021.   Endoscopic History: - Colonoscopy (2010): 2 benign polyps per patient, with recommendation to repeat in 10 years. - Colonoscopy (05/30/2021): 7 subcentimeter tubular adenomas resected from the ascending/transverse/descending/sigmoid. Large noncircumferential villous mass in proximal transverse colon with mild contact oozing and central tethering-biopsied and tattoo placed 3 cm distal to lesion (path: Tubular adenoma without HGD or malignancy).   Left-sided diverticulosis with significant tortuosity of sigmoid.  Internal hemorrhoids.  Normal TI  - 06/2021: CEA normal - 06/20/2021: CT C/A/P: Polypoid filling defect in hepatic flexure which correlates with colonoscopy finding.  Otherwise no adenopathy or metastasis.  Stable chronic lung disease.   HPI:     Patient is a 70 y.o. female presenting to the Gastroenterology Clinic for follow-up.  Colonoscopy last month with large polypoid mass in the proximal transverse colon/hepatic flexure.  Given endoscopic appearance and central tethering, this was not endoscopically resectable.  Surface mucosal biopsies demonstrates tubular adenoma without high-grade dysplasia or frank malignancy.  Subsequent CEA normal and CT C/A/P without evidence of metastasis.  Referral placed to Colorectal Surgery; appointment scheduled for 07/16/2021.  Today, she states she is otherwise in her usual state of health.  No complaints today.  Separately, does have history of reflux.  Well-controlled Protonix 40 mg/day.   Review of systems:     No chest pain, no SOB, no fevers, no urinary sx   Past Medical History:  Diagnosis Date   Colon polyps    COPD  (chronic obstructive pulmonary disease) (HCC)    GERD (gastroesophageal reflux disease)    Hypercholesteremia    Mild mitral regurgitation    Mitral valve prolapse    a. dx 1985, not seen on echoes more recently.   Obesity    Paroxysmal atrial fibrillation (HCC)    Paroxysmal atrial flutter ([place])    a. dx 05/2017.    Patient's surgical history, family medical history, social history, medications and allergies were all reviewed in Epic    Current Outpatient Medications  Medication Sig Dispense Refill   albuterol (PROAIR HFA) 108 (90 Base) MCG/ACT inhaler Inhale 2 puffs into the lungs every 6 (six) hours as needed for wheezing or shortness of breath. 1 Inhaler 11   albuterol (PROVENTIL) (2.5 MG/3ML) 0.083% nebulizer solution Take 3 mLs (2.5 mg total) by nebulization every 6 (six) hours as needed for wheezing or shortness of breath. 240 mL 1   Bempedoic Acid-Ezetimibe (NEXLIZET) 180-10 MG TABS Take 1 tablet by mouth daily. 90 tablet 3   Cholecalciferol (VITAMIN D3) 5000 UNITS CAPS Take 5,000 Units by mouth daily.      Cyanocobalamin (VITAMIN B-12 IJ) Inject 1,000 mcg into the muscle every 30 (thirty) days.     ELIQUIS 5 MG TABS tablet TAKE (1) TABLET TWICE A DAY. (Patient taking differently: Take 5 mg by mouth 2 (two) times daily.) 180 tablet 0   guaiFENesin (MUCINEX) 600 MG 12 hr tablet Take 600 mg by mouth every morning.      metoprolol tartrate (LOPRESSOR) 25 MG tablet Take 1 tablet (25 mg total) by mouth 2 (two) times daily. 180 tablet 0   mirabegron ER (MYRBETRIQ) 25 MG TB24 tablet Take 1 tablet (25 mg total)  by mouth daily. (NEEDS TO BE SEEN BEFORE NEXT REFILL) 30 tablet 0   montelukast (SINGULAIR) 10 MG tablet TAKE ONE TABLET AT BEDTIME (Patient taking differently: Take 10 mg by mouth at bedtime. TAKE ONE TABLET AT BEDTIME) 90 tablet 0   OXYGEN Inhale 2 L into the lungs continuous.     pantoprazole (PROTONIX) 40 MG tablet Take 1 tablet (40 mg total) by mouth daily. 90 tablet 1    TRELEGY ELLIPTA 100-62.5-25 MCG/INH AEPB INHALE ONE PUFF EVERY DAY (Patient taking differently: Inhale 1 puff into the lungs daily.) 60 each 2   VASCEPA 1 g capsule TAKE (2) CAPSULES TWICE DAILY. (Patient taking differently: Take 2 g by mouth 2 (two) times daily. TAKE (2) CAPSULES TWICE DAILY.) 120 capsule 2   Current Facility-Administered Medications  Medication Dose Route Frequency Provider Last Rate Last Admin   cyanocobalamin ((VITAMIN B-12)) injection 1,000 mcg  1,000 mcg Intramuscular Q30 days Loman Brooklyn, FNP   1,000 mcg at 06/13/21 1024    Physical Exam:     BP 118/70   Pulse 60   Ht '5\' 5"'$  (1.651 m)   Wt 210 lb (95.3 kg)   SpO2 93% Comment: @ 2 Lts  BMI 34.95 kg/m   GENERAL:  Pleasant female in NAD Pulmonary nasal cannula ABDOMEN:  Nondistended, soft, nontender.  Musculoskeletal:  Normal muscle tone, normal strength NEURO: Alert and oriented x 3, no focal neurologic deficits   IMPRESSION and PLAN:    1) History of colon polyps 2) Large polypoid mass in colon 3) COPD on home O2 4) A. fib on Eliquis  - Has appointment scheduled Dr. Dema Severin in Colorectal Surgery on 07/16/2021 to discuss surgical resection.  Based on endoscopic appearance, unlikely that this can be successfully resected endoscopically given central tethering.  We did discuss those possibilities at length again today, to include EMR, ESD.  However, as this would likely require multiple colonoscopies for successful eradication, she would prefer to explore surgical route. - Discussed surgery in context of her underlying comorbidities, specifically COPD.  She will plan to discuss this at further length with Dr. Dema Severin - PEA normal - CT C/A/P without evidence of metastasis - Depending on surgical will dictate when/if repeat colonoscopy for surveillance needs to be completed  5) GERD - Well-controlled on current therapy - Continue antireflux lifestyle/dietary modifications  Can follow-up with me in 6 months  or sooner as needed         Lavena Bullion ,DO, FACG 06/28/2021, 10:52 AM

## 2021-06-30 NOTE — Progress Notes (Signed)
PCP:  Loman Brooklyn, FNP Primary Cardiologist: None Electrophysiologist: Thompson Grayer, MD   [name] is a 70 y.o. female seen today for Thompson Grayer, MD for routine electrophysiology followup.  Since last being seen in our clinic the patient reports doing very well. She continues to have intermittent palpitations 1-2 times a month. They only last for about 30 minutes at most. Otherwise, she is doing well. Remains on chronic O2. SOB with moderate exertion at baseline. she denies chest pain, PND, orthopnea, nausea, vomiting, dizziness, syncope, edema, weight gain, or early satiety.  Past Medical History:  Diagnosis Date   Colon polyps    COPD (chronic obstructive pulmonary disease) (HCC)    GERD (gastroesophageal reflux disease)    Hypercholesteremia    Mild mitral regurgitation    Mitral valve prolapse    a. dx 1985, not seen on echoes more recently.   Obesity    Paroxysmal atrial fibrillation (HCC)    Paroxysmal atrial flutter ([place])    a. dx 05/2017.   Past Surgical History:  Procedure Laterality Date   ABDOMINAL HYSTERECTOMY     ATRIAL FIBRILLATION ABLATION N/A 05/04/2018   Procedure: ATRIAL FIBRILLATION ABLATION;  Surgeon: Thompson Grayer, MD;  Location: [place] [facility];  Service: Cardiovascular;  Laterality: N/A;   BIOPSY  05/30/2021   Procedure: BIOPSY;  Surgeon: Lavena Bullion, DO;  [facility];  Service: Gastroenterology;;   CARDIOVERSION N/A 10/08/2017   Procedure: CARDIOVERSION;  Surgeon: Herminio Commons, MD;  [facility];  Service: Cardiovascular;  Laterality: N/A;   CESAREAN SECTION     COLONOSCOPY WITH PROPOFOL N/A 05/30/2021   Procedure: COLONOSCOPY WITH PROPOFOL;  Surgeon: Lavena Bullion, DO;  [facility];  Service: Gastroenterology;  Laterality: N/A;   HEMOSTASIS CLIP PLACEMENT  05/30/2021   Procedure: HEMOSTASIS CLIP PLACEMENT;  Surgeon: Lavena Bullion, DO;  [facility];  Service: Gastroenterology;;    POLYPECTOMY  05/30/2021   Procedure: POLYPECTOMY;  Surgeon: Lavena Bullion, DO;  [facility];  Service: Gastroenterology;;   SUBMUCOSAL TATTOO INJECTION  05/30/2021   Procedure: SUBMUCOSAL TATTOO INJECTION;  Surgeon: Lavena Bullion, DO;  [facility];  Service: Gastroenterology;;   TEE WITHOUT CARDIOVERSION N/A 05/03/2018   Procedure: TRANSESOPHAGEAL ECHOCARDIOGRAM (TEE);  Surgeon: Fay Records, MD;  Location: [facility];  Service: Cardiovascular;  Laterality: N/A;   TUBAL LIGATION      Current Outpatient Medications  Medication Sig Dispense Refill   albuterol (PROAIR HFA) 108 (90 Base) MCG/ACT inhaler Inhale 2 puffs into the lungs every 6 (six) hours as needed for wheezing or shortness of breath. 1 Inhaler 11   albuterol (PROVENTIL) (2.5 MG/3ML) 0.083% nebulizer solution Take 3 mLs (2.5 mg total) by nebulization every 6 (six) hours as needed for wheezing or shortness of breath. 240 mL 1   Bempedoic Acid-Ezetimibe (NEXLIZET) 180-10 MG TABS Take 1 tablet by mouth daily. 90 tablet 3   Cholecalciferol (VITAMIN D3) 5000 UNITS CAPS Take 5,000 Units by mouth daily.      Cyanocobalamin (VITAMIN B-12 IJ) Inject 1,000 mcg into the muscle every 30 (thirty) days.     ELIQUIS 5 MG TABS tablet TAKE (1) TABLET TWICE A DAY. 180 tablet 0   guaiFENesin (MUCINEX) 600 MG 12 hr tablet Take 600 mg by mouth every morning.      metoprolol tartrate (LOPRESSOR) 25 MG tablet Take 1 tablet (25 mg total) by mouth 2 (two) times daily. 180 tablet 0  mirabegron ER (MYRBETRIQ) 25 MG TB24 tablet Take 1 tablet (25 mg total) by mouth daily. (NEEDS TO BE SEEN BEFORE NEXT REFILL) 30 tablet 0   montelukast (SINGULAIR) 10 MG tablet TAKE ONE TABLET AT BEDTIME 90 tablet 0   OXYGEN Inhale 2 L into the lungs continuous.     pantoprazole (PROTONIX) 40 MG tablet Take 1 tablet (40 mg total) by mouth daily. 90 tablet 1   TRELEGY ELLIPTA 100-62.5-25 MCG/INH AEPB INHALE ONE PUFF EVERY DAY 60 each 2   VASCEPA  1 g capsule TAKE (2) CAPSULES TWICE DAILY. 120 capsule 2   Current Facility-Administered Medications  Medication Dose Route Frequency Provider Last Rate Last Admin   cyanocobalamin ((VITAMIN B-12)) injection 1,000 mcg  1,000 mcg Intramuscular Q30 days Loman Brooklyn, FNP   1,000 mcg at 06/13/21 1024    Allergies  Allergen Reactions   Penicillins Rash    Has patient had a PCN reaction causing immediate rash, facial/tongue/throat swelling, SOB or lightheadedness with hypotension: Yes Has patient had a PCN reaction causing severe rash involving mucus membranes or skin necrosis: Yes Has patient had a PCN reaction that required hospitalization: No Has patient had a PCN reaction occurring within the last 10 years: No If all of the above answers are "NO", then may proceed with Cephalosporin use.    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   Occupation: [employer]     Comment: retired  Tobacco Use   Smoking status: Former    Packs/day: 1.00    Years: 20.00    Pack years: 20.00    Types: Cigarettes    Quit date: 08/14/1990    Years since quitting: 30.9   Smokeless tobacco: Never  Vaping Use   Vaping Use: Never used  Substance and Sexual Activity   Alcohol use: No    Alcohol/week: 0.0 standard drinks   Drug use: No   Sexual activity: Not Currently  Other Topics Concern   Not on file  Social History Narrative   Lives home with her husband   Oxygen dependent   Involved in church - socially active   Not very physically active, but doesn't have to use assistive device to walk   Social Determinants of Health   Financial Resource Strain: Low Risk    Difficulty of Paying Living Expenses: Not hard  at all  Food Insecurity: No Food Insecurity   Worried About Charity fundraiser in the Last Year: Never true   Arboriculturist in the Last Year: Never true  Transportation Needs: No Transportation Needs   Lack of Transportation (Medical): No   Lack of Transportation (Non-Medical): No  Physical Activity: Inactive   Days of Exercise per Week: 0 days   Minutes of Exercise per Session: 0 min  Stress: No Stress Concern Present   Feeling of Stress : Not at all  Social Connections: Socially Integrated   Frequency of Communication with Friends and Family: More than three times a week   Frequency of Social Gatherings with Friends and Family: More than three times a week   Attends Religious Services: More than 4 times  per year   Active Member of Clubs or Organizations: Yes   Attends Archivist Meetings: More than 4 times per year   Marital Status: Married  Human resources officer Violence: Not At Risk   Fear of Current or Ex-Partner: No   Emotionally Abused: No   Physically Abused: No   Sexually Abused: No     Review of Systems: All other systems reviewed and are otherwise negative except as noted above.  Physical Exam: Vitals:   07/01/21 1122  BP: 120/70  Pulse: 69  SpO2: 96%  Weight: 210 lb (95.3 kg)  Height: '5\' 5"'$  (1.651 m)    GEN- The patient is well appearing, alert and oriented x 3 today.   HEENT: normocephalic, atraumatic; sclera clear, conjunctiva pink; hearing intact; oropharynx clear; neck supple, no JVP Lymph- no cervical lymphadenopathy Lungs- Clear to ausculation bilaterally, normal work of breathing.  No wheezes, rales, rhonchi Heart- Regular rate and rhythm, no murmurs, rubs or gallops, PMI not laterally displaced GI- soft, non-tender, non-distended, bowel sounds present, no hepatosplenomegaly Extremities- no clubbing, cyanosis, or edema; DP/PT/radial pulses 2+ bilaterally MS- no significant deformity or atrophy Skin- warm and dry, no rash or lesion Psych-  euthymic mood, full affect Neuro- strength and sensation are intact  EKG is ordered. Personal review of EKG from today shows NSR at 69 bpm.  Additional studies reviewed include: Previous EP office notes.   Assessment and Plan:  1. Persistent Afib, and flutter S/p PVI and CTI ablation with Dr. Rayann Heman Continue Eliquis for CHA2DS2-VASc of at lesat 3.       2. Diastolic dysfunction Echo 11/2018 LVEF 55-60% Volume status stable at this time.   3. COPD Managed with her PMD team Uses O2 chronically.       Recent labs 06/13/21 stable.  Shirley Friar, PA-C  07/01/21 11:32 AM

## 2021-07-01 ENCOUNTER — Other Ambulatory Visit: Payer: Self-pay

## 2021-07-01 ENCOUNTER — Encounter: Payer: Self-pay | Admitting: Student

## 2021-07-01 ENCOUNTER — Ambulatory Visit: Payer: Medicare HMO | Admitting: Student

## 2021-07-01 VITALS — BP 120/70 | HR 69 | Ht 65.0 in | Wt 210.0 lb

## 2021-07-01 DIAGNOSIS — I4819 Other persistent atrial fibrillation: Secondary | ICD-10-CM | POA: Diagnosis not present

## 2021-07-01 DIAGNOSIS — I5189 Other ill-defined heart diseases: Secondary | ICD-10-CM

## 2021-07-01 DIAGNOSIS — I10 Essential (primary) hypertension: Secondary | ICD-10-CM | POA: Diagnosis not present

## 2021-07-01 NOTE — Patient Instructions (Signed)

## 2021-07-10 DIAGNOSIS — J449 Chronic obstructive pulmonary disease, unspecified: Secondary | ICD-10-CM | POA: Diagnosis not present

## 2021-07-15 ENCOUNTER — Other Ambulatory Visit: Payer: Self-pay

## 2021-07-15 ENCOUNTER — Ambulatory Visit (INDEPENDENT_AMBULATORY_CARE_PROVIDER_SITE_OTHER): Payer: Medicare HMO

## 2021-07-15 DIAGNOSIS — E538 Deficiency of other specified B group vitamins: Secondary | ICD-10-CM | POA: Diagnosis not present

## 2021-07-15 NOTE — Progress Notes (Signed)
Cyanocobalamin injection given to left deltoid.  Patient tolerated well. 

## 2021-07-16 DIAGNOSIS — K635 Polyp of colon: Secondary | ICD-10-CM | POA: Diagnosis not present

## 2021-07-18 ENCOUNTER — Other Ambulatory Visit: Payer: Self-pay | Admitting: Nurse Practitioner

## 2021-07-18 ENCOUNTER — Other Ambulatory Visit: Payer: Self-pay | Admitting: Family Medicine

## 2021-07-18 DIAGNOSIS — N3281 Overactive bladder: Secondary | ICD-10-CM

## 2021-07-18 DIAGNOSIS — I4819 Other persistent atrial fibrillation: Secondary | ICD-10-CM

## 2021-07-18 NOTE — Telephone Encounter (Signed)
Britney NTBS 30 days given 06/11/21

## 2021-07-30 ENCOUNTER — Other Ambulatory Visit: Payer: Self-pay | Admitting: Family Medicine

## 2021-07-30 DIAGNOSIS — J449 Chronic obstructive pulmonary disease, unspecified: Secondary | ICD-10-CM

## 2021-08-09 DIAGNOSIS — J449 Chronic obstructive pulmonary disease, unspecified: Secondary | ICD-10-CM | POA: Diagnosis not present

## 2021-08-14 ENCOUNTER — Other Ambulatory Visit: Payer: Self-pay

## 2021-08-14 ENCOUNTER — Ambulatory Visit (INDEPENDENT_AMBULATORY_CARE_PROVIDER_SITE_OTHER): Payer: Medicare HMO | Admitting: Family Medicine

## 2021-08-14 DIAGNOSIS — E538 Deficiency of other specified B group vitamins: Secondary | ICD-10-CM | POA: Diagnosis not present

## 2021-08-15 ENCOUNTER — Other Ambulatory Visit: Payer: Self-pay | Admitting: Family Medicine

## 2021-08-15 DIAGNOSIS — I4819 Other persistent atrial fibrillation: Secondary | ICD-10-CM

## 2021-08-15 NOTE — Telephone Encounter (Signed)
[name] NTBS 30 days given 07/18/21

## 2021-08-16 ENCOUNTER — Other Ambulatory Visit: Payer: Self-pay | Admitting: Family Medicine

## 2021-08-16 DIAGNOSIS — J449 Chronic obstructive pulmonary disease, unspecified: Secondary | ICD-10-CM

## 2021-08-16 NOTE — Telephone Encounter (Signed)
Britney NTBS 30 days given 07/30/21

## 2021-08-21 ENCOUNTER — Other Ambulatory Visit: Payer: Self-pay

## 2021-08-21 ENCOUNTER — Ambulatory Visit (INDEPENDENT_AMBULATORY_CARE_PROVIDER_SITE_OTHER): Payer: Medicare HMO | Admitting: Family Medicine

## 2021-08-21 ENCOUNTER — Encounter: Payer: Self-pay | Admitting: Family Medicine

## 2021-08-21 VITALS — BP 133/74 | HR 70 | Temp 97.3°F | Ht 65.0 in | Wt 210.6 lb

## 2021-08-21 DIAGNOSIS — I4819 Other persistent atrial fibrillation: Secondary | ICD-10-CM | POA: Diagnosis not present

## 2021-08-21 DIAGNOSIS — D369 Benign neoplasm, unspecified site: Secondary | ICD-10-CM

## 2021-08-21 DIAGNOSIS — E7800 Pure hypercholesterolemia, unspecified: Secondary | ICD-10-CM

## 2021-08-21 DIAGNOSIS — E538 Deficiency of other specified B group vitamins: Secondary | ICD-10-CM

## 2021-08-21 DIAGNOSIS — K219 Gastro-esophageal reflux disease without esophagitis: Secondary | ICD-10-CM

## 2021-08-21 DIAGNOSIS — N3281 Overactive bladder: Secondary | ICD-10-CM

## 2021-08-21 DIAGNOSIS — J449 Chronic obstructive pulmonary disease, unspecified: Secondary | ICD-10-CM | POA: Diagnosis not present

## 2021-08-21 MED ORDER — MIRABEGRON ER 25 MG PO TB24: 25.0000 mg | ORAL_TABLET | Freq: Every day | ORAL | 1 refills | Status: DC

## 2021-08-21 MED ORDER — METOPROLOL TARTRATE 25 MG PO TABS: 25.0000 mg | ORAL_TABLET | Freq: Two times a day (BID) | ORAL | 1 refills | Status: DC

## 2021-08-21 MED ORDER — APIXABAN 5 MG PO TABS: 5.0000 mg | ORAL_TABLET | Freq: Two times a day (BID) | ORAL | 1 refills | Status: AC

## 2021-08-21 MED ORDER — TRELEGY ELLIPTA 100-62.5-25 MCG/ACT IN AEPB: 1.0000 | INHALATION_SPRAY | Freq: Every day | RESPIRATORY_TRACT | 5 refills | Status: DC

## 2021-08-21 MED ORDER — MONTELUKAST SODIUM 10 MG PO TABS: 10.0000 mg | ORAL_TABLET | Freq: Every day | ORAL | 1 refills | Status: AC

## 2021-08-21 MED ORDER — NEXLIZET 180-10 MG PO TABS: 1.0000 | ORAL_TABLET | Freq: Every day | ORAL | 1 refills | Status: DC

## 2021-08-21 NOTE — Progress Notes (Signed)
Assessment & Plan:  1. COPD  GOLD III Well controlled on current regimen.  - montelukast (SINGULAIR) 10 MG tablet; Take 1 tablet (10 mg total) by mouth at bedtime.  Dispense: 90 tablet; Refill: 1 - Fluticasone-Umeclidin-Vilant (TRELEGY ELLIPTA) 100-62.5-25 MCG/ACT AEPB; Inhale 1 puff into the lungs daily.  Dispense: 60 each; Refill: 5  2. Persistent atrial fibrillation (HCC) Well controlled on current regimen. Managed by cardiology. - apixaban (ELIQUIS) 5 MG TABS tablet; Take 1 tablet (5 mg total) by mouth 2 (two) times daily.  Dispense: 180 tablet; Refill: 1 - metoprolol tartrate (LOPRESSOR) 25 MG tablet; Take 1 tablet (25 mg total) by mouth 2 (two) times daily.  Dispense: 180 tablet; Refill: 1 - CBC with Differential/Platelet - CMP14+EGFR  3. Hypercholesteremia Well controlled on current regimen.  - Bempedoic Acid-Ezetimibe (NEXLIZET) 180-10 MG TABS; Take 1 tablet by mouth daily.  Dispense: 90 tablet; Refill: 1 - Lipid panel  4. OAB (overactive bladder) Well controlled on current regimen.  - mirabegron ER (MYRBETRIQ) 25 MG TB24 tablet; Take 1 tablet (25 mg total) by mouth daily.  Dispense: 90 tablet; Refill: 1 - CMP14+EGFR  5. B12 deficiency Reassessing levels today. - CBC with Differential/Platelet - Vitamin B12  6. Gastroesophageal reflux disease, unspecified whether esophagitis present Well controlled on current regimen.  - CMP14+EGFR  7. Tubular adenoma Patient to continue following with [facility] until taken care of.   Return in about 6 months (around 02/19/2022) for annual physical.  Marie Limes, MSN, APRN, FNP-C [name]  Subjective:    Patient ID: [name], female    DOB: 24-Sep-1951, 70 y.o.   MRN: [id number]  Patient Care Team: Marie Brooklyn, FNP as PCP - General (Family Medicine) Marie Grayer, MD as PCP - Electrophysiology (Cardiology) Marie Grayer, MD as Consulting Physician (Cardiology) Lavera Orozco, [facility] as  Pharmacist (Family Medicine) Marie Bullion, DO as Consulting Physician (Gastroenterology)   Chief Complaint:  Chief Complaint  Patient presents with   Medical Management of Chronic Issues    HPI: [name] is a 70 y.o. female presenting on 08/21/2021 for Medical Management of Chronic Issues  COPD Using Trelegy daily. Patient reports she hardly ever has to use her Albuterol inhaler. Also taking Singulair and Mucinex.   Atrial Fibrillation Managed by cardiology. Taking Eliquis and Metoprolol.  Hypercholesterolemia Taking Nexlizet and Vascepa.   Overactive Bladder Taking Myrbetriq.   Vitamin B12 Deficiency Patient is getting monthly B12 injections. Her last vitamin B12 level was low at 146 in December 2021.   GERD Taking pantoprazole.   Tubular Adenoma Patient was referred to [facility] for possible hemicolectomy. She has not yet heard back regarding scheduling and states she will call them in a week or so if she has not heard anything by then.   New complaints: None   Social history:  Relevant past medical, surgical, family and social history reviewed and updated as indicated. Interim medical history since our last visit reviewed.  Allergies and medications reviewed and updated.  DATA REVIEWED: CHART IN EPIC  ROS: Negative unless specifically indicated above in HPI.    Current Outpatient Medications:    albuterol (PROAIR HFA) 108 (90 Base) MCG/ACT inhaler, Inhale 2 puffs into the lungs every 6 (six) hours as needed for wheezing or shortness of breath., Disp: 1 Inhaler, Rfl: 11   albuterol (PROVENTIL) (2.5 MG/3ML) 0.083% nebulizer solution, Take 3 mLs (2.5 mg total) by nebulization every 6 (six) hours as needed for wheezing or shortness  of breath., Disp: 240 mL, Rfl: 1   Cholecalciferol (VITAMIN D3) 5000 UNITS CAPS, Take 5,000 Units by mouth daily. , Disp: , Rfl:    Cyanocobalamin (VITAMIN B-12 IJ), Inject 1,000 mcg into the muscle every 30 (thirty) days.,  Disp: , Rfl:    Fluticasone-Umeclidin-Vilant (TRELEGY ELLIPTA) 100-62.5-25 MCG/ACT AEPB, Inhale 1 puff into the lungs daily., Disp: 60 each, Rfl: 5   guaiFENesin (MUCINEX) 600 MG 12 hr tablet, Take 600 mg by mouth every morning. , Disp: , Rfl:    OXYGEN, Inhale 2 L into the lungs continuous., Disp: , Rfl:    pantoprazole (PROTONIX) 40 MG tablet, Take 1 tablet (40 mg total) by mouth daily., Disp: 90 tablet, Rfl: 1   VASCEPA 1 g capsule, TAKE (2) CAPSULES TWICE DAILY., Disp: 120 capsule, Rfl: 2   apixaban (ELIQUIS) 5 MG TABS tablet, Take 1 tablet (5 mg total) by mouth 2 (two) times daily., Disp: 180 tablet, Rfl: 1   Bempedoic Acid-Ezetimibe (NEXLIZET) 180-10 MG TABS, Take 1 tablet by mouth daily., Disp: 90 tablet, Rfl: 1   metoprolol tartrate (LOPRESSOR) 25 MG tablet, Take 1 tablet (25 mg total) by mouth 2 (two) times daily., Disp: 180 tablet, Rfl: 1   mirabegron ER (MYRBETRIQ) 25 MG TB24 tablet, Take 1 tablet (25 mg total) by mouth daily., Disp: 90 tablet, Rfl: 1   montelukast (SINGULAIR) 10 MG tablet, Take 1 tablet (10 mg total) by mouth at bedtime., Disp: 90 tablet, Rfl: 1  Current Facility-Administered Medications:    cyanocobalamin ((VITAMIN B-12)) injection 1,000 mcg, 1,000 mcg, Intramuscular, Q30 days, [name], Marie Upshaw F, FNP, 1,000 mcg at 08/14/21 1030   Allergies  Allergen Reactions   Penicillins Rash    Has patient had a PCN reaction causing immediate rash, facial/tongue/throat swelling, SOB or lightheadedness with hypotension: Yes Has patient had a PCN reaction causing severe rash involving mucus membranes or skin necrosis: Yes Has patient had a PCN reaction that required hospitalization: No Has patient had a PCN reaction occurring within the last 10 years: No If all of the above answers are "NO", then may proceed with Cephalosporin use.    Statins Other (See Comments)    myopathy myopathy   Dilaudid [Hydromorphone Hcl] Rash   Past Medical History:  Diagnosis Date   Colon polyps     COPD (chronic obstructive pulmonary disease) (HCC)    GERD (gastroesophageal reflux disease)    Hypercholesteremia    Mild mitral regurgitation    Mitral valve prolapse    a. dx 1985, not seen on echoes more recently.   Obesity    Paroxysmal atrial fibrillation (HCC)    Paroxysmal atrial flutter ([place])    a. dx 05/2017.    Past Surgical History:  Procedure Laterality Date   ABDOMINAL HYSTERECTOMY     ATRIAL FIBRILLATION ABLATION N/A 05/04/2018   Procedure: ATRIAL FIBRILLATION ABLATION;  Surgeon: Marie Grayer, MD;  Location: [place] [facility];  Service: Cardiovascular;  Laterality: N/A;   BIOPSY  05/30/2021   Procedure: BIOPSY;  Surgeon: Marie Bullion, DO;  [facility];  Service: Gastroenterology;;   CARDIOVERSION N/A 10/08/2017   Procedure: CARDIOVERSION;  Surgeon: Herminio Commons, MD;  [facility];  Service: Cardiovascular;  Laterality: N/A;   CESAREAN SECTION     COLONOSCOPY WITH PROPOFOL N/A 05/30/2021   Procedure: COLONOSCOPY WITH PROPOFOL;  Surgeon: Marie Bullion, DO;  [facility];  Service: Gastroenterology;  Laterality: N/A;   HEMOSTASIS CLIP PLACEMENT  05/30/2021   Procedure:  HEMOSTASIS CLIP PLACEMENT;  Surgeon: Marie Bullion, DO;  [facility];  Service: Gastroenterology;;   POLYPECTOMY  05/30/2021   Procedure: POLYPECTOMY;  Surgeon: Marie Bullion, DO;  [facility];  Service: Gastroenterology;;   SUBMUCOSAL TATTOO INJECTION  05/30/2021   Procedure: SUBMUCOSAL TATTOO INJECTION;  Surgeon: Marie Bullion, DO;  [facility];  Service: Gastroenterology;;   TEE WITHOUT CARDIOVERSION N/A 05/03/2018   Procedure: TRANSESOPHAGEAL ECHOCARDIOGRAM (TEE);  Surgeon: Fay Records, MD;  Location: [facility] END[facility]COPY;  Service: Cardiovascular;  Laterality: N/A;   TUBAL LIGATION      Social History   Socioeconomic History   Marital status: Married    Spouse name: Alvis   Number of children: 1   Years of  education: 12   Highest education level: 12th grade  Occupational History   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   Occupation: [employer]     Comment: retired  Tobacco Use   Smoking status: Former    Packs/day: 1.00    Years: 20.00    Pack years: 20.00    Types: Cigarettes    Quit date: 08/14/1990    Years since quitting: 31.0   Smokeless tobacco: Never  Vaping Use   Vaping Use: Never used  Substance and Sexual Activity   Alcohol use: No    Alcohol/week: 0.0 standard drinks   Drug use: No   Sexual activity: Not Currently  Other Topics Concern   Not on file  Social History Narrative   Lives home with her husband   Oxygen dependent   Involved in church - socially active   Not very physically active, but doesn't have to use assistive device to walk   Social Determinants of Health   Financial Resource Strain: Low Risk    Difficulty of Paying Living Expenses: Not hard at all  Food Insecurity: No Food Insecurity   Worried About Charity fundraiser in the Last Year: Never true   Arboriculturist in the Last Year: Never true  Transportation Needs: No Transportation Needs   Lack of Transportation (Medical): No   Lack of Transportation (Non-Medical): No  Physical Activity: Inactive   Days of Exercise per Week: 0 days   Minutes of Exercise per Session: 0 min  Stress: No Stress Concern Present   Feeling of Stress : Not at all  Social Connections: Socially Integrated   Frequency of Communication with Friends and Family: More than three times a week   Frequency of Social Gatherings with Friends and Family: More than three times a week   Attends Religious Services: More than 4 times per year   Active Member of [employer] or Organizations: Yes   Attends Music therapist: More than 4 times per year   Marital Status: Married  Human resources officer Violence: Not At Risk   Fear of Current or Ex-Partner: No   Emotionally Abused: No   Physically Abused: No    Sexually Abused: No        Objective:    BP 133/74   Pulse 70   Temp (!) 97.3 Orozco (36.3 C) (Temporal)   Ht _0  (1.651 m)   Wt 210 lb 9.6 oz (95.5 kg)   SpO2 93%   BMI 35.05 kg/m   Wt Readings from Last 3 Encounters:  08/21/21 210 lb 9.6 oz (95.5 kg)  07/01/21 210 lb (95.3 kg)  06/28/21 210 lb (95.3 kg)  Physical Exam Vitals reviewed.  Constitutional:      General: She is not in acute distress.    Appearance: Normal appearance. She is obese. She is not ill-appearing, toxic-appearing or diaphoretic.  HENT:     Head: Normocephalic and atraumatic.  Eyes:     General: No scleral icterus.       Right eye: No discharge.        Left eye: No discharge.     Conjunctiva/sclera: Conjunctivae normal.  Cardiovascular:     Rate and Rhythm: Normal rate and regular rhythm.     Heart sounds: Normal heart sounds.    No friction rub. No gallop.  Pulmonary:     Effort: Pulmonary effort is normal. No respiratory distress.     Breath sounds: Normal breath sounds. No stridor. No wheezing, rhonchi or rales.  Musculoskeletal:        General: Normal range of motion.     Cervical back: Normal range of motion.  Skin:    General: Skin is warm and dry.     Capillary Refill: Capillary refill takes less than 2 seconds.  Neurological:     General: No focal deficit present.     Mental Status: She is alert and oriented to person, place, and time. Mental status is at baseline.  Psychiatric:        Mood and Affect: Mood normal.        Behavior: Behavior normal.        Thought Content: Thought content normal.        Judgment: Judgment normal.    Lab Results  Component Value Date   TSH 2.390 06/20/2019   Lab Results  Component Value Date   WBC 8.5 01/07/2021   HGB 11.6 01/07/2021   HCT 33.9 (L) 01/07/2021   MCV 88 01/07/2021   PLT 474 (H) 01/07/2021   Lab Results  Component Value Date   NA 142 06/13/2021   K 5.0 06/13/2021   CO2 27 06/13/2021   GLUCOSE 105 (H) 06/13/2021    BUN 16 06/13/2021   CREATININE 0.84 06/13/2021   BILITOT 0.5 06/13/2021   ALKPHOS 84 06/13/2021   AST 23 06/13/2021   ALT 13 06/13/2021   PROT 6.5 06/13/2021   ALBUMIN 4.0 06/13/2021   CALCIUM 9.3 06/13/2021   ANIONGAP 8 05/04/2018   EGFR 74 06/13/2021   Lab Results  Component Value Date   CHOL 137 01/07/2021   Lab Results  Component Value Date   HDL 37 (L) 01/07/2021   Lab Results  Component Value Date   LDLCALC 76 01/07/2021   Lab Results  Component Value Date   TRIG 132 01/07/2021   Lab Results  Component Value Date   CHOLHDL 3.7 01/07/2021   No results found for: HGBA1C

## 2021-08-22 ENCOUNTER — Other Ambulatory Visit: Payer: Self-pay | Admitting: Family Medicine

## 2021-08-22 LAB — LIPID PANEL
Chol/HDL Ratio: 4 ratio (ref 0.0–4.4)
Cholesterol, Total: 137 mg/dL (ref 100–199)
HDL: 34 mg/dL — ABNORMAL LOW (ref >39)
LDL Chol Calc (NIH): 72 mg/dL (ref 0–99)
Triglycerides: 183 mg/dL — ABNORMAL HIGH (ref 0–149)
VLDL Cholesterol Cal: 31 mg/dL (ref 5–40)

## 2021-08-22 LAB — CBC WITH DIFFERENTIAL/PLATELET
Basophils Absolute: 0 10*3/uL (ref 0.0–0.2)
Basos: 0 %
EOS (ABSOLUTE): 0.2 10*3/uL (ref 0.0–0.4)
Eos: 2 %
Hematocrit: 35.1 % (ref 34.0–46.6)
Hemoglobin: 12 g/dL (ref 11.1–15.9)
Immature Grans (Abs): 0 10*3/uL (ref 0.0–0.1)
Immature Granulocytes: 0 %
Lymphocytes Absolute: 1.4 10*3/uL (ref 0.7–3.1)
Lymphs: 20 %
MCH: 29.9 pg (ref 26.6–33.0)
MCHC: 34.2 g/dL (ref 31.5–35.7)
MCV: 88 fL (ref 79–97)
Monocytes Absolute: 0.5 10*3/uL (ref 0.1–0.9)
Monocytes: 7 %
Neutrophils Absolute: 4.9 10*3/uL (ref 1.4–7.0)
Neutrophils: 71 %
Platelets: 394 10*3/uL (ref 150–450)
RBC: 4.01 x10E6/uL (ref 3.77–5.28)
RDW: 12.8 % (ref 11.7–15.4)
WBC: 7 10*3/uL (ref 3.4–10.8)

## 2021-08-22 LAB — CMP14+EGFR
ALT: 8 IU/L (ref 0–32)
AST: 16 IU/L (ref 0–40)
Albumin/Globulin Ratio: 1.5 (ref 1.2–2.2)
Albumin: 4 g/dL (ref 3.7–4.7)
Alkaline Phosphatase: 83 IU/L (ref 44–121)
BUN/Creatinine Ratio: 14 (ref 12–28)
BUN: 11 mg/dL (ref 8–27)
Bilirubin Total: 0.6 mg/dL (ref 0.0–1.2)
CO2: 28 mmol/L (ref 20–29)
Calcium: 9.1 mg/dL (ref 8.7–10.3)
Chloride: 96 mmol/L (ref 96–106)
Creatinine, Ser: 0.78 mg/dL (ref 0.57–1.00)
Globulin, Total: 2.7 g/dL (ref 1.5–4.5)
Glucose: 103 mg/dL — ABNORMAL HIGH (ref 70–99)
Potassium: 4.5 mmol/L (ref 3.5–5.2)
Sodium: 137 mmol/L (ref 134–144)
Total Protein: 6.7 g/dL (ref 6.0–8.5)
eGFR: 81 mL/min/{1.73_m2} (ref >59)

## 2021-08-22 LAB — VITAMIN B12: Vitamin B-12: 754 pg/mL (ref 232–1245)

## 2021-08-23 ENCOUNTER — Telehealth: Payer: Self-pay

## 2021-08-23 NOTE — Telephone Encounter (Signed)
Received a PA for Nexlizet 180-10mg  tablets   PA not required for - Ezetimibe  Please advise if you can change or PA needs to be started.   Key- [id number]

## 2021-08-23 NOTE — Telephone Encounter (Signed)
Needs to be done. Patient was uncontrolled on Zetia previously. She has been on this medication; not a new start. Started by Almyra Free if that helps.

## 2021-08-30 NOTE — Telephone Encounter (Signed)
Key: [id number] - PA Case ID: [id number] - Rx #: [id number]  Sent to plan

## 2021-09-03 NOTE — Telephone Encounter (Signed)
Nexlizet approved through 11/02/2022  [place] the approval letter

## 2021-09-09 DIAGNOSIS — J449 Chronic obstructive pulmonary disease, unspecified: Secondary | ICD-10-CM | POA: Diagnosis not present

## 2021-09-16 ENCOUNTER — Ambulatory Visit: Payer: Medicare HMO

## 2021-09-30 ENCOUNTER — Other Ambulatory Visit: Payer: Self-pay | Admitting: Family Medicine

## 2021-09-30 DIAGNOSIS — D123 Benign neoplasm of transverse colon: Secondary | ICD-10-CM | POA: Diagnosis not present

## 2021-10-09 DIAGNOSIS — J449 Chronic obstructive pulmonary disease, unspecified: Secondary | ICD-10-CM | POA: Diagnosis not present

## 2021-10-14 ENCOUNTER — Other Ambulatory Visit: Payer: Self-pay | Admitting: Nurse Practitioner

## 2021-10-24 DIAGNOSIS — E785 Hyperlipidemia, unspecified: Secondary | ICD-10-CM | POA: Diagnosis not present

## 2021-10-24 DIAGNOSIS — Z23 Encounter for immunization: Secondary | ICD-10-CM | POA: Diagnosis not present

## 2021-10-24 DIAGNOSIS — J449 Chronic obstructive pulmonary disease, unspecified: Secondary | ICD-10-CM | POA: Diagnosis not present

## 2021-10-24 DIAGNOSIS — I10 Essential (primary) hypertension: Secondary | ICD-10-CM | POA: Diagnosis not present

## 2021-10-24 DIAGNOSIS — K219 Gastro-esophageal reflux disease without esophagitis: Secondary | ICD-10-CM | POA: Diagnosis not present

## 2021-10-24 DIAGNOSIS — E559 Vitamin D deficiency, unspecified: Secondary | ICD-10-CM | POA: Diagnosis not present

## 2021-10-24 DIAGNOSIS — E538 Deficiency of other specified B group vitamins: Secondary | ICD-10-CM | POA: Diagnosis not present

## 2021-10-24 DIAGNOSIS — J309 Allergic rhinitis, unspecified: Secondary | ICD-10-CM | POA: Diagnosis not present

## 2021-10-24 DIAGNOSIS — N3281 Overactive bladder: Secondary | ICD-10-CM | POA: Diagnosis not present

## 2021-10-24 DIAGNOSIS — I4819 Other persistent atrial fibrillation: Secondary | ICD-10-CM | POA: Diagnosis not present

## 2021-10-24 DIAGNOSIS — Z789 Other specified health status: Secondary | ICD-10-CM | POA: Diagnosis not present

## 2021-11-09 DIAGNOSIS — J449 Chronic obstructive pulmonary disease, unspecified: Secondary | ICD-10-CM | POA: Diagnosis not present

## 2021-11-14 ENCOUNTER — Other Ambulatory Visit: Payer: Self-pay | Admitting: Family Medicine

## 2021-12-09 DIAGNOSIS — Z1231 Encounter for screening mammogram for malignant neoplasm of breast: Secondary | ICD-10-CM | POA: Diagnosis not present

## 2021-12-09 DIAGNOSIS — Z780 Asymptomatic menopausal state: Secondary | ICD-10-CM | POA: Diagnosis not present

## 2021-12-09 NOTE — Progress Notes (Signed)
PCP:  Loman Brooklyn, FNP Primary Cardiologist: None Electrophysiologist: Thompson Grayer, MD   [name] is a 71 y.o. female seen today for Thompson Grayer, MD for routine electrophysiology followup.  Since last being seen in our clinic the patient reports doing very well overall. She has a large colon polyp that she is having removed at [facility] at the end of the month. She did have one episode of palpitations associated with a GERD attack, but otherwise is quiescent.  she denies chest pain, dyspnea, PND, orthopnea, nausea, vomiting, dizziness, syncope, edema, weight gain, or early satiety.  Past Medical History:  Diagnosis Date   Colon polyps    COPD (chronic obstructive pulmonary disease) (HCC)    GERD (gastroesophageal reflux disease)    Hypercholesteremia    Mild mitral regurgitation    Mitral valve prolapse    a. dx 1985, not seen on echoes more recently.   Obesity    Paroxysmal atrial fibrillation (HCC)    Paroxysmal atrial flutter ([place])    a. dx 05/2017.   Past Surgical History:  Procedure Laterality Date   ABDOMINAL HYSTERECTOMY     ATRIAL FIBRILLATION ABLATION N/A 05/04/2018   Procedure: ATRIAL FIBRILLATION ABLATION;  Surgeon: Thompson Grayer, MD;  Location: [place] [facility];  Service: Cardiovascular;  Laterality: N/A;   BIOPSY  05/30/2021   Procedure: BIOPSY;  Surgeon: Lavena Bullion, DO;  Location: [facility];  Service: Gastroenterology;;   CARDIOVERSION N/A 10/08/2017   Procedure: CARDIOVERSION;  Surgeon: Herminio Commons, MD;  [facility];  Service: Cardiovascular;  Laterality: N/A;   CESAREAN SECTION     COLONOSCOPY WITH PROPOFOL N/A 05/30/2021   Procedure: COLONOSCOPY WITH PROPOFOL;  Surgeon: Lavena Bullion, DO;  Location: [facility];  Service: Gastroenterology;  Laterality: N/A;   HEMOSTASIS CLIP PLACEMENT  05/30/2021   Procedure: HEMOSTASIS CLIP PLACEMENT;  Surgeon: Lavena Bullion, DO;  [facility]: [facility];  Service:  Gastroenterology;;   POLYPECTOMY  05/30/2021   Procedure: POLYPECTOMY;  Surgeon: Lavena Bullion, DO;  [facility];  Service: Gastroenterology;;   SUBMUCOSAL TATTOO INJECTION  05/30/2021   Procedure: SUBMUCOSAL TATTOO INJECTION;  Surgeon: Lavena Bullion, DO;  Location: [facility];  Service: Gastroenterology;;   TEE WITHOUT CARDIOVERSION N/A 05/03/2018   Procedure: TRANSESOPHAGEAL ECHOCARDIOGRAM (TEE);  Surgeon: Fay Records, MD;  Location: [facility] END[facility];  Service: Cardiovascular;  Laterality: N/A;   TUBAL LIGATION      Current Outpatient Medications  Medication Sig Dispense Refill   albuterol (PROAIR HFA) 108 (90 Base) MCG/ACT inhaler Inhale 2 puffs into the lungs every 6 (six) hours as needed for wheezing or shortness of breath. 1 Inhaler 11   albuterol (PROVENTIL) (2.5 MG/3ML) 0.083% nebulizer solution Take 3 mLs (2.5 mg total) by nebulization every 6 (six) hours as needed for wheezing or shortness of breath. 240 mL 1   apixaban (ELIQUIS) 5 MG TABS tablet Take 1 tablet (5 mg total) by mouth 2 (two) times daily. 180 tablet 1   Bempedoic Acid-Ezetimibe (NEXLIZET) 180-10 MG TABS Take 1 tablet by mouth daily. 90 tablet 1   Cholecalciferol (VITAMIN D3) 5000 UNITS CAPS Take 5,000 Units by mouth daily.      Fluticasone-Umeclidin-Vilant (TRELEGY ELLIPTA) 100-62.5-25 MCG/ACT AEPB Inhale 1 puff into the lungs daily. 60 each 5   guaiFENesin (MUCINEX) 600 MG 12 hr tablet Take 600 mg by mouth every morning.      metoprolol tartrate (LOPRESSOR) 25 MG tablet Take 1 tablet (25 mg total)  by mouth 2 (two) times daily. 180 tablet 1   mirabegron ER (MYRBETRIQ) 25 MG TB24 tablet Take 1 tablet (25 mg total) by mouth daily. 90 tablet 1   montelukast (SINGULAIR) 10 MG tablet Take 1 tablet (10 mg total) by mouth at bedtime. 90 tablet 1   OXYGEN Inhale 2 L into the lungs continuous.     pantoprazole (PROTONIX) 40 MG tablet TAKE 1 TABLET DAILY 90 tablet 0   VASCEPA 1 g capsule TAKE (2) CAPSULES TWICE  DAILY. 120 capsule 5   Current Facility-Administered Medications  Medication Dose Route Frequency Provider Last Rate Last Admin   cyanocobalamin ((VITAMIN B-12)) injection 1,000 mcg  1,000 mcg Intramuscular Q30 days Loman Brooklyn, FNP   1,000 mcg at 08/14/21 1030    Allergies  Allergen Reactions   Penicillins Rash    Has patient had a PCN reaction causing immediate rash, facial/tongue/throat swelling, SOB or lightheadedness with hypotension: Yes Has patient had a PCN reaction causing severe rash involving mucus membranes or skin necrosis: Yes Has patient had a PCN reaction that required hospitalization: No Has patient had a PCN reaction occurring within the last 10 years: No If all of the above answers are "NO", then may proceed with Cephalosporin use.    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   Occupation: [employer]     Comment: retired  Tobacco Use   Smoking status: Former    Packs/day: 1.00    Years: 20.00    Pack years: 20.00    Types: Cigarettes    Quit date: 08/14/1990    Years since quitting: 31.3    Passive exposure: Never   Smokeless tobacco: Never  Vaping Use   Vaping Use: Never used  Substance and Sexual Activity   Alcohol use: No    Alcohol/week: 0.0 standard drinks   Drug use: No   Sexual activity: Not Currently  Other Topics Concern   Not on file  Social History Narrative   Lives home with her husband   Oxygen dependent   Involved in church - socially active   Not very physically active, but doesn't have to use assistive device to walk   Social Determinants of Health   Financial Resource Strain: Low Risk    Difficulty of Paying  Living Expenses: Not hard at all  Food Insecurity: No Food Insecurity   Worried About Charity fundraiser in the Last Year: Never true   Arboriculturist in the Last Year: Never true  Transportation Needs: No Transportation Needs   Lack of Transportation (Medical): No   Lack of Transportation (Non-Medical): No  Physical Activity: Inactive   Days of Exercise per Week: 0 days   Minutes of Exercise per Session: 0 min  Stress: No Stress Concern Present   Feeling of Stress : Not at all  Social Connections: Socially Integrated   Frequency of Communication with Friends and Family: More than three times a week   Frequency of Social Gatherings with Friends and Family: More than three times a week   Attends Religious Services: More than 4 times per year  Active Member of Clubs or Organizations: Yes   Attends Music therapist: More than 4 times per year   Marital Status: Married  Human resources officer Violence: Not At Risk   Fear of Current or Ex-Partner: No   Emotionally Abused: No   Physically Abused: No   Sexually Abused: No     Review of Systems: All other systems reviewed and are otherwise negative except as noted above.  Physical Exam: Vitals:   12/17/21 1146  BP: (!) 146/68  Pulse: 72  SpO2: 90%  Weight: 208 lb 6.4 oz (94.5 kg)  Height: 5\' 5"  (1.651 m)    GEN- The patient is well appearing, alert and oriented x 3 today.   HEENT: normocephalic, atraumatic; sclera clear, conjunctiva pink; hearing intact; oropharynx clear; neck supple, no JVP Lymph- no cervical lymphadenopathy Lungs- Clear to ausculation bilaterally, normal work of breathing.  No wheezes, rales, rhonchi Heart- Regular rate and rhythm, no murmurs, rubs or gallops, PMI not laterally displaced GI- soft, non-tender, non-distended, bowel sounds present, no hepatosplenomegaly Extremities- no clubbing, cyanosis, or edema; DP/PT/radial pulses 2+ bilaterally MS- no significant deformity or atrophy Skin- warm  and dry, no rash or lesion Psych- euthymic mood, full affect Neuro- strength and sensation are intact  EKG is ordered. Personal review of EKG from today shows NSr at 72 bpm  Additional studies reviewed include: Previous EP office notes.   Assessment and Plan:  1. Persistent Afib, and flutter S/p PVI and CTI ablation with Dr. Rayann Heman Continue Eliquis for CHA2DS2-VASc of at least 3.     EKG today shows NSR at 72 bpm   2. Diastolic dysfunction Echo 11/2018 LVEF 55-60% Volume status stable today.    3. COPD Per PCP  Uses O2 2L with exertion. She is OK at rest for "a little while" but is supposed to where chronically.   Follow up with  EPMD  in 6 months   Shirley Friar, [place]  12/17/21 12:11 PM

## 2021-12-10 DIAGNOSIS — J449 Chronic obstructive pulmonary disease, unspecified: Secondary | ICD-10-CM | POA: Diagnosis not present

## 2021-12-17 ENCOUNTER — Encounter: Payer: Self-pay | Admitting: Student

## 2021-12-17 ENCOUNTER — Other Ambulatory Visit: Payer: Self-pay

## 2021-12-17 ENCOUNTER — Ambulatory Visit: Payer: Medicare HMO | Admitting: Student

## 2021-12-17 VITALS — BP 146/68 | HR 72 | Ht 65.0 in | Wt 208.4 lb

## 2021-12-17 DIAGNOSIS — I5189 Other ill-defined heart diseases: Secondary | ICD-10-CM

## 2021-12-17 DIAGNOSIS — I10 Essential (primary) hypertension: Secondary | ICD-10-CM

## 2021-12-17 DIAGNOSIS — I4819 Other persistent atrial fibrillation: Secondary | ICD-10-CM

## 2021-12-17 NOTE — Patient Instructions (Signed)
Medication Instructions:  Your physician recommends that you continue on your current medications as directed. Please refer to the Current Medication list given to you today.  *If you need a refill on your cardiac medications before your next appointment, please call your pharmacy*   Lab Work: None If you have labs (blood work) drawn today and your tests are completely normal, you will receive your results only by: MyChart Message (if you have MyChart) OR A paper copy in the mail If you have any lab test that is abnormal or we need to change your treatment, we will call you to review the results.   Follow-Up: At [facility], you and your health needs are our priority.  As part of our continuing mission to provide you with exceptional heart care, we have created designated Provider Care Teams.  These Care Teams include your primary Cardiologist (physician) and Advanced Practice Providers (APPs -  Physician Assistants and Nurse Practitioners) who all work together to provide you with the care you need, when you need it.   Your next appointment:   6 month(s)  The format for your next appointment:   In Person  Provider:   You may see James Allred, MD or one of the following Advanced Practice Providers on your designated Care Team:   Renee Ursuy, PA-C Michael "Andy" Tillery, PA-C    

## 2021-12-30 ENCOUNTER — Encounter: Payer: Self-pay | Admitting: Family Medicine

## 2021-12-30 DIAGNOSIS — Z87891 Personal history of nicotine dependence: Secondary | ICD-10-CM | POA: Diagnosis not present

## 2021-12-30 DIAGNOSIS — J449 Chronic obstructive pulmonary disease, unspecified: Secondary | ICD-10-CM | POA: Diagnosis not present

## 2021-12-30 DIAGNOSIS — Z6835 Body mass index (BMI) 35.0-35.9, adult: Secondary | ICD-10-CM | POA: Diagnosis not present

## 2021-12-30 DIAGNOSIS — K219 Gastro-esophageal reflux disease without esophagitis: Secondary | ICD-10-CM | POA: Diagnosis not present

## 2021-12-30 DIAGNOSIS — E669 Obesity, unspecified: Secondary | ICD-10-CM | POA: Diagnosis not present

## 2021-12-30 DIAGNOSIS — D126 Benign neoplasm of colon, unspecified: Secondary | ICD-10-CM | POA: Diagnosis not present

## 2021-12-30 DIAGNOSIS — K5730 Diverticulosis of large intestine without perforation or abscess without bleeding: Secondary | ICD-10-CM | POA: Diagnosis not present

## 2021-12-30 DIAGNOSIS — I4891 Unspecified atrial fibrillation: Secondary | ICD-10-CM | POA: Diagnosis not present

## 2021-12-30 DIAGNOSIS — E7800 Pure hypercholesterolemia, unspecified: Secondary | ICD-10-CM | POA: Diagnosis not present

## 2021-12-30 DIAGNOSIS — K635 Polyp of colon: Secondary | ICD-10-CM | POA: Diagnosis not present

## 2021-12-30 DIAGNOSIS — D123 Benign neoplasm of transverse colon: Secondary | ICD-10-CM | POA: Diagnosis not present

## 2021-12-30 LAB — HM COLONOSCOPY

## 2022-01-07 DIAGNOSIS — J449 Chronic obstructive pulmonary disease, unspecified: Secondary | ICD-10-CM | POA: Diagnosis not present

## 2022-02-07 DIAGNOSIS — J449 Chronic obstructive pulmonary disease, unspecified: Secondary | ICD-10-CM | POA: Diagnosis not present

## 2022-02-11 DIAGNOSIS — R300 Dysuria: Secondary | ICD-10-CM | POA: Diagnosis not present

## 2022-02-11 DIAGNOSIS — R350 Frequency of micturition: Secondary | ICD-10-CM | POA: Diagnosis not present

## 2022-02-13 ENCOUNTER — Other Ambulatory Visit: Payer: Self-pay | Admitting: Family Medicine

## 2022-02-13 DIAGNOSIS — J449 Chronic obstructive pulmonary disease, unspecified: Secondary | ICD-10-CM

## 2022-02-20 ENCOUNTER — Other Ambulatory Visit: Payer: Self-pay | Admitting: Family Medicine

## 2022-02-20 DIAGNOSIS — I4819 Other persistent atrial fibrillation: Secondary | ICD-10-CM

## 2022-02-20 DIAGNOSIS — J449 Chronic obstructive pulmonary disease, unspecified: Secondary | ICD-10-CM

## 2022-03-09 DIAGNOSIS — J449 Chronic obstructive pulmonary disease, unspecified: Secondary | ICD-10-CM | POA: Diagnosis not present

## 2022-03-15 ENCOUNTER — Other Ambulatory Visit: Payer: Self-pay | Admitting: Family Medicine

## 2022-03-15 DIAGNOSIS — N3281 Overactive bladder: Secondary | ICD-10-CM

## 2022-03-27 ENCOUNTER — Other Ambulatory Visit: Payer: Self-pay | Admitting: Nurse Practitioner

## 2022-03-27 DIAGNOSIS — E7800 Pure hypercholesterolemia, unspecified: Secondary | ICD-10-CM

## 2022-04-09 DIAGNOSIS — J449 Chronic obstructive pulmonary disease, unspecified: Secondary | ICD-10-CM | POA: Diagnosis not present

## 2022-04-24 DIAGNOSIS — E6609 Other obesity due to excess calories: Secondary | ICD-10-CM | POA: Diagnosis not present

## 2022-04-24 DIAGNOSIS — E781 Pure hyperglyceridemia: Secondary | ICD-10-CM | POA: Diagnosis not present

## 2022-04-24 DIAGNOSIS — Z6834 Body mass index (BMI) 34.0-34.9, adult: Secondary | ICD-10-CM | POA: Diagnosis not present

## 2022-04-24 DIAGNOSIS — N3281 Overactive bladder: Secondary | ICD-10-CM | POA: Diagnosis not present

## 2022-04-24 DIAGNOSIS — K219 Gastro-esophageal reflux disease without esophagitis: Secondary | ICD-10-CM | POA: Diagnosis not present

## 2022-04-24 DIAGNOSIS — I10 Essential (primary) hypertension: Secondary | ICD-10-CM | POA: Diagnosis not present

## 2022-04-24 DIAGNOSIS — I4821 Permanent atrial fibrillation: Secondary | ICD-10-CM | POA: Diagnosis not present

## 2022-04-24 DIAGNOSIS — Z9981 Dependence on supplemental oxygen: Secondary | ICD-10-CM | POA: Diagnosis not present

## 2022-04-24 DIAGNOSIS — J449 Chronic obstructive pulmonary disease, unspecified: Secondary | ICD-10-CM | POA: Diagnosis not present

## 2022-04-25 ENCOUNTER — Other Ambulatory Visit: Payer: Self-pay | Admitting: Family Medicine

## 2022-04-25 DIAGNOSIS — E7800 Pure hypercholesterolemia, unspecified: Secondary | ICD-10-CM

## 2022-05-09 DIAGNOSIS — J449 Chronic obstructive pulmonary disease, unspecified: Secondary | ICD-10-CM | POA: Diagnosis not present

## 2022-06-09 DIAGNOSIS — J449 Chronic obstructive pulmonary disease, unspecified: Secondary | ICD-10-CM | POA: Diagnosis not present

## 2022-06-09 NOTE — Progress Notes (Signed)
PCP:  Bridget Hartshorn, NP Primary Cardiologist: None Electrophysiologist: Thompson Grayer, MD   [name] is a 71 y.o. female seen today for Thompson Grayer, MD for routine electrophysiology followup.  Since last being seen in our clinic the patient reports doing very well. Has breakthrough AF, but rarely. Nothing specific that brings it on.  she denies chest pain, palpitations, dyspnea, PND, orthopnea, nausea, vomiting, dizziness, syncope, edema, weight gain, or early satiety.  Past Medical History:  Diagnosis Date   Colon polyps    COPD (chronic obstructive pulmonary disease) (HCC)    GERD (gastroesophageal reflux disease)    Hypercholesteremia    Mild mitral regurgitation    Mitral valve prolapse    a. dx 1985, not seen on echoes more recently.   Obesity    Paroxysmal atrial fibrillation (HCC)    Paroxysmal atrial flutter ([place])    a. dx 05/2017.   Past Surgical History:  Procedure Laterality Date   ABDOMINAL HYSTERECTOMY     ATRIAL FIBRILLATION ABLATION N/A 05/04/2018   Procedure: ATRIAL FIBRILLATION ABLATION;  Surgeon: Thompson Grayer, MD;  Location: [place] [facility];  Service: Cardiovascular;  Laterality: N/A;   BIOPSY  05/30/2021   Procedure: BIOPSY;  Surgeon: Lavena Bullion, DO;  [facility];  Service: Gastroenterology;;   CARDIOVERSION N/A 10/08/2017   Procedure: CARDIOVERSION;  Surgeon: Herminio Commons, MD;  [facility];  Service: Cardiovascular;  Laterality: N/A;   CESAREAN SECTION     COLONOSCOPY WITH PROPOFOL N/A 05/30/2021   Procedure: COLONOSCOPY WITH PROPOFOL;  Surgeon: Lavena Bullion, DO;  Location: [facility];  Service: Gastroenterology;  Laterality: N/A;   HEMOSTASIS CLIP PLACEMENT  05/30/2021   Procedure: HEMOSTASIS CLIP PLACEMENT;  Surgeon: Lavena Bullion, DO;  [facility];  Service: Gastroenterology;;   POLYPECTOMY  05/30/2021   Procedure: POLYPECTOMY;  Surgeon: Lavena Bullion, DO;  [facility];  Service: Gastroenterology;;   SUBMUCOSAL TATTOO INJECTION  05/30/2021   Procedure: SUBMUCOSAL TATTOO INJECTION;  Surgeon: Lavena Bullion, DO;  [facility];  Service: Gastroenterology;;   TEE WITHOUT CARDIOVERSION N/A 05/03/2018   Procedure: TRANSESOPHAGEAL ECHOCARDIOGRAM (TEE);  Surgeon: Fay Records, MD;  Location: [facility] ENDOSCOPY;  Service: Cardiovascular;  Laterality: N/A;   TUBAL LIGATION      Current Outpatient Medications  Medication Sig Dispense Refill   albuterol (PROAIR HFA) 108 (90 Base) MCG/ACT inhaler Inhale 2 puffs into the lungs every 6 (six) hours as needed for wheezing or shortness of breath. 1 Inhaler 11   albuterol (PROVENTIL) (2.5 MG/3ML) 0.083% nebulizer solution Take 3 mLs (2.5 mg total) by nebulization every 6 (six) hours as needed for wheezing or shortness of breath. 240 mL 1   apixaban (ELIQUIS) 5 MG TABS tablet Take 1 tablet (5 mg total) by mouth 2 (two) times daily. 180 tablet 1   Bempedoic Acid-Ezetimibe (NEXLIZET) 180-10 MG TABS Take 1 tablet by mouth daily. 90 tablet 1   Cholecalciferol (VITAMIN D3) 5000 UNITS CAPS Take 5,000 Units by mouth daily.      cyanocobalamin (VITAMIN B12) 1000 MCG tablet Take 1,000 mcg by mouth daily.     Fluticasone-Umeclidin-Vilant (TRELEGY ELLIPTA) 100-62.5-25 MCG/ACT AEPB Inhale 1 puff into the lungs daily. 60 each 5   guaiFENesin (MUCINEX) 600 MG 12 hr tablet Take 600 mg by mouth every morning.      metoprolol tartrate (LOPRESSOR) 25 MG tablet Take 1 tablet (25 mg total) by mouth 2 (two) times daily. 180 tablet  1   mirabegron ER (MYRBETRIQ) 25 MG TB24 tablet Take 1 tablet (25 mg total) by mouth daily. 90 tablet 1   montelukast (SINGULAIR) 10 MG tablet Take 1 tablet (10 mg total) by mouth at bedtime. 90 tablet 1   OXYGEN Inhale 2 L into the lungs continuous.     pantoprazole (PROTONIX) 40 MG tablet TAKE 1 TABLET DAILY 90 tablet 0   VASCEPA 1 g capsule TAKE (2) CAPSULES TWICE DAILY. 120 capsule 5   Current  Facility-Administered Medications  Medication Dose Route Frequency Provider Last Rate Last Admin   cyanocobalamin ((VITAMIN B-12)) injection 1,000 mcg  1,000 mcg Intramuscular Q30 days Loman Brooklyn, FNP   1,000 mcg at 08/14/21 1030    Allergies  Allergen Reactions   Penicillins Rash    Has patient had a PCN reaction causing immediate rash, facial/tongue/throat swelling, SOB or lightheadedness with hypotension: Yes Has patient had a PCN reaction causing severe rash involving mucus membranes or skin necrosis: Yes Has patient had a PCN reaction that required hospitalization: No Has patient had a PCN reaction occurring within the last 10 years: No If all of the above answers are "NO", then may proceed with Cephalosporin use.    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   Occupation: [employer]     Comment: retired  Tobacco Use   Smoking status: Former    Packs/day: 1.00    Years: 20.00    Total pack years: 20.00    Types: Cigarettes    Quit date: 08/14/1990    Years since quitting: 31.8    Passive exposure: Never   Smokeless tobacco: Never  Vaping Use   Vaping Use: Never used  Substance and Sexual Activity   Alcohol use: No    Alcohol/week: 0.0 standard drinks of alcohol   Drug use: No   Sexual activity: Not Currently  Other Topics Concern   Not on file  Social History Narrative   Lives home with her husband   Oxygen dependent   Involved in church - socially active   Not very physically active, but doesn't have to use assistive device to walk   Social Determinants of Health   Financial Resource Strain: Low Risk  (06/21/2021)   Overall Financial Resource  Strain (CARDIA)    Difficulty of Paying Living Expenses: Not hard at all  Food Insecurity: No Food Insecurity (06/21/2021)   Hunger Vital Sign    Worried About Running Out of Food in the Last Year: Never true    [place] in the Last Year: Never true  Transportation Needs: No Transportation Needs (06/21/2021)   PRAPARE - Hydrologist (Medical): No    Lack of Transportation (Non-Medical): No  Physical Activity: Inactive (06/21/2021)   Exercise Vital Sign    Days of Exercise per Week: 0 days    Minutes of Exercise per Session: 0 min  Stress: No Stress Concern Present (06/21/2021)   [place]    Feeling of Stress : Not at all  Social Connections: [place] (06/21/2021)  Social Licensed conveyancer [NHANES]    Frequency of Communication with Friends and Family: More than three times a week    Frequency of Social Gatherings with Friends and Family: More than three times a week    Attends Religious Services: More than 4 times per year    Active Member of [employer] or Organizations: Yes    Attends Music therapist: More than 4 times per year    Marital Status: Married  Human resources officer Violence: Not At Risk (06/21/2021)   Humiliation, Afraid, Rape, and Kick questionnaire    Fear of Current or Ex-Partner: No    Emotionally Abused: No    Physically Abused: No    Sexually Abused: No     Review of Systems: All other systems reviewed and are otherwise negative except as noted above.  Physical Exam: Vitals:   06/16/22 0951  BP: 100/64  Pulse: 74  SpO2: 95%  Weight: 208 lb (94.3 kg)  Height: '5\' 6"'$  (1.676 m)    GEN- The patient is well appearing, alert and oriented x 3 today.   HEENT: normocephalic, atraumatic; sclera clear, conjunctiva pink; hearing intact; oropharynx clear; neck supple, no JVP Lymph- no cervical lymphadenopathy Lungs- Clear to ausculation  bilaterally, normal work of breathing.  No wheezes, rales, rhonchi Heart- Regular rate and rhythm, no murmurs, rubs or gallops, PMI not laterally displaced GI- soft, non-tender, non-distended, bowel sounds present, no hepatosplenomegaly Extremities- no clubbing, cyanosis, or edema; DP/PT/radial pulses 2+ bilaterally MS- no significant deformity or atrophy Skin- warm and dry, no rash or lesion Psych- euthymic mood, full affect Neuro- strength and sensation are intact  EKG is ordered. Personal review of EKG from today shows NSR at 75 bpm  Additional studies reviewed include: Previous EP office notes.   Assessment and Plan:  1. Persistent Afib, and flutter S/p PVI and CTI ablation with Dr. Rayann Heman Continue Eliquis for CHA2DS2-VASc of at least 3.     EKG today shows NSR at 75 bpm Rare breakthrough, but currently she is satisfied with her control.   2. Diastolic dysfunction Echo 11/2018 LVEF 55-60% Volume status stable on exam.  Labs stable 04/24/2022   3. COPD Per PCP  Uses O2 2L chronically  Follow up with  Dr. Myles Gip  in 6 months to establish from Dr. Thomasene Mohair, PA-C  06/16/22 9:59 AM

## 2022-06-16 ENCOUNTER — Encounter: Payer: Self-pay | Admitting: Student

## 2022-06-16 ENCOUNTER — Ambulatory Visit: Payer: Medicare HMO | Admitting: Student

## 2022-06-16 VITALS — BP 100/64 | HR 74 | Ht 66.0 in | Wt 208.0 lb

## 2022-06-16 DIAGNOSIS — I10 Essential (primary) hypertension: Secondary | ICD-10-CM | POA: Diagnosis not present

## 2022-06-16 DIAGNOSIS — I4819 Other persistent atrial fibrillation: Secondary | ICD-10-CM | POA: Diagnosis not present

## 2022-06-16 DIAGNOSIS — I5189 Other ill-defined heart diseases: Secondary | ICD-10-CM | POA: Diagnosis not present

## 2022-06-16 NOTE — Patient Instructions (Addendum)
Medication Instructions:  Your physician recommends that you continue on your current medications as directed. Please refer to the Current Medication list given to you today.  *If you need a refill on your cardiac medications before your next appointment, please call your pharmacy*   Lab Work: None If you have labs (blood work) drawn today and your tests are completely normal, you will receive your results only by: [place] (if you have MyChart) OR A paper copy in the mail If you have any lab test that is abnormal or we need to change your treatment, we will call you to review the results.   Follow-Up: At [facility], you and your health needs are our priority.  As part of our continuing mission to provide you with exceptional heart care, we have created designated Provider Care Teams.  These Care Teams include your primary Cardiologist (physician) and Advanced Practice Providers (APPs -  Physician Assistants and Nurse Practitioners) who all work together to provide you with the care you need, when you need it.   Your next appointment:   6 month(s)  The format for your next appointment:   In Person  Provider:   Dr. Myles Gip

## 2022-06-24 ENCOUNTER — Ambulatory Visit: Payer: Medicare HMO

## 2022-06-30 DIAGNOSIS — S39012A Strain of muscle, fascia and tendon of lower back, initial encounter: Secondary | ICD-10-CM | POA: Diagnosis not present

## 2022-07-01 DIAGNOSIS — D123 Benign neoplasm of transverse colon: Secondary | ICD-10-CM | POA: Diagnosis not present

## 2022-07-01 DIAGNOSIS — J449 Chronic obstructive pulmonary disease, unspecified: Secondary | ICD-10-CM | POA: Diagnosis not present

## 2022-07-01 DIAGNOSIS — I480 Paroxysmal atrial fibrillation: Secondary | ICD-10-CM | POA: Diagnosis not present

## 2022-07-01 DIAGNOSIS — Z7901 Long term (current) use of anticoagulants: Secondary | ICD-10-CM | POA: Diagnosis not present

## 2022-07-01 DIAGNOSIS — I059 Rheumatic mitral valve disease, unspecified: Secondary | ICD-10-CM | POA: Diagnosis not present

## 2022-07-01 DIAGNOSIS — Z9981 Dependence on supplemental oxygen: Secondary | ICD-10-CM | POA: Diagnosis not present

## 2022-07-01 DIAGNOSIS — Z6833 Body mass index (BMI) 33.0-33.9, adult: Secondary | ICD-10-CM | POA: Diagnosis not present

## 2022-07-01 DIAGNOSIS — Z01818 Encounter for other preprocedural examination: Secondary | ICD-10-CM | POA: Diagnosis not present

## 2022-07-01 DIAGNOSIS — K635 Polyp of colon: Secondary | ICD-10-CM | POA: Diagnosis not present

## 2022-11-10 ENCOUNTER — Other Ambulatory Visit (HOSPITAL_COMMUNITY): Payer: Self-pay

## 2022-11-17 ENCOUNTER — Telehealth: Payer: Self-pay

## 2022-11-17 ENCOUNTER — Other Ambulatory Visit (HOSPITAL_COMMUNITY): Payer: Self-pay

## 2022-11-17 NOTE — Telephone Encounter (Signed)
Pharmacy Patient Advocate Encounter   Received notification from [facility] that prior authorization for Nexlizet 180-'10mg'$  tabs is required/requested.  Per Test Claim: refill too soon   PA already on file starting 11/04/2019 and ending 11/03/2023.   Key [id number]

## 2022-11-17 NOTE — Telephone Encounter (Signed)
Left message informing pt.

## 2023-01-22 ENCOUNTER — Encounter: Payer: Self-pay | Admitting: Cardiovascular Disease

## 2023-01-22 ENCOUNTER — Ambulatory Visit (INDEPENDENT_AMBULATORY_CARE_PROVIDER_SITE_OTHER): Payer: Medicare HMO

## 2023-01-22 ENCOUNTER — Ambulatory Visit: Payer: Medicare HMO | Attending: Cardiovascular Disease | Admitting: Cardiovascular Disease

## 2023-01-22 VITALS — BP 112/62 | HR 80 | Ht 66.0 in | Wt 203.8 lb

## 2023-01-22 DIAGNOSIS — I4819 Other persistent atrial fibrillation: Secondary | ICD-10-CM

## 2023-01-22 NOTE — Patient Instructions (Signed)
Medication Instructions:  Your physician recommends that you continue on your current medications as directed. Please refer to the Current Medication list given to you today.  *If you need a refill on your cardiac medications before your next appointment, please call your pharmacy*  Lab Work: None  Testing/Procedures: Your physician has requested that you wear a Zio heart monitor for 7 days. This will be mailed to your home with instructions on how to apply the monitor and how to return it when finished. Please allow 2 weeks after returning the heart monitor before our office calls you with the results.   Follow-Up: At [facility], you and your health needs are our priority.  As part of our continuing mission to provide you with exceptional heart care, we have created designated Provider Care Teams.  These Care Teams include your primary Cardiologist (physician) and Advanced Practice Providers (APPs -  Physician Assistants and Nurse Practitioners) who all work together to provide you with the care you need, when you need it.  Your next appointment:   6 week(s)  Provider:   You may see Melida Quitter, MD or one of the following Advanced Practice Providers on your designated Care Team:   Tommye Standard, [place] Legrand Como "Jonni Sanger" [place], [place] Mamie Levers, NP  Other Instructions [place] Monitor Instructions     Your physician has requested you wear a ZIO patch monitor for 7 days.  This is a single patch monitor. [employer] supplies one patch monitor per enrollment. Additional  stickers are not available. Please do not apply patch if you will be having a Nuclear Stress Test,  Echocardiogram, Cardiac CT, MRI, or Chest Xray during the period you would be wearing the  monitor. The patch cannot be worn during these tests. You cannot remove and re-apply the  ZIO XT patch monitor.  Your ZIO patch monitor will be mailed 3 day USPS to your address on file. It may take 3-5 days  to  receive your monitor after you have been enrolled.  Once you have received your monitor, please review the enclosed instructions. Your monitor  has already been registered assigning a specific monitor serial # to you.     Billing and Patient Assistance Program Information     We have supplied [employer] with any of your insurance information on file for billing purposes.  [employer] offers a sliding scale Patient Assistance Program for patients that do not have  insurance, or whose insurance does not completely cover the cost of the ZIO monitor.  You must apply for the Patient Assistance Program to qualify for this discounted rate.  To apply, please call [employer] at [phone], select option 4, select option 2, ask to apply for  Patient Assistance Program. Theodore Demark will ask your household income, and how many people  are in your household. They will quote your out-of-pocket cost based on that information.  [employer] will also be able to set up a 21-month, interest-free payment plan if needed.     Applying the monitor     Shave hair from upper left chest.  Hold abrader disc by orange tab. Rub abrader in 40 strokes over the upper left chest as  indicated in your monitor instructions.  Clean area with 4 enclosed alcohol pads. Let dry.  Apply patch as indicated in monitor instructions. Patch will be placed under collarbone on left  side of chest with arrow pointing upward.  Rub patch adhesive wings for 2 minutes. Remove white label marked "  1". Remove the white  label marked "2". Rub patch adhesive wings for 2 additional minutes.  While looking in a mirror, press and release button in center of patch. A small green light will  flash 3-4 times. This will be your only indicator that the monitor has been turned on.  Do not shower for the first 24 hours. You may shower after the first 24 hours.  Press the button if you feel a symptom. You will hear a small click. Record Date, Time and  Symptom in the  Patient Logbook.  When you are ready to remove the patch, follow instructions on the last 2 pages of Patient  Logbook. Stick patch monitor onto the last page of Patient Logbook.  Place Patient Logbook in the blue and white box. Use locking tab on box and tape box closed  securely. The blue and white box has prepaid postage on it. Please place it in the mailbox as  soon as possible. Your physician should have your test results approximately 7 days after the  monitor has been mailed back to [facility].  Call [place] at [phone] if you have questions regarding  your ZIO XT patch monitor. Call them immediately if you see an orange light blinking on your  monitor.  If your monitor falls off in less than 4 days, contact our Monitor department at [phone].  If your monitor becomes loose or falls off after 4 days call [employer] at [phone] for  suggestions on securing your monitor.

## 2023-01-22 NOTE — Progress Notes (Unsigned)
Enrolled for [employer] to mail a ZIO XT long term holter monitor to the patients address on file.  

## 2023-01-22 NOTE — Progress Notes (Signed)
PCP:  Bridget Hartshorn, NP Primary Cardiologist: None Electrophysiologist: Thompson Grayer, MD (Inactive)   TEEYA Orozco is a 72 y.o. female seen today for Thompson Grayer, MD (Inactive) for routine electrophysiology followup.  Since last being seen in our clinic the patient reports doing very well. Has breakthrough AF, but rarely. Nothing specific that brings it on.  she denies chest pain, dyspnea, PND, orthopnea, nausea, vomiting, dizziness, syncope, edema, weight gain, or early satiety.  Today she notes frequent palpitations, particular in the evenings.  She thinks that is recurrence of atrial fibrillation.   Past Medical History:  Diagnosis Date   Colon polyps    COPD (chronic obstructive pulmonary disease) (HCC)    GERD (gastroesophageal reflux disease)    Hypercholesteremia    Mild mitral regurgitation    Mitral valve prolapse    a. dx 1985, not seen on echoes more recently.   Obesity    Paroxysmal atrial fibrillation (HCC)    Paroxysmal atrial flutter ([place])    a. dx 05/2017.   Past Surgical History:  Procedure Laterality Date   ABDOMINAL HYSTERECTOMY     ATRIAL FIBRILLATION ABLATION N/A 05/04/2018   Procedure: ATRIAL FIBRILLATION ABLATION;  Surgeon: Thompson Grayer, MD;  Location: [place] [facility];  Service: Cardiovascular;  Laterality: N/A;   BIOPSY  05/30/2021   Procedure: BIOPSY;  Surgeon: Lavena Bullion, DO;  Location: [facility];  Service: Gastroenterology;;   CARDIOVERSION N/A 10/08/2017   Procedure: CARDIOVERSION;  Surgeon: Herminio Commons, MD;  [facility];  Service: Cardiovascular;  Laterality: N/A;   CESAREAN SECTION     COLONOSCOPY WITH PROPOFOL N/A 05/30/2021   Procedure: COLONOSCOPY WITH PROPOFOL;  Surgeon: Lavena Bullion, DO;  Location: [facility];  Service: Gastroenterology;  Laterality: N/A;   HEMOSTASIS CLIP PLACEMENT  05/30/2021   Procedure: HEMOSTASIS CLIP PLACEMENT;  Surgeon: Lavena Bullion, DO;  Location: [facility];   Service: Gastroenterology;;   POLYPECTOMY  05/30/2021   Procedure: POLYPECTOMY;  Surgeon: Lavena Bullion, DO;  Location: [facility];  Service: Gastroenterology;;   SUBMUCOSAL TATTOO INJECTION  05/30/2021   Procedure: SUBMUCOSAL TATTOO INJECTION;  Surgeon: Lavena Bullion, DO;  Location: [facility];  Service: Gastroenterology;;   TEE WITHOUT CARDIOVERSION N/A 05/03/2018   Procedure: TRANSESOPHAGEAL ECHOCARDIOGRAM (TEE);  Surgeon: Fay Records, MD;  Location: [facility] ENDOSCOPY;  Service: Cardiovascular;  Laterality: N/A;   TUBAL LIGATION      Current Outpatient Medications  Medication Sig Dispense Refill   albuterol (PROAIR HFA) 108 (90 Base) MCG/ACT inhaler Inhale 2 puffs into the lungs every 6 (six) hours as needed for wheezing or shortness of breath. 1 Inhaler 11   albuterol (PROVENTIL) (2.5 MG/3ML) 0.083% nebulizer solution Take 3 mLs (2.5 mg total) by nebulization every 6 (six) hours as needed for wheezing or shortness of breath. 240 mL 1   apixaban (ELIQUIS) 5 MG TABS tablet Take 1 tablet (5 mg total) by mouth 2 (two) times daily. 180 tablet 1   Bempedoic Acid-Ezetimibe (NEXLIZET) 180-10 MG TABS Take 1 tablet by mouth daily. 90 tablet 1   Cholecalciferol (VITAMIN D3) 5000 UNITS CAPS Take 5,000 Units by mouth daily.      cyanocobalamin (VITAMIN B12) 1000 MCG tablet Take 1,000 mcg by mouth daily.     Fluticasone-Umeclidin-Vilant (TRELEGY ELLIPTA) 100-62.5-25 MCG/ACT AEPB Inhale 1 puff into the lungs daily. 60 each 5   guaiFENesin (MUCINEX) 600 MG 12 hr tablet Take 600 mg by mouth every morning.  metoprolol tartrate (LOPRESSOR) 25 MG tablet Take 1 tablet (25 mg total) by mouth 2 (two) times daily. 180 tablet 1   mirabegron ER (MYRBETRIQ) 25 MG TB24 tablet Take 1 tablet (25 mg total) by mouth daily. 90 tablet 1   montelukast (SINGULAIR) 10 MG tablet Take 1 tablet (10 mg total) by mouth at bedtime. 90 tablet 1   OXYGEN Inhale 2 L into the lungs continuous.     pantoprazole (PROTONIX) 40  MG tablet TAKE 1 TABLET DAILY 90 tablet 0   VASCEPA 1 g capsule TAKE (2) CAPSULES TWICE DAILY. 120 capsule 5   Current Facility-Administered Medications  Medication Dose Route Frequency Provider Last Rate Last Admin   cyanocobalamin ((VITAMIN B-12)) injection 1,000 mcg  1,000 mcg Intramuscular Q30 days Loman Brooklyn, FNP   1,000 mcg at 08/14/21 1030    Allergies  Allergen Reactions   Penicillins Rash    Has patient had a PCN reaction causing immediate rash, facial/tongue/throat swelling, SOB or lightheadedness with hypotension: Yes Has patient had a PCN reaction causing severe rash involving mucus membranes or skin necrosis: Yes Has patient had a PCN reaction that required hospitalization: No Has patient had a PCN reaction occurring within the last 10 years: No If all of the above answers are "NO", then may proceed with Cephalosporin use.    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   Occupation: [employer]     Comment: retired  Tobacco Use   Smoking status: Former    Packs/day: 1.00    Years: 20.00    Additional pack years: 0.00    Total pack years: 20.00    Types: Cigarettes    Quit date: 08/14/1990    Years since quitting: 32.4    Passive exposure: Never   Smokeless tobacco: Never  Vaping Use   Vaping Use: Never used  Substance and Sexual Activity   Alcohol use: No    Alcohol/week: 0.0 standard drinks of alcohol   Drug use: No   Sexual activity: Not Currently  Other Topics Concern   Not on file  Social History Narrative   Lives home with her husband   Oxygen dependent   Involved in church - socially active   Not very physically active, but doesn't  have to use assistive device to walk   Social Determinants of Health   Financial Resource Strain: Low Risk  (06/21/2021)   Overall Financial Resource Strain (CARDIA)    Difficulty of Paying Living Expenses: Not hard at all  Food Insecurity: No Food Insecurity (06/21/2021)   Hunger Vital Sign    Worried About Running Out of Food in the Last Year: Never true    [place] in the Last Year: Never true  Transportation Needs: No Transportation Needs (06/21/2021)   PRAPARE - Hydrologist (Medical): No    Lack of Transportation (Non-Medical): No  Physical Activity: Inactive (06/21/2021)   Exercise Vital Sign    Days of Exercise per Week: 0 days    Minutes of Exercise per Session: 0 min  Stress: No Stress Concern Present (06/21/2021)  [place]    Feeling of Stress : Not at all  Social Connections: Socially Integrated (06/21/2021)   Social Connection and Isolation Panel [NHANES]    Frequency of Communication with Friends and Family: More than three times a week    Frequency of Social Gatherings with Friends and Family: More than three times a week    Attends Religious Services: More than 4 times per year    Active Member of [employer] or Organizations: Yes    Attends Music therapist: More than 4 times per year    Marital Status: Married  Human resources officer Violence: Not At Risk (06/21/2021)   Humiliation, Afraid, Rape, and Kick questionnaire    Fear of Current or Ex-Partner: No    Emotionally Abused: No    Physically Abused: No    Sexually Abused: No     Review of Systems: All other systems reviewed and are otherwise negative except as noted above.  Physical Exam: Vitals:   01/22/23 1126  BP: 112/62  Pulse: 80  SpO2: 95%  Weight: 203 lb 12.8 oz (92.4 kg)  Height: 5\' 6"  (1.676 m)    Gen: Appears comfortable, well-nourished CV: irregular rhythm -- bigeminy pattern, no  dependent edema Pulm: breathing easily  EKG is ordered. Personal review of EKG from today shows NSR with PACs in Bigeminy  Additional studies reviewed include: Previous EP office notes.   Assessment and Plan:  1. Persistent Afib, and flutter S/p PVI and CTI ablation with Dr. Rayann Heman Continue Eliquis for CHA2DS2-VASc of at least 3.     EKG today shows atrial bigeminy Frequent episodes of palpitations, but is able to tell that these are A-fib episodes or frequent PACs.  Will place a 5-day Zio patch monitor today and have her follow-up in a few weeks.   2. Diastolic dysfunction Echo 11/2018 LVEF 55-60% Volume status stable on exam.  Labs stable 04/24/2022   3. COPD Per PCP  Uses O2 2L chronically   Melida Quitter, MD  01/22/23 12:00 PM

## 2023-01-22 NOTE — Addendum Note (Signed)
Addended by: Molli Barrows on: 01/22/2023 12:10 PM   Modules accepted: Orders

## 2023-01-26 DIAGNOSIS — I4819 Other persistent atrial fibrillation: Secondary | ICD-10-CM | POA: Diagnosis not present

## 2023-02-06 ENCOUNTER — Ambulatory Visit: Payer: Medicare HMO | Admitting: Cardiovascular Disease

## 2023-02-09 NOTE — Progress Notes (Unsigned)
Subjective:    Patient ID: [name], female    DOB: [birth year]    MRN: [id number]   Brief patient profile:  62 yowf quit smoking in 1991 and dx as copd 1993 here in pulmonary clinic referred back to [facility] clinic 09/05/2013  By Dr Valentina Gu PA Prudy Feeler  for eval of abn CXR  History of Present Illness  09/05/2013 1st  Pulmonary office visit/ [name] cc acute onset shaking chills, cough/ gag 08/14/13 admitted to [facility] and rx as CAP  but now  feeling 100% baseline doe on  spiriva/ advair/ singulair no 02 and some am cough > most the time not productive. rec Try stopping the pm dose of advair for two weeks then stop the am dose also and see if it makes any difference Continue spiriva one capsule daily  Only use your albuterol(proair)      10/03/2013 f/u ov/[name] re: GOLD III COPD Chief Complaint  Patient presents with   Followup with PFT and CXR    Pt states that her breathing is doing well. She has used rescue inhaler x 2 in the past wk. No new co's today.   Worse off advair so restarted - has tried symbicort in past didn't work as well but hfa quite poor (see a/p) Rec Advair 250 /50 one twice daily or dulera 200 Take 2 puffs first thing in am and then another 2 puffs about 12 hours later.  Only use your albuterol as a rescue medication    Work on inhaler technique        02/10/2023  Re-establish  ov/[place] office/[name] re: GOLD 3 COPD  maint on ***  No chief complaint on file.   Dyspnea:  *** Cough: *** Sleeping: *** SABA use: *** 02: *** Covid status: *** Lung cancer screening: ***   No obvious day to day or daytime variability or assoc excess/ purulent sputum or mucus plugs or hemoptysis or cp or chest tightness, subjective wheeze or overt sinus or hb symptoms.   *** without nocturnal  or early am exacerbation  of respiratory  c/o's or need for noct saba. Also denies any obvious fluctuation of symptoms with weather or environmental changes or other  aggravating or alleviating factors except as outlined above   No unusual exposure hx or h/o childhood pna/ asthma or knowledge of premature birth.  Current Allergies, Complete Past Medical History, Past Surgical History, Family History, and Social History were reviewed in [employer] record.  ROS  The following are not active complaints unless bolded Hoarseness, sore throat, dysphagia, dental problems, itching, sneezing,  nasal congestion or discharge of excess mucus or purulent secretions, ear ache,   fever, chills, sweats, unintended wt loss or wt gain, classically pleuritic or exertional cp,  orthopnea pnd or arm/hand swelling  or leg swelling, presyncope, palpitations, abdominal pain, anorexia, nausea, vomiting, diarrhea  or change in bowel habits or change in bladder habits, change in stools or change in urine, dysuria, hematuria,  rash, arthralgias, visual complaints, headache, numbness, weakness or ataxia or problems with walking or coordination,  change in mood or  memory.        No outpatient medications have been marked as taking for the 02/10/23 encounter (Appointment) with Nyoka Cowden, MD.   Current Facility-Administered Medications for the 02/10/23 encounter (Appointment) with Nyoka Cowden, MD  Medication   cyanocobalamin ((VITAMIN B-12)) injection 1,000 mcg  Objective:   Physical Exam  wts  02/10/2023         ***   10/03/13 213 lb (96.616 kg)  09/05/13 215 lb (97.523 kg)  08/30/13 213 lb (96.616 kg)             Assessment & Plan:

## 2023-02-10 ENCOUNTER — Ambulatory Visit (HOSPITAL_COMMUNITY)
Admission: RE | Admit: 2023-02-10 | Discharge: 2023-02-10 | Disposition: A | Discharge status: Home / Self Care | Payer: Medicare HMO | Source: Ambulatory Visit | Attending: Internal Medicine | Admitting: Internal Medicine

## 2023-02-10 ENCOUNTER — Encounter: Payer: Self-pay | Admitting: Internal Medicine

## 2023-02-10 ENCOUNTER — Ambulatory Visit: Payer: Medicare HMO | Admitting: Internal Medicine

## 2023-02-10 VITALS — BP 138/66 | HR 58 | Ht 66.0 in | Wt 201.0 lb

## 2023-02-10 DIAGNOSIS — J449 Chronic obstructive pulmonary disease, unspecified: Secondary | ICD-10-CM | POA: Diagnosis present

## 2023-02-10 DIAGNOSIS — J9611 Chronic respiratory failure with hypoxia: Secondary | ICD-10-CM | POA: Diagnosis not present

## 2023-02-10 MED ORDER — BREZTRI AEROSPHERE 160-9-4.8 MCG/ACT IN AERO: 2.0000 | INHALATION_SPRAY | Freq: Two times a day (BID) | RESPIRATORY_TRACT | 11 refills | Status: DC

## 2023-02-10 MED ORDER — PREDNISONE 10 MG PO TABS: ORAL_TABLET | ORAL | 0 refills | Status: DC

## 2023-02-10 MED ORDER — GUAIFENESIN ER 600 MG PO TB12: ORAL_TABLET | ORAL | Status: AC

## 2023-02-10 MED ORDER — BREZTRI AEROSPHERE 160-9-4.8 MCG/ACT IN AERO: 2.0000 | INHALATION_SPRAY | Freq: Two times a day (BID) | RESPIRATORY_TRACT | 0 refills | Status: DC

## 2023-02-10 NOTE — Assessment & Plan Note (Signed)
Placed on 02 in 2020  2lpm   02/10/2023 advised: Make sure you check your oxygen saturation  AT  your highest level of activity (not after you stop)   to be sure it stays over 90% and adjust  02 flow upward to maintain this level if needed but remember to turn it back to previous settings when you stop (to conserve your supply).          Each maintenance medication was reviewed in detail including emphasizing most importantly the difference between maintenance and prns and under what circumstances the prns are to be triggered using an action plan format where appropriate.  Total time for H and P, chart review, counseling, reviewing hfa/02/neb device(s) and generating customized AVS unique to this office visit / same day charting = 45 min pt not seen in > 3 y

## 2023-02-10 NOTE — Patient Instructions (Addendum)
Plan A = Automatic = Always=   Breztri Take 2 puffs first thing in am and then another 2 puffs about 12 hours later.    Work on inhaler technique:  relax and gently blow all the way out then take a nice smooth full deep breath back in, triggering the inhaler at same time you start breathing in.  Hold breath in for at least  5 seconds if you can. Blow out breztri  thru nose. Rinse and gargle with water when done.  If mouth or throat bother you at all,  try brushing teeth/gums/tongue with arm and hammer toothpaste/ make a slurry and gargle and spit out.  - remember how golfers   Plan B = Backup (to supplement plan A, not to replace it) Only use your albuterol inhaler as a rescue medication to be used if you can't catch your breath by resting or doing a relaxed purse lip breathing pattern.  - The less you use it, the better it will work when you need it. - Ok to use the inhaler up to 2 puffs  every 4 hours if you must but call for appointment if use goes up over your usual need - Don't leave home without it !!  (think of it like the spare tire for your car)   Plan C = Crisis (instead of Plan B but only if Plan B stops working) - only use your albuterol nebulizer if you first try Plan B and it fails to help > ok to use the nebulizer up to every 4 hours but if start needing it regularly call for immediate appointment   Plan D = Deltasone - ok to take if ABC not working  Prednisone 10 mg take  4 each am x 2 days,   2 each am x 2 days,  1 each am x 2 days and stop   For cough / congestion > mucinex 600 mg up to 2 every 12hours (total of 2400 mg in 24 hours)   Make sure you check your oxygen saturation  AT  your highest level of activity (not after you stop)   to be sure it stays over 90% and adjust  02 flow upward to maintain this level if needed but remember to turn it back to previous settings when you stop (to conserve your supply).    Please remember to go to the  x-ray department  @  [facility] for your tests - we will call you with the results when they are available      Please schedule a follow up office visit in 6 weeks, call sooner if needed   Add eos/ alpha one testing next ov and consider hrct for bronchiectasis w/u

## 2023-02-10 NOTE — Assessment & Plan Note (Addendum)
Quit smoking 1991  - PFT's  FEV1  1.07 (40%) ratio 46 and 13% better p B2 and DLCO 61%   -  02/10/2023  After extensive coaching inhaler device,  effectiveness =    75% > try breztri over trelegy due to cough   DDX of  difficult airways management almost all start with A and  include Adherence, Ace Inhibitors, Acid Reflux, Active Sinus Disease, Alpha 1 Antitripsin deficiency, Anxiety masquerading as Airways dz,  ABPA,  Allergy(esp in young), Aspiration (esp in elderly), Adverse effects of meds,  Active smoking or vaping, A bunch of PE's (a small clot burden can't cause this syndrome unless there is already severe underlying pulm or vascular dz with poor reserve) plus two Bs  = Bronchiectasis and Beta blocker use..and one C= CHF  Adherence is always the initial "prime suspect" and is a multilayered concern that requires a "trust but verify" approach in every patient - starting with knowing how to use medications, especially inhalers, correctly, keeping up with refills and understanding the fundamental difference between maintenance and prns vs those medications only taken for a very short course and then stopped and not refilled.  -see hfa teaching    ? Acid (or non-acid) GERD > always difficult to exclude as up to 75% of pts in some series report no assoc GI/ Heartburn symptoms> rec max (24h)  acid suppression and diet restrictions/ reviewed and instructions given in writing.   ? Allergy / last cbc Eos 0.1 / continue singuair and high dose ics but in hfa form = brezri and repeat eos next ov   ? Adverse effects of dpi > try off trelegy   ? A bunch of PEs > unlikely on DOAC  ? Alpha one AT def > send phenotype next ov  ? Bronchiectasis > ? If fluticasone a concern here ? Need to eliminate ICS altogether in this setting > consider HRCT next ov   ? BB effects > unlikely on low doses of lopressor   ? Chf/ cardiac asthma > echo reviewed:  G 2 diastolic dysfunction and mild MR but nl LA on 11/05/18  but  now in relatively rapid AF > check cxr for any decompensation   F/u in 6 weeks , call sooner if needed

## 2023-02-13 ENCOUNTER — Ambulatory Visit: Payer: Medicare HMO | Admitting: Cardiovascular Disease

## 2023-02-23 ENCOUNTER — Encounter: Payer: Self-pay | Admitting: Internal Medicine

## 2023-02-25 ENCOUNTER — Telehealth: Payer: Self-pay | Admitting: Internal Medicine

## 2023-02-25 NOTE — Telephone Encounter (Signed)
Patient sent a message thru Mychart to Dr. Sherene Sires and she would like a response regarding her medication.  Please advise and call patient to update.  CB# [phone]

## 2023-02-25 NOTE — Telephone Encounter (Signed)
Called and spoke with patient. She was following up on the MyChart message from 04/22. She stated that Dr. Sherene Sires had recently prescribed a prednisone taper for her. She stated that the prednisone really helped her breathing. She is finished with the prednisone now and currently does not have any symptoms.   She wanted to know if Dr. Sherene Sires would be willing to prescribe a daily low dose of prednisone .   Dr. Sherene Sires, can you please advise? Thanks!

## 2023-02-25 NOTE — Telephone Encounter (Signed)
Glad to hear she's doing better and pred helped but Markus Daft has prednisone in it and would only add it back if flares again while using Breztri on a regular basis and we document she's inhaling it effectively/consistently  Very few pts on breztri in the above setting end up having to take prednisone at all but we'll certainly discuss all this at f/u ov  - good luck!

## 2023-02-27 NOTE — Telephone Encounter (Signed)
Called and spoke w/ pt give her MW's message. She verbalized understanding. NFN at time of call

## 2023-03-10 ENCOUNTER — Ambulatory Visit: Payer: Medicare HMO | Attending: Cardiovascular Disease | Admitting: Cardiovascular Disease

## 2023-03-10 ENCOUNTER — Encounter: Payer: Self-pay | Admitting: Cardiovascular Disease

## 2023-03-10 VITALS — BP 134/86 | HR 84 | Ht 66.0 in | Wt 200.0 lb

## 2023-03-10 DIAGNOSIS — R9431 Abnormal electrocardiogram [ECG] [EKG]: Secondary | ICD-10-CM

## 2023-03-10 DIAGNOSIS — I480 Paroxysmal atrial fibrillation: Secondary | ICD-10-CM

## 2023-03-10 NOTE — Progress Notes (Signed)
PCP:  Rebecka Apley, NP Primary Cardiologist: None Electrophysiologist: Maurice Small, MD   [name] is a 72 y.o. female seen today for Maurice Small, MD for routine electrophysiology followup.  Since last being seen in our clinic the patient reports doing very well. Has breakthrough AF, but rarely. Nothing specific that brings it on.  she denies chest pain, dyspnea, PND, orthopnea, nausea, vomiting, dizziness, syncope, edema, weight gain, or early satiety.  Today she notes frequent palpitations, particular in the evenings.  She thinks that is recurrence of atrial fibrillation. She presents today to follow-up results from her heart monitor.  She continues to have palpitations, occasional rapid heart rates.  She has since established with pulmonologist, Dr. Sherene Sires, for Gold 3 COPD and I reviewed his note.     Past Medical History:  Diagnosis Date   Colon polyps    COPD (chronic obstructive pulmonary disease) (HCC)    GERD (gastroesophageal reflux disease)    Hypercholesteremia    Mild mitral regurgitation    Mitral valve prolapse    a. dx 1985, not seen on echoes more recently.   Obesity    Paroxysmal atrial fibrillation (HCC)    Paroxysmal atrial flutter (HCC)    a. dx 05/2017.   Past Surgical History:  Procedure Laterality Date   ABDOMINAL HYSTERECTOMY     ATRIAL FIBRILLATION ABLATION N/A 05/04/2018   Procedure: ATRIAL FIBRILLATION ABLATION;  Surgeon: Hillis Range, MD;  Location: [facility];  Service: Cardiovascular;  Laterality: N/A;   BIOPSY  05/30/2021   Procedure: BIOPSY;  Surgeon: Shellia Cleverly, DO;  Location: [facility];  Service: Gastroenterology;;   CARDIOVERSION N/A 10/08/2017   Procedure: CARDIOVERSION;  Surgeon: Laqueta Linden, MD;  Location: [facility];  Service: Cardiovascular;  Laterality: N/A;   CESAREAN SECTION     COLONOSCOPY WITH PROPOFOL N/A 05/30/2021   Procedure: COLONOSCOPY WITH PROPOFOL;  Surgeon: Shellia Cleverly, DO;  Location: [facility];  Service: Gastroenterology;  Laterality: N/A;   HEMOSTASIS CLIP PLACEMENT  05/30/2021   Procedure: HEMOSTASIS CLIP PLACEMENT;  Surgeon: Shellia Cleverly, DO;  Location: [facility];  Service: Gastroenterology;;   POLYPECTOMY  05/30/2021   Procedure: POLYPECTOMY;  Surgeon: Shellia Cleverly, DO;  Location: [facility];  Service: Gastroenterology;;   SUBMUCOSAL TATTOO INJECTION  05/30/2021   Procedure: SUBMUCOSAL TATTOO INJECTION;  Surgeon: Shellia Cleverly, DO;  Location: [facility];  Service: Gastroenterology;;   TEE WITHOUT CARDIOVERSION N/A 05/03/2018   Procedure: TRANSESOPHAGEAL ECHOCARDIOGRAM (TEE);  Surgeon: Pricilla Riffle, MD;  Location: [facility] ENDOSCOPY;  Service: Cardiovascular;  Laterality: N/A;   TUBAL LIGATION      Current Outpatient Medications  Medication Sig Dispense Refill   albuterol (PROAIR HFA) 108 (90 Base) MCG/ACT inhaler Inhale 2 puffs into the lungs every 6 (six) hours as needed for wheezing or shortness of breath. 1 Inhaler 11   albuterol (PROVENTIL) (2.5 MG/3ML) 0.083% nebulizer solution Take 3 mLs (2.5 mg total) by nebulization every 6 (six) hours as needed for wheezing or shortness of breath. 240 mL 1   apixaban (ELIQUIS) 5 MG TABS tablet Take 1 tablet (5 mg total) by mouth 2 (two) times daily. 180 tablet 1   Bempedoic Acid-Ezetimibe (NEXLIZET) 180-10 MG TABS Take 1 tablet by mouth daily. 90 tablet 1   Budeson-Glycopyrrol-Formoterol (BREZTRI AEROSPHERE) 160-9-4.8 MCG/ACT AERO Inhale 2 puffs into the lungs 2 (two) times daily. 10.7 g 11   Cholecalciferol (VITAMIN D3) 5000 UNITS CAPS Take 5,000  Units by mouth daily.      cyanocobalamin (VITAMIN B12) 1000 MCG tablet Take 1,000 mcg by mouth daily.     guaiFENesin (MUCINEX) 600 MG 12 hr tablet Up to 2 every 12 hours as needed     metoprolol tartrate (LOPRESSOR) 25 MG tablet Take 1 tablet (25 mg total) by mouth 2 (two) times daily. 180 tablet 1   mirabegron ER (MYRBETRIQ) 25 MG TB24 tablet  Take 1 tablet (25 mg total) by mouth daily. 90 tablet 1   montelukast (SINGULAIR) 10 MG tablet Take 1 tablet (10 mg total) by mouth at bedtime. 90 tablet 1   OXYGEN Inhale 2 L into the lungs continuous.     pantoprazole (PROTONIX) 40 MG tablet TAKE 1 TABLET DAILY 90 tablet 0   predniSONE (DELTASONE) 10 MG tablet Take  4 each am x 2 days,   2 each am x 2 days,  1 each am x 2 days and stop 14 tablet 0   VASCEPA 1 g capsule TAKE (2) CAPSULES TWICE DAILY. 120 capsule 5   Current Facility-Administered Medications  Medication Dose Route Frequency Provider Last Rate Last Admin   cyanocobalamin ((VITAMIN B-12)) injection 1,000 mcg  1,000 mcg Intramuscular Q30 days Gwenlyn Fudge, FNP   1,000 mcg at 08/14/21 1030    Allergies  Allergen Reactions   Penicillins Rash    Has patient had a PCN reaction causing immediate rash, facial/tongue/throat swelling, SOB or lightheadedness with hypotension: Yes Has patient had a PCN reaction causing severe rash involving mucus membranes or skin necrosis: Yes Has patient had a PCN reaction that required hospitalization: No Has patient had a PCN reaction occurring within the last 10 years: No If all of the above answers are "NO", then may proceed with Cephalosporin use.    Dilaudid [Hydromorphone Hcl] Rash   Statins Other (See Comments)    myopathy myopathy    Social History   Socioeconomic History   Marital status: Married    Spouse name: Alvis   Number of children: 1   Years of education: 12   Highest education level: 12th grade  Occupational History   Occupation: Child psychotherapist: [employer]    Comment: retired   Occupation: [employer]     Comment: retired  Tobacco Use   Smoking status: Former    Packs/day: 1.00    Years: 20.00    Additional pack years: 0.00    Total pack years: 20.00    Types: Cigarettes    Quit date: 08/14/1990    Years since quitting: 32.5    Passive exposure: Never   Smokeless tobacco: Never  Vaping Use   Vaping  Use: Never used  Substance and Sexual Activity   Alcohol use: No    Alcohol/week: 0.0 standard drinks of alcohol   Drug use: No   Sexual activity: Not Currently  Other Topics Concern   Not on file  Social History Narrative   Lives home with her husband   Oxygen dependent   Involved in church - socially active   Not very physically active, but doesn't have to use assistive device to walk   Social Determinants of Health   Financial Resource Strain: Low Risk  (06/21/2021)   Overall Financial Resource Strain (CARDIA)    Difficulty of Paying Living Expenses: Not hard at all  Food Insecurity: No Food Insecurity (06/21/2021)   Hunger Vital Sign    Worried About Running Out of Food in the Last Year: Never  true    Ran Out of Food in the Last Year: Never true  Transportation Needs: No Transportation Needs (06/21/2021)   PRAPARE - Administrator, Civil Service (Medical): No    Lack of Transportation (Non-Medical): No  Physical Activity: Inactive (06/21/2021)   Exercise Vital Sign    Days of Exercise per Week: 0 days    Minutes of Exercise per Session: 0 min  Stress: No Stress Concern Present (06/21/2021)   [employer] of Occupational Health - Occupational Stress Questionnaire    Feeling of Stress : Not at all  Social Connections: Socially Integrated (06/21/2021)   Social Connection and Isolation Panel [NHANES]    Frequency of Communication with Friends and Family: More than three times a week    Frequency of Social Gatherings with Friends and Family: More than three times a week    Attends Religious Services: More than 4 times per year    Active Member of [employer] or Organizations: Yes    Attends Engineer, structural: More than 4 times per year    Marital Status: Married  Catering manager Violence: Not At Risk (06/21/2021)   Humiliation, Afraid, Rape, and Kick questionnaire    Fear of Current or Ex-Partner: No    Emotionally Abused: No    Physically Abused: No     Sexually Abused: No     Review of Systems: All other systems reviewed and are otherwise negative except as noted above.  Physical Exam: There were no vitals filed for this visit.   Gen: Appears comfortable, well-nourished CV: irregular rhythm -- bigeminy pattern, no dependent edema Pulm: breathing easily  EKG is ordered. Personal review of EKG from today shows NSR with PACs in Bigeminy  Additional studies reviewed include: Previous EP office notes.   6 day Zio monitor 01/26/23 Sinus rhythm 57-119, avg 82 Multiple episodes of atrial arrhythmia -- atrial runs, frequent PACs, possible flutter, burden 18%. Often associated with symptoms of heart racing  Assessment and Plan:  1. Persistent Afib, and flutter S/p PVI and CTI ablation with Dr. Johney Frame Continue Eliquis for CHA2DS2-VASc of at least 3.     Continue apixaban. Having symptoms of AF. Zio shows multiple arrhythmia types -- frequent PACs, atrial tachycardia suspected flutter and fibrillation.  Antiarrhythmic drug will be the best option.  Will order coronary CT, BMP and if normal start flecainide.  Gust the risk of flecainide including drug interactions and requirement for intensive monitoring of electrocardiogram and renal function.  Explained that these precautions are necessary to prevent potentially life-threatening arrhythmias.   2. Diastolic dysfunction Echo 11/2018 LVEF 55-60% Volume status stable on exam.  Labs stable 04/24/2022   3. COPD Per PCP, Dr. 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

## 2023-03-10 NOTE — Patient Instructions (Addendum)
Medication Instructions:  Your physician recommends that you continue on your current medications as directed. Please refer to the Current Medication list given to you today. *If you need a refill on your cardiac medications before your next appointment, please call your pharmacy*   Lab Work: BMET TODAY If you have labs (blood work) drawn today and your tests are completely normal, you will receive your results only by: MyChart Message (if you have MyChart) OR A paper copy in the mail If you have any lab test that is abnormal or we need to change your treatment, we will call you to review the results.   Testing/Procedures: Coronary CT Your physician has requested that you have cardiac CT. Cardiac computed tomography (CT) is a painless test that uses an x-ray machine to take clear, detailed pictures of your heart. For further information please visit [email or web address] Please follow instruction sheet as given.   Follow-Up: At [facility], you and your health needs are our priority.  As part of our continuing mission to provide you with exceptional heart care, we have created designated Provider Care Teams.  These Care Teams include your primary Cardiologist (physician) and Advanced Practice Providers (APPs -  Physician Assistants and Nurse Practitioners) who all work together to provide you with the care you need, when you need it.  We recommend signing up for the patient portal called "MyChart".  Sign up information is provided on this After Visit Summary.  MyChart is used to connect with patients for Virtual Visits (Telemedicine).  Patients are able to view lab/test results, encounter notes, upcoming appointments, etc.  Non-urgent messages can be sent to your provider as well.   To learn more about what you can do with MyChart, go to [email or web address].    Your next appointment:   2 month(s)  Provider:   York Pellant, MD   Other Instructions   Your cardiac CT  will be scheduled at:   [facility] [address] [place], [place] [address] [phone]   Please arrive at the [facility] and Children's Entrance (Entrance C2) of [facility] 30 minutes prior to test start time. You can use the FREE valet parking offered at entrance C (encouraged to control the heart rate for the test)  Proceed to the [facility] Radiology Department (first floor) to check-in and test prep.  All radiology patients and guests should use entrance C2 at [facility], accessed from [facility], even though the hospital's physical address listed is [address]     Please follow these instructions carefully (unless otherwise directed):   On the Night Before the Test: Be sure to Drink plenty of water. Do not consume any caffeinated/decaffeinated beverages or chocolate 12 hours prior to your test. Do not take any antihistamines 12 hours prior to your test.  On the Day of the Test: Drink plenty of water until 1 hour prior to the test. Do not eat any food 1 hour prior to test. You may take your regular medications prior to the test.  Take metoprolol (Lopressor) 100mg  two hours prior to test. FEMALES- please wear underwire-free bra if available, avoid dresses & tight clothing      After the Test: Drink plenty of water. After receiving IV contrast, you may experience a mild flushed feeling. This is normal. On occasion, you may experience a mild rash up to 24 hours after the test. This is not dangerous. If this occurs, you can take Benadryl 25 mg  and increase your fluid intake. If you experience trouble breathing, this can be serious. If it is severe call 911 IMMEDIATELY. If it is mild, please call our office.  We will call to schedule your test 2-4 weeks out understanding that some insurance companies will need an authorization prior to the service being performed.   For non-scheduling related questions, please contact  the cardiac imaging nurse navigator should you have any questions/concerns: Rockwell Alexandria, Cardiac Imaging Nurse Navigator Larey Brick, Cardiac Imaging Nurse Navigator [facility] Direct Office Dial: [phone]   For scheduling needs, including cancellations and rescheduling, please call [place], [phone].

## 2023-03-11 LAB — BASIC METABOLIC PANEL
BUN/Creatinine Ratio: 17 (ref 12–28)
BUN: 15 mg/dL (ref 8–27)
CO2: 27 mmol/L (ref 20–29)
Calcium: 9.4 mg/dL (ref 8.7–10.3)
Chloride: 99 mmol/L (ref 96–106)
Creatinine, Ser: 0.9 mg/dL (ref 0.57–1.00)
Glucose: 115 mg/dL — ABNORMAL HIGH (ref 70–99)
Potassium: 4.1 mmol/L (ref 3.5–5.2)
Sodium: 141 mmol/L (ref 134–144)
eGFR: 67 mL/min/{1.73_m2} (ref >59)

## 2023-03-17 ENCOUNTER — Telehealth (HOSPITAL_COMMUNITY): Payer: Self-pay | Admitting: *Deleted

## 2023-03-17 NOTE — Telephone Encounter (Signed)
Reaching out to patient to offer assistance regarding upcoming cardiac imaging study; pt verbalizes understanding of appt date/time, parking situation and where to check in, pre-test NPO status and medications ordered, and verified current allergies; name and call back number provided for further questions should they arise  Larey Brick RN Navigator Cardiac Imaging [facility] [phone] office [phone] cell  Patient to take 100mg  metoprolol tartrate two hours prior to her cardiac CT scan. She is aware to arrive 12:30pm.

## 2023-03-18 ENCOUNTER — Ambulatory Visit (HOSPITAL_COMMUNITY)
Admission: RE | Admit: 2023-03-18 | Discharge: 2023-03-18 | Disposition: A | Discharge status: Home / Self Care | Payer: Medicare HMO | Source: Ambulatory Visit | Attending: Cardiovascular Disease | Admitting: Cardiovascular Disease

## 2023-03-18 DIAGNOSIS — I480 Paroxysmal atrial fibrillation: Secondary | ICD-10-CM | POA: Insufficient documentation

## 2023-03-18 DIAGNOSIS — I2510 Atherosclerotic heart disease of native coronary artery without angina pectoris: Secondary | ICD-10-CM

## 2023-03-18 DIAGNOSIS — R9431 Abnormal electrocardiogram [ECG] [EKG]: Secondary | ICD-10-CM | POA: Diagnosis present

## 2023-03-18 MED ORDER — NITROGLYCERIN 0.4 MG SL SUBL
SUBLINGUAL_TABLET | SUBLINGUAL | Status: AC
  Filled 2023-03-18: qty 2

## 2023-03-18 MED ORDER — NITROGLYCERIN 0.4 MG SL SUBL
0.8000 mg | SUBLINGUAL_TABLET | Freq: Once | SUBLINGUAL | Status: AC
  Administered 2023-03-18: 0.8 mg via SUBLINGUAL

## 2023-03-18 MED ORDER — IOHEXOL 350 MG/ML SOLN
95.0000 mL | Freq: Once | INTRAVENOUS | Status: AC | PRN
  Administered 2023-03-18: 95 mL via INTRAVENOUS

## 2023-03-23 ENCOUNTER — Telehealth: Payer: Self-pay | Admitting: Cardiovascular Disease

## 2023-03-23 ENCOUNTER — Other Ambulatory Visit: Payer: Self-pay

## 2023-03-23 MED ORDER — FLECAINIDE ACETATE 50 MG PO TABS: 50.0000 mg | ORAL_TABLET | Freq: Two times a day (BID) | ORAL | 3 refills | Status: DC

## 2023-03-23 NOTE — Telephone Encounter (Signed)
Spoke with patient about test/lab results. Flecainide 50mg  bid sent in to pharmacy. Nurse visit for EKG scheduled for 03/31/23 at 2pm. No further needs at this time     Maurice Small, MD 03/18/2023  8:35 PM EDT     CT shows normal coronary arteries. Labs also normal. She may start flecainide 50mg  PO BID.

## 2023-03-23 NOTE — Telephone Encounter (Signed)
Pt is calling in asking when is she supposed to start Flecinide from CT results. Please advise.

## 2023-03-24 NOTE — Progress Notes (Unsigned)
Subjective:   Patient ID: [name], female    DOB: [birth year]    MRN: [id number]  Brief patient profile:  72 yowf quit smoking in 1991 and dx as copd 1993  in [facility] pulmonary clinic referred back to [facility] clinic 09/05/2013  By Dr Valentina Gu PA Prudy Feeler  for eval of abn CXR  History of Present Illness  09/05/2013 1st [facility] office visit/ Ferdinando Lodge cc acute onset shaking chills, cough/ gag 08/14/13 admitted to [facility] and rx as CAP  but now  feeling 100% baseline doe on  spiriva/ advair/ singulair no 02 and some am cough > most the time not productive. rec Try stopping the pm dose of advair for two weeks then stop the am dose also and see if it makes any difference Continue spiriva one capsule daily  Only use your albuterol(proair)      10/03/2013 f/u ov/[name] re: GOLD III COPD Chief Complaint  Patient presents with   Followup with PFT and CXR    Pt states that her breathing is doing well. She has used rescue inhaler x 2 in the past wk. No new co's today.   Worse off advair so restarted - has tried symbicort in past didn't work as well but hfa quite poor (see a/p) Rec Advair 250 /50 one twice daily or dulera 200 Take 2 puffs first thing in am and then another 2 puffs about 12 hours later.  Only use your albuterol as a rescue medication    Work on inhaler technique        02/10/2023  Re-establish  ov/[place] office/[name] re: GOLD 3 COPD  maint on trelegy 100  / 02 dep since 2020 never had covid  Chief Complaint  Patient presents with   Pulmonary Consult    Referred by Sharon Seller, NP. Pt c/o increased DOE for the past 6 months, esp worse x 2 months.   Dyspnea:  limited by weak legs  and knees hurt so very inactive mostly housebound rides scooter when shops  Cough: still smoker's rattle esp in am  white mucus / sometimes severe cough / heaves gag sev times a week  Sleeping: flat bed / L side/ one pillow s resp cc  SABA use: inhaler once a day, neb 3-4 x times a  week  02: 2lpm POC when out with sats in the 80s sometimes but does not titrate Rec Plan A = Automatic = Always=   Breztri Take 2 puffs first thing in am and then another 2 puffs about 12 hours later.   Work on inhaler technique:  Plan B = Backup (to supplement plan A, not to replace it) Only use your albuterol inhaler as a rescue medication  Plan C = Crisis (instead of Plan B but only if Plan B stops working) - only use your albuterol nebulizer if you first try Plan B  Plan D = Deltasone - ok to take if ABC not working  Prednisone 10 mg take  4 each am x 2 days,   2 each am x 2 days,  1 each am x 2 days and stop  For cough / congestion > mucinex 600 mg up to 2 every 12hours (total of 2400 mg in 24 hours)  Make sure you check your oxygen saturation  AT  your highest level of activity (not after you stop)   to be sure it stays over 90%       03/26/2023  f/u ov/[place] office/[name] re: ***  maint on *** eos/ alpha one testing / hrct for bronchiectasis w/u *** No chief complaint on file.   Dyspnea:  *** Cough: *** Sleeping: *** SABA use: *** 02: *** Covid status: *** Lung cancer screening: ***   No obvious day to day or daytime variability or assoc excess/ purulent sputum or mucus plugs or hemoptysis or cp or chest tightness, subjective wheeze or overt sinus or hb symptoms.   *** without nocturnal  or early am exacerbation  of respiratory  c/o's or need for noct saba. Also denies any obvious fluctuation of symptoms with weather or environmental changes or other aggravating or alleviating factors except as outlined above   No unusual exposure hx or h/o childhood pna/ asthma or knowledge of premature birth.  Current Allergies, Complete Past Medical History, Past Surgical History, Family History, and Social History were reviewed in [employer] record.  ROS  The following are not active complaints unless bolded Hoarseness, sore throat, dysphagia, dental problems,  itching, sneezing,  nasal congestion or discharge of excess mucus or purulent secretions, ear ache,   fever, chills, sweats, unintended wt loss or wt gain, classically pleuritic or exertional cp,  orthopnea pnd or arm/hand swelling  or leg swelling, presyncope, palpitations, abdominal pain, anorexia, nausea, vomiting, diarrhea  or change in bowel habits or change in bladder habits, change in stools or change in urine, dysuria, hematuria,  rash, arthralgias, visual complaints, headache, numbness, weakness or ataxia or problems with walking or coordination,  change in mood or  memory.        No outpatient medications have been marked as taking for the 03/26/23 encounter (Appointment) with Nyoka Cowden, MD.   Current Facility-Administered Medications for the 03/26/23 encounter (Appointment) with Nyoka Cowden, MD  Medication   cyanocobalamin ((VITAMIN B-12)) injection 1,000 mcg                 Objective:   Physical Exam  wts  03/26/2023       ***  02/10/2023         201   10/03/13 213 lb (96.616 kg)  09/05/13 215 lb (97.523 kg)  08/30/13 213 lb (96.616 kg)       Vital signs reviewed  03/26/2023  - Note at rest 02 sats  ***% on ***   General appearance:    ***    Mild barr  IRIR  ***     CXR PA and Lateral:   02/10/2023 :    I personally reviewed images and agree with radiology impression as follows:     Coarsened interstitial markings, with no confluent airspace disease.c/w chronic atypical infection    Assessment & Plan:

## 2023-03-26 ENCOUNTER — Encounter: Payer: Self-pay | Admitting: Internal Medicine

## 2023-03-26 ENCOUNTER — Ambulatory Visit (INDEPENDENT_AMBULATORY_CARE_PROVIDER_SITE_OTHER): Payer: Medicare HMO | Admitting: Internal Medicine

## 2023-03-26 VITALS — BP 112/73 | HR 66 | Ht 66.0 in | Wt 200.6 lb

## 2023-03-26 DIAGNOSIS — J9611 Chronic respiratory failure with hypoxia: Secondary | ICD-10-CM

## 2023-03-26 DIAGNOSIS — J449 Chronic obstructive pulmonary disease, unspecified: Secondary | ICD-10-CM

## 2023-03-26 NOTE — Assessment & Plan Note (Signed)
Quit smoking 1991  - PFT's  FEV1  1.07 (40%) ratio 46 and 13% better p B2 and DLCO 61%   -  02/10/2023  After extensive coaching inhaler device,  effectiveness =    75% > try breztri over trelegy due to cough  - 03/26/2023  After extensive coaching inhaler device,  effectiveness =    80% with hfa so continue brezri 2bid and approp saba  -Labs ordered 03/26/2023  :  allergy profile   alpha one AT phenotype     Group D (now reclassified as E) in terms of symptom/risk and laba/lama/ICS  therefore appropriate rx at this point >>>  breztri and approp saba

## 2023-03-26 NOTE — Patient Instructions (Signed)
No change in medications  Make sure you check your oxygen saturation  AT  your highest level of activity (not after you stop)   to be sure it stays over 90% and adjust  02 flow upward to maintain this level if needed but remember to turn it back to previous settings when you stop (to conserve your supply).   Please schedule a follow up visit in 6 months but call sooner if needed  

## 2023-03-26 NOTE — Assessment & Plan Note (Signed)
Again advised:  Make sure you check your oxygen saturation  AT  your highest level of activity (not after you stop)   to be sure it stays over 90% and adjust  02 flow upward to maintain this level if needed but remember to turn it back to previous settings when you stop (to conserve your supply).         Each maintenance medication was reviewed in detail including emphasizing most importantly the difference between maintenance and prns and under what circumstances the prns are to be triggered using an action plan format where appropriate.  Total time for H and P, chart review, counseling, reviewing hfa/neb/02 device(s) and generating customized AVS unique to this office visit / same day charting = 30 min

## 2023-03-31 ENCOUNTER — Ambulatory Visit: Payer: Medicare HMO | Attending: Cardiology

## 2023-03-31 VITALS — BP 102/58 | HR 77 | Ht 66.0 in | Wt 199.0 lb

## 2023-03-31 DIAGNOSIS — I4819 Other persistent atrial fibrillation: Secondary | ICD-10-CM

## 2023-03-31 NOTE — Progress Notes (Signed)
   Nurse Visit   Date of Encounter: 03/31/2023 ID: [name], DOB 09-May-1951, MRN [id number]  PCP:  Rebecka Apley, NP   [facility] Providers Cardiologist:  None Electrophysiologist:  Maurice Small, MD {    Visit Details   VS:  BP (!) 102/58 (BP Location: Left Arm)   Pulse 77   Wt 199 lb (90.3 kg)   SpO2 91%   BMI 32.12 kg/m  , BMI Body mass index is 32.12 kg/m.  Wt Readings from Last 3 Encounters:  03/31/23 199 lb (90.3 kg)  03/26/23 200 lb 9.6 oz (91 kg)  03/10/23 200 lb (90.7 kg)     Reason for visit: RN/EKG Performed today:  WT, Vitals, EKG, Provider consulted:DOD/ Dr. Jon Billings, and Education Changes (medications, testing, etc.) : flecainide 50 mg BID Length of Visit: 30 minutes    Medications Adjustments/Labs and Tests Ordered:  Pt arrived at 2:00 pm on 5/28 for RN/EKG visit. Pt was taken to Pod J, exam room 1. Pt stated she was ok the first two days but has been feeling "jittery" since. Pt stated she was currently feeling "jittery". First EKG taken with new EKG machine lead to concerns of accuracy as pulse rate was different from palpated pulse. Second EKG taken with different machine. Dr Shari Prows (DOD) was made aware of EKG issues.  Per EKG: Vent. Rate-86 bpm, QRS duration-88 ms, QT/Qtc-374/447 ms, PRT axes 27 75  Atrial flutter with variable AV block  EKG Signed by Dr. Shari Prows stating Afib with HR 86  Pt was educated to contact us if there are new symptoms or go to the nearest ED.   New start Flecainide. No titration orders found. Will forward to Dr. Nelly Laurence for further instruction.    Signed, Richelle Ito, RN  03/31/2023 3:37 PM

## 2023-04-01 LAB — CBC WITH DIFFERENTIAL/PLATELET
Basophils Absolute: 0 10*3/uL (ref 0.0–0.2)
Basos: 1 %
EOS (ABSOLUTE): 0.1 10*3/uL (ref 0.0–0.4)
Eos: 2 %
Hematocrit: 35.8 % (ref 34.0–46.6)
Hemoglobin: 11.6 g/dL (ref 11.1–15.9)
Immature Grans (Abs): 0 10*3/uL (ref 0.0–0.1)
Immature Granulocytes: 0 %
Lymphocytes Absolute: 1.5 10*3/uL (ref 0.7–3.1)
Lymphs: 24 %
MCH: 28.7 pg (ref 26.6–33.0)
MCHC: 32.4 g/dL (ref 31.5–35.7)
MCV: 89 fL (ref 79–97)
Monocytes Absolute: 0.4 10*3/uL (ref 0.1–0.9)
Monocytes: 6 %
Neutrophils Absolute: 4.2 10*3/uL (ref 1.4–7.0)
Neutrophils: 67 %
Platelets: 341 10*3/uL (ref 150–450)
RBC: 4.04 x10E6/uL (ref 3.77–5.28)
RDW: 13 % (ref 11.7–15.4)
WBC: 6.2 10*3/uL (ref 3.4–10.8)

## 2023-04-01 LAB — ALPHA-1-ANTITRYPSIN PHENOTYP: A-1 Antitrypsin: 184 mg/dL (ref 101–187)

## 2023-04-06 ENCOUNTER — Ambulatory Visit: Payer: Medicare HMO | Attending: Cardiovascular Disease | Admitting: Cardiovascular Disease

## 2023-04-06 ENCOUNTER — Encounter: Payer: Self-pay | Admitting: Cardiovascular Disease

## 2023-04-06 VITALS — BP 118/70 | HR 72 | Ht 66.0 in | Wt 200.6 lb

## 2023-04-06 DIAGNOSIS — I4819 Other persistent atrial fibrillation: Secondary | ICD-10-CM

## 2023-04-06 NOTE — Patient Instructions (Signed)
Medication Instructions:  Your physician recommends that you continue on your current medications as directed. Please refer to the Current Medication list given to you today. *If you need a refill on your cardiac medications before your next appointment, please call your pharmacy*    Follow-Up: At [facility], you and your health needs are our priority.  As part of our continuing mission to provide you with exceptional heart care, we have created designated Provider Care Teams.  These Care Teams include your primary Cardiologist (physician) and Advanced Practice Providers (APPs -  Physician Assistants and Nurse Practitioners) who all work together to provide you with the care you need, when you need it.  We recommend signing up for the patient portal called "MyChart".  Sign up information is provided on this After Visit Summary.  MyChart is used to connect with patients for Virtual Visits (Telemedicine).  Patients are able to view lab/test results, encounter notes, upcoming appointments, etc.  Non-urgent messages can be sent to your provider as well.   To learn more about what you can do with MyChart, go to [email or web address].    Your next appointment:   6 month(s)  Provider:   You will follow up in the Atrial Fibrillation Clinic located at [facility]. Your provider will be: Clint R. Fenton, PA-C  

## 2023-04-06 NOTE — Progress Notes (Signed)
PCP:  Rebecka Apley, NP Primary Cardiologist: None Electrophysiologist: Maurice Small, MD   [name] is a 72 y.o. female seen today for Maurice Small, MD for routine electrophysiology followup.  Since last being seen in our clinic the patient reports doing very well. Has breakthrough AF, but rarely. Nothing specific that brings it on.  she denies chest pain, dyspnea, PND, orthopnea, nausea, vomiting, dizziness, syncope, edema, weight gain, or early satiety.  Today she notes frequent palpitations, particular in the evenings.  She thinks that is recurrence of atrial fibrillation. She presents today to follow-up results from her heart monitor.  She continues to have palpitations, occasional rapid heart rates.  She has since established with pulmonologist, Dr. Sherene Sires, for Gold 3 COPD and I reviewed his note.    She is here to follow-up today after starting flecainide.  She reports that her symptoms essentially resolved in the first few days after starting the medication.  She has since had a few episodes of palpitations, but overall her arrhythmia burden has improved.  Past Medical History:  Diagnosis Date   Colon polyps    COPD (chronic obstructive pulmonary disease) (HCC)    GERD (gastroesophageal reflux disease)    Hypercholesteremia    Mild mitral regurgitation    Mitral valve prolapse    a. dx 1985, not seen on echoes more recently.   Obesity    Paroxysmal atrial fibrillation (HCC)    Paroxysmal atrial flutter (HCC)    a. dx 05/2017.   Past Surgical History:  Procedure Laterality Date   ABDOMINAL HYSTERECTOMY     ATRIAL FIBRILLATION ABLATION N/A 05/04/2018   Procedure: ATRIAL FIBRILLATION ABLATION;  Surgeon: Hillis Range, MD;  Location: [facility];  Service: Cardiovascular;  Laterality: N/A;   BIOPSY  05/30/2021   Procedure: BIOPSY;  Surgeon: Shellia Cleverly, DO;  Location: [facility] ENDOSCOPY;  Service: Gastroenterology;;   CARDIOVERSION N/A 10/08/2017    Procedure: CARDIOVERSION;  Surgeon: Laqueta Linden, MD;  Location: [facility];  Service: Cardiovascular;  Laterality: N/A;   CESAREAN SECTION     COLONOSCOPY WITH PROPOFOL N/A 05/30/2021   Procedure: COLONOSCOPY WITH PROPOFOL;  Surgeon: Shellia Cleverly, DO;  Location: [facility] ENDOSCOPY;  Service: Gastroenterology;  Laterality: N/A;   HEMOSTASIS CLIP PLACEMENT  05/30/2021   Procedure: HEMOSTASIS CLIP PLACEMENT;  Surgeon: Shellia Cleverly, DO;  Location: [facility] ENDOS[facility];  Service: Gastroenterology;;   POLYPECTOMY  05/30/2021   Procedure: POLYPECTOMY;  Surgeon: Shellia Cleverly, DO;  Location: [facility] ENDOS[facility];  Service: Gastroenterology;;   SUBMUCOSAL TATTOO INJECTION  05/30/2021   Procedure: SUBMUCOSAL TATTOO INJECTION;  Surgeon: Shellia Cleverly, DO;  Location: [facility] ENDOS[facility];  Service: Gastroenterology;;   TEE WITHOUT CARDIOVERSION N/A 05/03/2018   Procedure: TRANSESOPHAGEAL ECHOCARDIOGRAM (TEE);  Surgeon: Pricilla Riffle, MD;  Location: [facility] ENDOSCOPY;  Service: Cardiovascular;  Laterality: N/A;   TUBAL LIGATION      Current Outpatient Medications  Medication Sig Dispense Refill   albuterol (PROAIR HFA) 108 (90 Base) MCG/ACT inhaler Inhale 2 puffs into the lungs every 6 (six) hours as needed for wheezing or shortness of breath. 1 Inhaler 11   albuterol (PROVENTIL) (2.5 MG/3ML) 0.083% nebulizer solution Take 3 mLs (2.5 mg total) by nebulization every 6 (six) hours as needed for wheezing or shortness of breath. 240 mL 1   apixaban (ELIQUIS) 5 MG TABS tablet Take 1 tablet (5 mg total) by mouth 2 (two) times daily. 180 tablet 1   Bempedoic Acid-Ezetimibe (NEXLIZET)  180-10 MG TABS Take 1 tablet by mouth daily. 90 tablet 1   Budeson-Glycopyrrol-Formoterol (BREZTRI AEROSPHERE) 160-9-4.8 MCG/ACT AERO Inhale 2 puffs into the lungs 2 (two) times daily. 10.7 g 11   Cholecalciferol (VITAMIN D3) 5000 UNITS CAPS Take 5,000 Units by mouth daily.      cyanocobalamin (VITAMIN B12) 1000 MCG tablet Take 1,000 mcg by  mouth daily.     flecainide (TAMBOCOR) 50 MG tablet Take 1 tablet (50 mg total) by mouth 2 (two) times daily. 180 tablet 3   guaiFENesin (MUCINEX) 600 MG 12 hr tablet Up to 2 every 12 hours as needed     metoprolol tartrate (LOPRESSOR) 25 MG tablet Take 1 tablet (25 mg total) by mouth 2 (two) times daily. 180 tablet 1   mirabegron ER (MYRBETRIQ) 25 MG TB24 tablet Take 1 tablet (25 mg total) by mouth daily. 90 tablet 1   montelukast (SINGULAIR) 10 MG tablet Take 1 tablet (10 mg total) by mouth at bedtime. 90 tablet 1   OXYGEN Inhale 2 L into the lungs continuous.     pantoprazole (PROTONIX) 40 MG tablet TAKE 1 TABLET DAILY 90 tablet 0   VASCEPA 1 g capsule TAKE (2) CAPSULES TWICE DAILY. 120 capsule 5   Current Facility-Administered Medications  Medication Dose Route Frequency Provider Last Rate Last Admin   cyanocobalamin ((VITAMIN B-12)) injection 1,000 mcg  1,000 mcg Intramuscular Q30 days Gwenlyn Fudge, FNP   1,000 mcg at 08/14/21 1030    Allergies  Allergen Reactions   Penicillins Rash    Has patient had a PCN reaction causing immediate rash, facial/tongue/throat swelling, SOB or lightheadedness with hypotension: Yes Has patient had a PCN reaction causing severe rash involving mucus membranes or skin necrosis: Yes Has patient had a PCN reaction that required hospitalization: No Has patient had a PCN reaction occurring within the last 10 years: No If all of the above answers are "NO", then may proceed with Cephalosporin use.    Dilaudid [Hydromorphone Hcl] Rash   Statins Other (See Comments)    myopathy myopathy    Social History   Socioeconomic History   Marital status: Married    Spouse name: Alvis   Number of children: 1   Years of education: 12   Highest education level: 12th grade  Occupational History   Occupation: Child psychotherapist: [employer]    Comment: retired   Occupation: [employer]     Comment: retired  Tobacco Use   Smoking status: Former    Packs/day:  1.00    Years: 20.00    Additional pack years: 0.00    Total pack years: 20.00    Types: Cigarettes    Quit date: 08/14/1990    Years since quitting: 32.6    Passive exposure: Never   Smokeless tobacco: Never  Vaping Use   Vaping Use: Never used  Substance and Sexual Activity   Alcohol use: No    Alcohol/week: 0.0 standard drinks of alcohol   Drug use: No   Sexual activity: Not Currently  Other Topics Concern   Not on file  Social History Narrative   Lives home with her husband   Oxygen dependent   Involved in church - socially active   Not very physically active, but doesn't have to use assistive device to walk   Social Determinants of Health   Financial Resource Strain: Low Risk  (06/21/2021)   Overall Financial Resource Strain (CARDIA)    Difficulty of Paying Living Expenses:  Not hard at all  Food Insecurity: No Food Insecurity (06/21/2021)   Hunger Vital Sign    Worried About Running Out of Food in the Last Year: Never true    Ran Out of Food in the Last Year: Never true  Transportation Needs: No Transportation Needs (06/21/2021)   PRAPARE - Administrator, Civil Service (Medical): No    Lack of Transportation (Non-Medical): No  Physical Activity: Inactive (06/21/2021)   Exercise Vital Sign    Days of Exercise per Week: 0 days    Minutes of Exercise per Session: 0 min  Stress: No Stress Concern Present (06/21/2021)   [employer] of Occupational Health - Occupational Stress Questionnaire    Feeling of Stress : Not at all  Social Connections: Socially Integrated (06/21/2021)   Social Connection and Isolation Panel [NHANES]    Frequency of Communication with Friends and Family: More than three times a week    Frequency of Social Gatherings with Friends and Family: More than three times a week    Attends Religious Services: More than 4 times per year    Active Member of [employer] or Organizations: Yes    Attends Engineer, structural: More than 4 times  per year    Marital Status: Married  Catering manager Violence: Not At Risk (06/21/2021)   Humiliation, Afraid, Rape, and Kick questionnaire    Fear of Current or Ex-Partner: No    Emotionally Abused: No    Physically Abused: No    Sexually Abused: No     Review of Systems: All other systems reviewed and are otherwise negative except as noted above.  Physical Exam: Vitals:   04/06/23 1026  BP: 118/70  Pulse: 72  SpO2: 94%  Weight: 200 lb 9.6 oz (91 kg)  Height: 5\' 6"  (1.676 m)     Gen: Appears comfortable, well-nourished CV: irregular rhythm -- bigeminy pattern, no dependent edema Pulm: breathing easily  EKG is ordered. Personal review of EKG from today shows NSR with PACs in Bigeminy  Additional studies reviewed include: Previous EP office notes.   6 day Zio monitor 01/26/23 Sinus rhythm 57-119, avg 82 Multiple episodes of atrial arrhythmia -- atrial runs, frequent PACs, possible flutter, burden 18%. Often associated with symptoms of heart racing  Assessment and Plan:  1. Persistent Afib, and flutter S/p PVI and CTI ablation with Dr. Johney Frame Continue Eliquis for CHA2DS2-VASc of at least 3.     Continue apixaban. Having symptoms of AF. Zio shows multiple arrhythmia types -- frequent PACs, atrial tachycardia suspected flutter and fibrillation.  She reports symptomatic improvement with flecainide.  I reviewed her ECG, labs, and CT today.  I explained that these precautions are necessary to prevent potentially life-threatening arrhythmias. I think she is suitable to continue flecainide.    2. Diastolic dysfunction Echo 11/2018 LVEF 55-60% Volume status stable on exam.  Labs stable 04/24/2022   3. COPD Per PCP, Dr. 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

## 2023-05-12 ENCOUNTER — Ambulatory Visit: Payer: Medicare HMO | Admitting: Cardiovascular Disease

## 2023-09-22 ENCOUNTER — Encounter: Payer: Self-pay | Admitting: Internal Medicine

## 2023-09-22 ENCOUNTER — Ambulatory Visit: Payer: Medicare HMO | Admitting: Internal Medicine

## 2023-09-22 VITALS — BP 117/58 | HR 71 | Ht 66.0 in | Wt 204.0 lb

## 2023-09-22 DIAGNOSIS — J449 Chronic obstructive pulmonary disease, unspecified: Secondary | ICD-10-CM | POA: Diagnosis not present

## 2023-09-22 DIAGNOSIS — J9611 Chronic respiratory failure with hypoxia: Secondary | ICD-10-CM

## 2023-09-22 NOTE — Assessment & Plan Note (Signed)
Started 02  Feb 20220  - 09/22/2023   Walked on 2lpm POC  x  1  lap(s) =  approx 150  ft  @ slow pace, stopped due to knee pain > sob  with lowest 02 sats 92%   Advise:  2lpm should be adequate unless becomes much more active but this is unlikely due to knee pain/ obesity   F/u q 6 m  Make sure you check your oxygen saturation  AT  your highest level of activity (not after you stop)   to be sure it stays over 90% and adjust  02 flow upward to maintain this level if needed but remember to turn it back to previous settings when you stop (to conserve your supply).   Each maintenance medication was reviewed in detail including emphasizing most importantly the difference between maintenance and prns and under what circumstances the prns are to be triggered using an action plan format where appropriate.  Total time for H and P, chart review, counseling, reviewing hfa device(s) , directly observing portions of ambulatory 02 saturation study/ and generating customized AVS unique to this office visit / same day charting  > 30 min

## 2023-09-22 NOTE — Progress Notes (Signed)
Subjective:   Patient ID: [name], female    DOB: [birth year]    MRN: [id number]    Brief patient profile:  72 yowf quit smoking in 1991 and dx as copd 1993  in [facility] pulmonary clinic referred back to [facility] clinic 09/05/2013  By Dr Valentina Gu PA Prudy Feeler  for eval of abn CXR  History of Present Illness  09/05/2013 1st [facility] office visit/ [name] cc acute onset shaking chills, cough/ gag 08/14/13 admitted to [facility] and rx as CAP  but now  feeling 100% baseline doe on  spiriva/ advair/ singulair no 02 and some am cough > most the time not productive. rec Try stopping the pm dose of advair for two weeks then stop the am dose also and see if it makes any difference Continue spiriva one capsule daily  Only use your albuterol(proair) as rescue     02/10/2023  Re-establish  ov/[place] office/[name] re: GOLD 3 COPD  maint on trelegy 100  / 02 dep since 2020 never had covid  Chief Complaint  Patient presents with   Pulmonary Consult    Referred by Sharon Seller, NP. Pt c/o increased DOE for the past 6 months, esp worse x 2 months.   Dyspnea:  limited by weak legs  and knees hurt so very inactive mostly housebound rides scooter when shops  Cough: still smoker's rattle esp in am  white mucus / sometimes severe cough / heaves gag sev times a week  Sleeping: flat bed / L side/ one pillow s resp cc  SABA use: inhaler once a day, neb 3-4 x times a week  02: 2lpm POC when out with sats in the 80s sometimes but does not titrate Rec Plan A = Automatic = Always=   Breztri Take 2 puffs first thing in am and then another 2 puffs about 12 hours later.   Work on inhaler technique:  Plan B = Backup (to supplement plan A, not to replace it) Only use your albuterol inhaler as a rescue medication  Plan C = Crisis (instead of Plan B but only if Plan B stops working) - only use your albuterol nebulizer if you first try Plan B  Plan D = Deltasone - ok to take if ABC not working   Prednisone 10 mg take  4 each am x 2 days,   2 each am x 2 days,  1 each am x 2 days and stop  For cough / congestion > mucinex 600 mg up to 2 every 12hours (total of 2400 mg in 24 hours)  Make sure you check your oxygen saturation  AT  your highest level of activity (not after you stop)   to be sure it stays over 90%       03/26/2023  f/u ov/[place] office/[name] re: GOLD 3 copd  maint on breztri   and did not need Plan D but once (when 1st started breztri)  Chief Complaint  Patient presents with   Follow-up    Pt f/u states that her oxygen does still drop when she is exerting herself but using 2-3LO2 does help regulate her sats. She states that she was started on a new hear medication 2 days ago and can tell a difference in her breathing w/ that as well   Dyspnea:  22ft  and stops due to breathing  Cough: still rattling/ min mucoid Sleeping: Flat bed, one pillow no resp cc  SABA use: avg 3 x weekly /  no neb  02: 2-3 lpm   does not titrate Rec No change in medications Make sure you check your oxygen saturation  AT  your highest level of activity (not after you stop)   to be sure it stays over 90%  Please schedule a follow up visit in 6  months but call sooner if needed     09/22/2023  f/u ov/[place] office/[name] re: COPD GOLD 3/ 02 dep  maint on breztri  Chief Complaint  Patient presents with   COPD   Shortness of Breath  Dyspnea:  improved able to do housework, ride scooter due to knees  Cough: mostly daytime / slt rattle whitish  Sleeping: flat bed one pillow s  resp cc  SABA use: once a day x one puff  02: 2-3lpm reports still drops as low 82% and does not titrate   No obvious day to day or daytime variability or assoc   mucus plugs or hemoptysis or cp or chest tightness, subjective wheeze or overt sinus or hb symptoms.    Also denies any obvious fluctuation of symptoms with weather or environmental changes or other aggravating or alleviating factors except as outlined  above   No unusual exposure hx or h/o childhood pna/ asthma or knowledge of premature birth.  Current Allergies, Complete Past Medical History, Past Surgical History, Family History, and Social History were reviewed in [employer] record.  ROS  The following are not active complaints unless bolded Hoarseness, sore throat, dysphagia, dental problems, itching, sneezing,  nasal congestion or discharge of excess mucus or purulent secretions, ear ache,   fever, chills, sweats, unintended wt loss or wt gain, classically pleuritic or exertional cp,  orthopnea pnd or arm/hand swelling  or leg swelling, presyncope, palpitations, abdominal pain, anorexia, nausea, vomiting, diarrhea  or change in bowel habits or change in bladder habits, change in stools or change in urine, dysuria, hematuria,  rash, arthralgias, visual complaints, headache, numbness, weakness or ataxia or problems with walking or coordination,  change in mood or  memory.        Current Meds  Medication Sig   albuterol (PROAIR HFA) 108 (90 Base) MCG/ACT inhaler Inhale 2 puffs into the lungs every 6 (six) hours as needed for wheezing or shortness of breath.   albuterol (PROVENTIL) (2.5 MG/3ML) 0.083% nebulizer solution Take 3 mLs (2.5 mg total) by nebulization every 6 (six) hours as needed for wheezing or shortness of breath.   apixaban (ELIQUIS) 5 MG TABS tablet Take 1 tablet (5 mg total) by mouth 2 (two) times daily.   Bempedoic Acid-Ezetimibe (NEXLIZET) 180-10 MG TABS Take 1 tablet by mouth daily.   Budeson-Glycopyrrol-Formoterol (BREZTRI AEROSPHERE) 160-9-4.8 MCG/ACT AERO Inhale 2 puffs into the lungs 2 (two) times daily.   Cholecalciferol (VITAMIN D3) 5000 UNITS CAPS Take 5,000 Units by mouth daily.    flecainide (TAMBOCOR) 50 MG tablet Take 1 tablet (50 mg total) by mouth 2 (two) times daily.   guaiFENesin (MUCINEX) 600 MG 12 hr tablet Up to 2 every 12 hours as needed   metoprolol tartrate (LOPRESSOR) 25 MG  tablet Take 1 tablet (25 mg total) by mouth 2 (two) times daily.   montelukast (SINGULAIR) 10 MG tablet Take 1 tablet (10 mg total) by mouth at bedtime.   MYRBETRIQ 50 MG TB24 tablet Take 50 mg by mouth daily.   OXYGEN Inhale 2 L into the lungs continuous.   pantoprazole (PROTONIX) 40 MG tablet TAKE 1 TABLET DAILY   VASCEPA 1  g capsule TAKE (2) CAPSULES TWICE DAILY.   vitamin B-12 (CYANOCOBALAMIN) 100 MCG tablet Take 100 mcg by mouth daily.   Current Facility-Administered Medications for the 09/22/23 encounter (Office Visit) with Nyoka Cowden, MD  Medication   cyanocobalamin ((VITAMIN B-12)) injection 1,000 mcg                Objective:   Physical Exam  wts  09/22/2023     204  03/26/2023       200  02/10/2023         201   10/03/13 213 lb (96.616 kg)  09/05/13 215 lb (97.523 kg)  08/30/13 213 lb (96.616 kg)      Vital signs reviewed  09/22/2023  - Note at rest 02 sats  91% on 2lpm POC   General appearance:    obese amb wf  nad  HEENT : Oropharynx  clear     NECK :  without  apparent JVD/ palpable Nodes/TM    LUNGS: no acc muscle use,  Mild barrel  contour chest wall with bilateral scattered exp rhonchi  and  without cough on insp or exp maneuvers  and mild  Hyperresonant  to  percussion bilaterally     CV:  RRR  no s3 or murmur or increase in P2, and no edema   ABD:  Obese/ soft and nontender with pos end  insp Hoover's  in the supine position.  No bruits or organomegaly appreciated   MS:  Nl gait/ ext warm without deformities Or obvious joint restrictions  calf tenderness, cyanosis or clubbing     SKIN: warm and dry without lesions    NEURO:  alert, approp, nl sensorium with  no motor or cerebellar deficits apparent.           Assessment & Plan:

## 2023-09-22 NOTE — Patient Instructions (Signed)
No change in medications  Make sure you check your oxygen saturation  AT  your highest level of activity (not after you stop)   to be sure it stays over 90% and adjust  02 flow upward to maintain this level if needed but remember to turn it back to previous settings when you stop (to conserve your supply).   Please schedule a follow up visit in 6 months but call sooner if needed

## 2023-09-22 NOTE — Assessment & Plan Note (Signed)
-   PFT's  FEV1  1.07 (40%) ratio 46 and 13% better p B2 and DLCO 61%   -  02/10/2023  After extensive coaching inhaler device,  effectiveness =    75% > try breztri over trelegy due to cough  - 03/26/2023  After extensive coaching inhaler device,  effectiveness =    80% with hfa so continue brezri 2bid and approp saba  - Labs ordered 03/26/2023  :  allergy EOS  0.1   alpha one AT phenotype  MM  level 184   Group D (now reclassified as E) in terms of symptom/risk and laba/lama/ICS  therefore appropriate rx at this point >>>  breztri and approp saba   Reminded: Re SABA :  I spent extra time with pt today reviewing appropriate use of albuterol for prn use on exertion with the following points: 1) saba is for relief of sob that does not improve by walking a slower pace or resting but rather if the pt does not improve after trying this first. 2) If the pt is convinced, as many are, that saba helps recover from activity faster then it's easy to tell if this is the case by re-challenging : ie stop, take the inhaler, then p 5 minutes try the exact same activity (intensity of workload) that just caused the symptoms and see if they are substantially diminished or not after saba 3) if there is an activity that reproducibly causes the symptoms, try the saba 15 min before the activity on alternate days   If in fact the saba really does help, then fine to continue to use it prn but advised may need to look closer at the maintenance regimen being used to achieve better control of airways disease with exertion.

## 2023-10-06 ENCOUNTER — Ambulatory Visit (HOSPITAL_COMMUNITY)
Admission: RE | Admit: 2023-10-06 | Discharge: 2023-10-06 | Disposition: A | Discharge status: Home / Self Care | Payer: Medicare HMO | Source: Ambulatory Visit | Attending: Internal Medicine | Admitting: Internal Medicine

## 2023-10-06 VITALS — BP 128/62 | HR 67 | Ht 66.0 in | Wt 208.4 lb

## 2023-10-06 DIAGNOSIS — I4891 Unspecified atrial fibrillation: Secondary | ICD-10-CM | POA: Diagnosis not present

## 2023-10-06 DIAGNOSIS — D6869 Other thrombophilia: Secondary | ICD-10-CM | POA: Insufficient documentation

## 2023-10-06 DIAGNOSIS — Z7901 Long term (current) use of anticoagulants: Secondary | ICD-10-CM | POA: Diagnosis not present

## 2023-10-06 DIAGNOSIS — Z79899 Other long term (current) drug therapy: Secondary | ICD-10-CM | POA: Diagnosis not present

## 2023-10-06 DIAGNOSIS — I4892 Unspecified atrial flutter: Secondary | ICD-10-CM | POA: Diagnosis not present

## 2023-10-06 DIAGNOSIS — Z5181 Encounter for therapeutic drug level monitoring: Secondary | ICD-10-CM

## 2023-10-06 DIAGNOSIS — I498 Other specified cardiac arrhythmias: Secondary | ICD-10-CM | POA: Insufficient documentation

## 2023-10-06 DIAGNOSIS — I4819 Other persistent atrial fibrillation: Secondary | ICD-10-CM | POA: Insufficient documentation

## 2023-10-06 NOTE — Progress Notes (Signed)
Primary Care Physician: Rebecka Apley, NP Referring Physician:Dr. Johney Frame   [name] is a 72 y.o. female with a h/o afib/flutter that was seen by Dr. Johney Frame in December after failing amiodarone. She was scheduled for ablation the end of January but developed pneumonia. After that cleared, she developed bronchitis. After that cleared, she developed the shingles. She is now in the afib clinic for f/u. She remains in atrial flutter at 63 bpm. Remains on amiodarone. She does want to go ahead and schedule ablation.    F/u ablation 7/30, performed 7/2. She has had a few episodes of afib usually lasts less than 1-2 hours. Continues on amiodarone. No swallowing or groin issues.  On evaluation today, she is currently in NSR. Seen by Dr. Nelly Laurence on 04/06/23 and remains on flecainide 50 mg BID for rhythm control. It is noted reluctant to try amiodarone in the future. She has had a couple of sporadic episodes of Afib since last office visit. Her burden has overall been very low; episodes have lasted from 10-15 minutes to a couple of hours. She notes it is much better since being on flecainide. No bleeding issues on Eliquis 5 mg BID.   Today, she denies symptoms of palpitations, chest pain, shortness of breath, orthopnea, PND, lower extremity edema, dizziness, presyncope, syncope, or neurologic sequela. The patient is tolerating medications without difficulties and is otherwise without complaint today.   Past Medical History:  Diagnosis Date   Colon polyps    COPD (chronic obstructive pulmonary disease) (HCC)    GERD (gastroesophageal reflux disease)    Hypercholesteremia    Mild mitral regurgitation    Mitral valve prolapse    a. dx 1985, not seen on echoes more recently.   Obesity    Paroxysmal atrial fibrillation (HCC)    Paroxysmal atrial flutter (HCC)    a. dx 05/2017.   Past Surgical History:  Procedure Laterality Date   ABDOMINAL HYSTERECTOMY     ATRIAL FIBRILLATION ABLATION N/A  05/04/2018   Procedure: ATRIAL FIBRILLATION ABLATION;  Surgeon: Hillis Range, MD;  Location: [facility];  Service: Cardiovascular;  Laterality: N/A;   BIOPSY  05/30/2021   Procedure: BIOPSY;  Surgeon: Shellia Cleverly, DO;  Location: [facility];  Service: Gastroenterology;;   CARDIOVERSION N/A 10/08/2017   Procedure: CARDIOVERSION;  Surgeon: Laqueta Linden, MD;  Location: [facility];  Service: Cardiovascular;  Laterality: N/A;   CESAREAN SECTION     COLONOSCOPY WITH PROPOFOL N/A 05/30/2021   Procedure: COLONOSCOPY WITH PROPOFOL;  Surgeon: Shellia Cleverly, DO;  Location: [facility];  Service: Gastroenterology;  Laterality: N/A;   HEMOSTASIS CLIP PLACEMENT  05/30/2021   Procedure: HEMOSTASIS CLIP PLACEMENT;  Surgeon: Shellia Cleverly, DO;  Location: [facility];  Service: Gastroenterology;;   POLYPECTOMY  05/30/2021   Procedure: POLYPECTOMY;  Surgeon: Shellia Cleverly, DO;  Location: [facility];  Service: Gastroenterology;;   SUBMUCOSAL TATTOO INJECTION  05/30/2021   Procedure: SUBMUCOSAL TATTOO INJECTION;  Surgeon: Shellia Cleverly, DO;  Location: [facility];  Service: Gastroenterology;;   TEE WITHOUT CARDIOVERSION N/A 05/03/2018   Procedure: TRANSESOPHAGEAL ECHOCARDIOGRAM (TEE);  Surgeon: Pricilla Riffle, MD;  Location: [facility] ENDOSCOPY;  Service: Cardiovascular;  Laterality: N/A;   TUBAL LIGATION      Current Outpatient Medications  Medication Sig Dispense Refill   albuterol (PROAIR HFA) 108 (90 Base) MCG/ACT inhaler Inhale 2 puffs into the lungs every 6 (six) hours as needed for wheezing or shortness of breath. 1 Inhaler  11   albuterol (PROVENTIL) (2.5 MG/3ML) 0.083% nebulizer solution Take 3 mLs (2.5 mg total) by nebulization every 6 (six) hours as needed for wheezing or shortness of breath. 240 mL 1   apixaban (ELIQUIS) 5 MG TABS tablet Take 1 tablet (5 mg total) by mouth 2 (two) times daily. 180 tablet 1   Bempedoic Acid-Ezetimibe (NEXLIZET) 180-10 MG TABS Take 1  tablet by mouth daily. 90 tablet 1   Budeson-Glycopyrrol-Formoterol (BREZTRI AEROSPHERE) 160-9-4.8 MCG/ACT AERO Inhale 2 puffs into the lungs 2 (two) times daily. 10.7 g 11   Cholecalciferol (VITAMIN D3) 5000 UNITS CAPS Take 5,000 Units by mouth daily.      flecainide (TAMBOCOR) 50 MG tablet Take 1 tablet (50 mg total) by mouth 2 (two) times daily. 180 tablet 3   guaiFENesin (MUCINEX) 600 MG 12 hr tablet Up to 2 every 12 hours as needed (Patient taking differently: Up to 2 tablets every 12 hours as needed)     metoprolol tartrate (LOPRESSOR) 25 MG tablet Take 1 tablet (25 mg total) by mouth 2 (two) times daily. 180 tablet 1   montelukast (SINGULAIR) 10 MG tablet Take 1 tablet (10 mg total) by mouth at bedtime. 90 tablet 1   MYRBETRIQ 50 MG TB24 tablet Take 50 mg by mouth daily.     OXYGEN Inhale 2 L into the lungs continuous.     pantoprazole (PROTONIX) 40 MG tablet TAKE 1 TABLET DAILY 90 tablet 0   VASCEPA 1 g capsule TAKE (2) CAPSULES TWICE DAILY. 120 capsule 5   vitamin B-12 (CYANOCOBALAMIN) 100 MCG tablet Take 100 mcg by mouth daily.     Current Facility-Administered Medications  Medication Dose Route Frequency Provider Last Rate Last Admin   cyanocobalamin ((VITAMIN B-12)) injection 1,000 mcg  1,000 mcg Intramuscular Q30 days Gwenlyn Fudge, FNP   1,000 mcg at 08/14/21 1030    Allergies  Allergen Reactions   Penicillins Rash    Has patient had a PCN reaction causing immediate rash, facial/tongue/throat swelling, SOB or lightheadedness with hypotension: Yes Has patient had a PCN reaction causing severe rash involving mucus membranes or skin necrosis: Yes Has patient had a PCN reaction that required hospitalization: No Has patient had a PCN reaction occurring within the last 10 years: No If all of the above answers are "NO", then may proceed with Cephalosporin use.    Dilaudid [Hydromorphone Hcl] Rash   Statins Other (See Comments)    myopathy myopathy   ROS- All systems are  reviewed and negative except as per the HPI above  Physical Exam: Vitals:   10/06/23 1043  BP: 128/62  Pulse: 67  Weight: 94.5 kg  Height: 5\' 6"  (1.676 m)    Wt Readings from Last 3 Encounters:  10/06/23 94.5 kg  09/22/23 92.5 kg  04/06/23 91 kg    Labs: Lab Results  Component Value Date   NA 141 03/10/2023   K 4.1 03/10/2023   CL 99 03/10/2023   CO2 27 03/10/2023   GLUCOSE 115 (H) 03/10/2023   BUN 15 03/10/2023   CREATININE 0.90 03/10/2023   CALCIUM 9.4 03/10/2023   Lab Results  Component Value Date   INR 1.09 05/10/2017   Lab Results  Component Value Date   CHOL 137 08/21/2021   HDL 34 (L) 08/21/2021   LDLCALC 72 08/21/2021   TRIG 183 (H) 08/21/2021   GEN- The patient is well appearing, alert and oriented x 3 today.   Neck - no JVD or carotid bruit noted  Lungs- Clear to ausculation bilaterally, normal work of breathing Heart- Regular rate and rhythm, no murmurs, rubs or gallops, PMI not laterally displaced Extremities- no clubbing, cyanosis, or edema Skin - no rash or ecchymosis noted  EKG-  Vent. rate 67 BPM PR interval 132 ms QRS duration 92 ms QT/QTcB 406/429 ms P-R-T axes -4 62 75 Normal sinus rhythm with sinus arrhythmia Normal ECG When compared with ECG of 01-Jun-2018 14:40, PREVIOUS ECG IS PRESENT  Epic records reviewed  Echo-11/05/2018  Study Conclusions   - Left ventricle: The cavity size was normal. There was mild    concentric hypertrophy. Systolic function was normal. The    estimated ejection fraction was in the range of 55% to 60%. Wall    motion was normal; there were no regional wall motion    abnormalities. Features are consistent with a pseudonormal left    ventricular filling pattern, with concomitant abnormal relaxation    and increased filling pressure (grade 2 diastolic dysfunction).    Doppler parameters are consistent with indeterminate ventricular    filling pressure.  - Aortic valve: Transvalvular velocity was within  the normal range.    There was no stenosis. There was no regurgitation.  - Mitral valve: Transvalvular velocity was within the normal range.    There was no evidence for stenosis. There was mild regurgitation.  - Right ventricle: The cavity size was normal. Wall thickness was    normal. Systolic function was normal.  - Atrial septum: No defect or patent foramen ovale was identified.  - Tricuspid valve: There was trivial regurgitation.  - Pulmonary arteries: Systolic pressure was within the normal    range. PA peak pressure: 27 mm Hg (S).    Assessment and Plan: 1. Persistent afib/flutter S/P Ablation 2019 by Dr. Johney Frame.  Maintaining SR. Intervals are stable. Continue flecainide 50 mg BID.  Continue Lopressor 25 mg BID.    2. Secondary hypercoagulable state due to atrial fibrillation Continue Eliquis 5 mg BID.    F/u 6 months with Dr. Nelly Laurence.    Justin Mend, PA-C [facility] [address] [place], [place] [address] [phone]

## 2024-01-17 ENCOUNTER — Emergency Department (HOSPITAL_COMMUNITY)
Admission: EM | Admit: 2024-01-17 | Discharge: 2024-01-17 | Disposition: A | Discharge status: Home / Self Care | Attending: Emergency Medicine | Admitting: Emergency Medicine

## 2024-01-17 ENCOUNTER — Other Ambulatory Visit: Payer: Self-pay

## 2024-01-17 ENCOUNTER — Emergency Department (HOSPITAL_COMMUNITY)

## 2024-01-17 DIAGNOSIS — Z7951 Long term (current) use of inhaled steroids: Secondary | ICD-10-CM | POA: Insufficient documentation

## 2024-01-17 DIAGNOSIS — Z7901 Long term (current) use of anticoagulants: Secondary | ICD-10-CM | POA: Diagnosis not present

## 2024-01-17 DIAGNOSIS — J441 Chronic obstructive pulmonary disease with (acute) exacerbation: Secondary | ICD-10-CM | POA: Insufficient documentation

## 2024-01-17 DIAGNOSIS — R059 Cough, unspecified: Secondary | ICD-10-CM | POA: Diagnosis present

## 2024-01-17 DIAGNOSIS — Z7952 Long term (current) use of systemic steroids: Secondary | ICD-10-CM | POA: Insufficient documentation

## 2024-01-17 LAB — CBC WITH DIFFERENTIAL/PLATELET
Abs Immature Granulocytes: 0.01 10*3/uL (ref 0.00–0.07)
Basophils Absolute: 0 10*3/uL (ref 0.0–0.1)
Basophils Relative: 0 %
Eosinophils Absolute: 0.2 10*3/uL (ref 0.0–0.5)
Eosinophils Relative: 3 %
HCT: 40 % (ref 36.0–46.0)
Hemoglobin: 12.5 g/dL (ref 12.0–15.0)
Immature Granulocytes: 0 %
Lymphocytes Relative: 24 %
Lymphs Abs: 1.6 10*3/uL (ref 0.7–4.0)
MCH: 29.8 pg (ref 26.0–34.0)
MCHC: 31.3 g/dL (ref 30.0–36.0)
MCV: 95.2 fL (ref 80.0–100.0)
Monocytes Absolute: 0.3 10*3/uL (ref 0.1–1.0)
Monocytes Relative: 5 %
Neutro Abs: 4.7 10*3/uL (ref 1.7–7.7)
Neutrophils Relative %: 68 %
Platelets: 400 10*3/uL (ref 150–400)
RBC: 4.2 MIL/uL (ref 3.87–5.11)
RDW: 12.8 % (ref 11.5–15.5)
WBC: 6.9 10*3/uL (ref 4.0–10.5)
nRBC: 0 % (ref 0.0–0.2)

## 2024-01-17 LAB — COMPREHENSIVE METABOLIC PANEL
ALT: 18 U/L (ref 0–44)
AST: 20 U/L (ref 15–41)
Albumin: 3.5 g/dL (ref 3.5–5.0)
Alkaline Phosphatase: 68 U/L (ref 38–126)
Anion gap: 11 (ref 5–15)
BUN: 16 mg/dL (ref 8–23)
CO2: 28 mmol/L (ref 22–32)
Calcium: 9.1 mg/dL (ref 8.9–10.3)
Chloride: 100 mmol/L (ref 98–111)
Creatinine, Ser: 0.89 mg/dL (ref 0.44–1.00)
GFR, Estimated: >60 mL/min (ref >60)
Glucose, Bld: 150 mg/dL — ABNORMAL HIGH (ref 70–99)
Potassium: 3.7 mmol/L (ref 3.5–5.1)
Sodium: 139 mmol/L (ref 135–145)
Total Bilirubin: 0.7 mg/dL (ref 0.0–1.2)
Total Protein: 7 g/dL (ref 6.5–8.1)

## 2024-01-17 LAB — RESP PANEL BY RT-PCR (RSV, FLU A&B, COVID)  RVPGX2
Influenza A by PCR: NEGATIVE
Influenza B by PCR: NEGATIVE
Resp Syncytial Virus by PCR: NEGATIVE
SARS Coronavirus 2 by RT PCR: NEGATIVE

## 2024-01-17 LAB — BRAIN NATRIURETIC PEPTIDE: B Natriuretic Peptide: 75 pg/mL (ref 0.0–100.0)

## 2024-01-17 MED ORDER — METHYLPREDNISOLONE SODIUM SUCC 125 MG IJ SOLR
125.0000 mg | Freq: Once | INTRAMUSCULAR | Status: AC
  Administered 2024-01-17: 125 mg via INTRAVENOUS
  Filled 2024-01-17: qty 2

## 2024-01-17 MED ORDER — ALBUTEROL SULFATE (2.5 MG/3ML) 0.083% IN NEBU
10.0000 mg | INHALATION_SOLUTION | RESPIRATORY_TRACT | Status: AC
  Administered 2024-01-17: 10 mg via RESPIRATORY_TRACT
  Filled 2024-01-17: qty 12

## 2024-01-17 MED ORDER — PREDNISONE 20 MG PO TABS: 40.0000 mg | ORAL_TABLET | Freq: Every day | ORAL | 0 refills | Status: DC

## 2024-01-17 NOTE — Discharge Instructions (Signed)
 Please take the albuterol inhaler every 4 hours for the next 24 hours, after that it can be every 4 hours as needed.  Your x-ray shows no signs of pneumonia, your blood work was reassuring, I do want you to take prednisone daily for 5 days.  Thank you for allowing [place] to treat you in the emergency department today.  After reviewing your examination and potential testing that was done it appears that you are safe to go home.  I would like for you to follow-up with your doctor within the next several days, have them obtain your records and follow-up with them to review all potential tests and results from your visit.  If you should develop severe or worsening symptoms return to the emergency department immediately

## 2024-01-17 NOTE — ED Notes (Signed)
 Patient ambulated with pulse ox on baseline 2L O2. Heart maintained in 80s. SpO2 dropped to 87%. Patient also reports increasing dyspnea and appears more short of breath

## 2024-01-17 NOTE — ED Provider Notes (Signed)
 [place] EMERGENCY DEPARTMENT AT [facility] Provider Note   CSN: [id number] Arrival date & time: 01/17/24  1931     History  Chief Complaint  Patient presents with   Shortness of Breath    [name] is a 73 y.o. female.   Shortness of Breath    This patient is a 73 year old female, she presents to the hospital with shortness of breath.  She reports that she is on 3 L by nasal cannula chronically for her COPD, she takes daily Breztri and as needed albuterol, she is on Eliquis for atrial fibrillation.  She states that today she has been more short of breath, she has had some coughing, she has no swelling of the legs, no significant chest pain though she does have a tightness.  No vomiting, no diarrhea, nobody's been sick around her, no fevers or chills  Home Medications Prior to Admission medications   Medication Sig Start Date End Date Taking? Authorizing Provider  predniSONE (DELTASONE) 20 MG tablet Take 2 tablets (40 mg total) by mouth daily. 01/17/24  Yes Eber Hong, MD  albuterol [facility] HFA) [phone] Base) MCG/ACT inhaler Inhale 2 puffs into the lungs every 6 (six) hours as needed for wheezing or shortness of breath. 08/23/18   Remus Loffler, PA-C  albuterol (PROVENTIL) (2.5 MG/3ML) 0.083% nebulizer solution Take 3 mLs (2.5 mg total) by nebulization every 6 (six) hours as needed for wheezing or shortness of breath. 12/08/18   Remus Loffler, PA-C  apixaban (ELIQUIS) 5 MG TABS tablet Take 1 tablet (5 mg total) by mouth 2 (two) times daily. 08/21/21   Gwenlyn Fudge, FNP  Bempedoic Acid-Ezetimibe (NEXLIZET) 180-10 MG TABS Take 1 tablet by mouth daily. 08/21/21   Gwenlyn Fudge, FNP  Budeson-Glycopyrrol-Formoterol (BREZTRI AEROSPHERE) 160-9-4.8 MCG/ACT AERO Inhale 2 puffs into the lungs 2 (two) times daily. 02/10/23   Nyoka Cowden, MD  Cholecalciferol (VITAMIN D3) 5000 UNITS CAPS Take 5,000 Units by mouth daily.     [provider]  flecainide  (TAMBOCOR) 50 MG tablet Take 1 tablet (50 mg total) by mouth 2 (two) times daily. 03/23/23   Mealor, Roberts Gaudy, MD  guaiFENesin (MUCINEX) 600 MG 12 hr tablet Up to 2 every 12 hours as needed Patient taking differently: Up to 2 tablets every 12 hours as needed 02/10/23   Nyoka Cowden, MD  metoprolol tartrate (LOPRESSOR) 25 MG tablet Take 1 tablet (25 mg total) by mouth 2 (two) times daily. 08/21/21   Gwenlyn Fudge, FNP  montelukast (SINGULAIR) 10 MG tablet Take 1 tablet (10 mg total) by mouth at bedtime. 08/21/21   Gwenlyn Fudge, FNP  MYRBETRIQ 50 MG TB24 tablet Take 50 mg by mouth daily.    [provider]  OXYGEN Inhale 2 L into the lungs continuous.    [provider]  pantoprazole (PROTONIX) 40 MG tablet TAKE 1 TABLET DAILY 11/14/21   Gwenlyn Fudge, FNP  VASCEPA 1 g capsule TAKE (2) CAPSULES TWICE DAILY. 10/15/21   Gwenlyn Fudge, FNP  vitamin B-12 (CYANOCOBALAMIN) 100 MCG tablet Take 100 mcg by mouth daily.    [provider]      Allergies    Penicillins, Dilaudid [hydromorphone hcl], and Statins    Review of Systems   Review of Systems  Respiratory:  Positive for shortness of breath.   All other systems reviewed and are negative.   Physical Exam Updated Vital Signs BP 114/72 (BP Location: Right  Arm)   Pulse 69   Temp 98.5 F (36.9 C) (Oral)   Resp 11   Ht 1.676 m (5\' 6" )   Wt 90.7 kg   SpO2 100%   BMI 32.28 kg/m  Physical Exam Vitals and nursing note reviewed.  Constitutional:      General: She is not in acute distress.    Appearance: She is well-developed.  HENT:     Head: Normocephalic and atraumatic.     Mouth/Throat:     Pharynx: No oropharyngeal exudate.  Eyes:     General: No scleral icterus.       Right eye: No discharge.        Left eye: No discharge.     Conjunctiva/sclera: Conjunctivae normal.     Pupils: Pupils are equal, round, and reactive to light.  Neck:     Thyroid: No thyromegaly.     Vascular: No JVD.   Cardiovascular:     Rate and Rhythm: Normal rate and regular rhythm.     Heart sounds: Normal heart sounds. No murmur heard.    No friction rub. No gallop.  Pulmonary:     Effort: Pulmonary effort is normal. No respiratory distress.     Breath sounds: Wheezing present. No rales.  Abdominal:     General: Bowel sounds are normal. There is no distension.     Palpations: Abdomen is soft. There is no mass.     Tenderness: There is no abdominal tenderness.  Musculoskeletal:        General: No tenderness. Normal range of motion.     Cervical back: Normal range of motion and neck supple.     Right lower leg: No edema.     Left lower leg: No edema.  Lymphadenopathy:     Cervical: No cervical adenopathy.  Skin:    General: Skin is warm and dry.     Findings: No erythema or rash.  Neurological:     Mental Status: She is alert.     Coordination: Coordination normal.  Psychiatric:        Behavior: Behavior normal.     ED Results / Procedures / Treatments   Labs (all labs ordered are listed, but only abnormal results are displayed) Labs Reviewed  COMPREHENSIVE METABOLIC PANEL - Abnormal; Notable for the following components:      Result Value   Glucose, Bld 150 (*)    All other components within normal limits  RESP PANEL BY RT-PCR (RSV, FLU A&B, COVID)  [id number]  CBC WITH DIFFERENTIAL/PLATELET  BRAIN NATRIURETIC PEPTIDE    EKG EKG Interpretation Date/Time:  Sunday January 17 2024 20:28:31 EDT Ventricular Rate:  74 PR Interval:  146 QRS Duration:  100 QT Interval:  415 QTC Calculation: 461 R Axis:   -1  Text Interpretation: Sinus rhythm RSR' in V1 or V2, right VCD or RVH Confirmed by Eber Hong ([id number]) on 01/17/2024 9:22:09 PM  Radiology DG Chest 2 View Result Date: 01/17/2024 CLINICAL DATA:  Short of breath, wheezing EXAM: CHEST - 2 VIEW COMPARISON:  02/10/2023 FINDINGS: Frontal and lateral views of the chest demonstrate an unremarkable cardiac silhouette. Stable increased  reticulonodular opacities at the lung bases, compatible with sequela of chronic indolent infection as seen on recent CT exam 03/18/2023. No new consolidation, effusion, or pneumothorax. No acute bony abnormalities. IMPRESSION: 1. Stable bibasilar reticulonodular opacities compatible with sequela of chronic indolent infection as seen on previous CT exams. 2. No new airspace disease. Electronically Signed   By: Casimiro Needle  Manson Passey M.D.   On: 01/17/2024 21:51    Procedures Procedures    Medications Ordered in ED Medications  albuterol (PROVENTIL) (2.5 MG/3ML) 0.083% nebulizer solution 10 mg (0 mg Nebulization Stopped 01/17/24 2136)  methylPREDNISolone sodium succinate (SOLU-MEDROL) 125 mg/2 mL injection 125 mg (125 mg Intravenous Given 01/17/24 2101)    ED Course/ Medical Decision Making/ A&P                                 Medical Decision Making Amount and/or Complexity of Data Reviewed Labs: ordered. Radiology: ordered.  Risk Prescription drug management.   The patient is moving good air but has expiratory wheezing.  No rhonchi or rales.  Shortness of breath This patient presents to the ED for concern of shortness of breath differential diagnosis includes COPD exacerbation, pneumonia, pneumothorax, CHF, pulmonary edema, viral syndrome    Additional history obtained:  Additional history obtained from medical record External records from outside source obtained and reviewed including prior office visits, follows with pulmonary, follows with primary care and cardiology, no recent admissions to the hospital   Lab Tests:  I Ordered, and personally interpreted labs.  The pertinent results include: CBC without leukocytosis or anemia, metabolic panel is normal, BNP is normal, COVID and flu negative   Imaging Studies ordered:  I ordered imaging studies including chest x-ray I independently visualized and interpreted imaging which showed stable bibasilar reticulonodular opacities, no  significant changes I agree with the radiologist interpretation   Medicines ordered and prescription drug management:  I ordered medication including albuterol treatment for shortness of breath, Solu-Medrol Reevaluation of the patient after these medicines showed that the patient improved I have reviewed the patients home medicines and have made adjustments as needed   Problem List / ED Course:  At the time of discharge the patient's oxygen is 99% on her 2 L, she feels better, she has no shortness of breath, wheezing improved, she is agreeable to the plan   Social Determinants of Health:  None           Final Clinical Impression(s) / ED Diagnoses Final diagnoses:  COPD exacerbation (HCC)    Rx / DC Orders ED Discharge Orders          Ordered    predniSONE (DELTASONE) 20 MG tablet  Daily        01/17/24 2224              Eber Hong, MD 01/17/24 2225

## 2024-01-17 NOTE — ED Notes (Signed)
 Patient transported to X-ray

## 2024-01-17 NOTE — ED Triage Notes (Addendum)
 Pt from home complains of SOB, starting today. Chronic O2 @ 2L. States her sats have been getting as low as 80% while at rest. Also states that she has been having dyspnea with exertion, lightheadedness, and dizziness. Hx Afib and COPD, takes eliquis.

## 2024-01-18 ENCOUNTER — Encounter: Payer: Self-pay | Admitting: Cardiovascular Disease

## 2024-01-18 ENCOUNTER — Encounter: Payer: Self-pay | Admitting: Internal Medicine

## 2024-01-18 NOTE — Telephone Encounter (Signed)
 Spoke with patient, currently back into NSR. Reminded patient to continue medication as prescribed (eliquis, flecainide, metoprolol) and if any needs should arise to contact our office.

## 2024-01-19 ENCOUNTER — Encounter: Payer: Self-pay | Admitting: Internal Medicine

## 2024-01-21 NOTE — Telephone Encounter (Signed)
 I called the pt to get further information and there was no answer. Unable to leave msg. Will try back.

## 2024-01-25 NOTE — Telephone Encounter (Signed)
 Patient scheduled 02/09/24.

## 2024-01-26 NOTE — Progress Notes (Unsigned)
  Electrophysiology Office Note:   Date:  01/27/2024  ID:  [name], DOB [birth year], MRN [id number]  Primary Cardiologist: None Electrophysiologist: Maurice Small, MD      History of Present Illness:   [name] is a 73 y.o. female with h/o persistent fib/flutter s/p ablation, HLD, and COPD seen today for acute visit due to increasing AF frequency.    Patient reports 2 weeks of increased AF. Longest episode was about 4-5 hrs. Felt terrible while out of rhythm. Otherwise doing OK. Hasn't had any AF the past 2 days, but had paroxysms over the weekend. Currently, she denies chest pain, orthopnea, nausea, vomiting, syncope, edema, weight gain, or early satiety.   Review of systems complete and found to be negative unless listed in HPI.   EP Information / Studies Reviewed:    EKG is not ordered today. EKG from 01/17/2024 reviewed which showed NSR at 74 bpm       Arrhythmia/Device History S/p PVI  and CTI 05/2018   Amiodarone stopped after prio ablation  Physical Exam:   VS:  BP 130/64   Ht 5\' 6"  (1.676 m)   Wt 208 lb (94.3 kg)   SpO2 95%   BMI 33.57 kg/m    Wt Readings from Last 3 Encounters:  01/27/24 208 lb (94.3 kg)  01/17/24 200 lb (90.7 kg)  10/06/23 208 lb 6.4 oz (94.5 kg)     GEN: No acute distress NECK: No JVD; No carotid bruits CARDIAC: Regular rate and rhythm, no murmurs, rubs, gallops RESPIRATORY:  Clear to auscultation without rales, wheezing or rhonchi  ABDOMEN: Soft, non-tender, non-distended EXTREMITIES:  No edema; No deformity   ASSESSMENT AND PLAN:    Paroxysmal atrial fib Paroxysmal atrial flutter S/p CTI and PVI 2019 Continue flecainide 50 mg BID Regular on exam today.  Amiodarone a poor choice with O2 requirement.  Tikosyn is a reasonable option, but pt refuses admission consideration.  Continue eliquis 5 mg BID for CHA2DS2/VASc of at least 5 Change lopressor to toprol 25 mg BID to see if she gets better control.   If not, will  consider flecainide increase to 100 mg BID with close follow up.  She will 2 weeks prior to her next visit if she continues to have frequent AF.   Secondary hypercoagulable state Pt on Eliquis as above    Follow up with EP APP in 6-8 weeks. Pt to call 2 weeks beforehand if she is still having lots of AF, and will increase flecainide.   Signed, Graciella Freer, PA-C

## 2024-01-27 ENCOUNTER — Encounter: Payer: Self-pay | Admitting: Student

## 2024-01-27 ENCOUNTER — Ambulatory Visit: Attending: Student | Admitting: Student

## 2024-01-27 VITALS — BP 130/64 | Ht 66.0 in | Wt 208.0 lb

## 2024-01-27 DIAGNOSIS — I5189 Other ill-defined heart diseases: Secondary | ICD-10-CM | POA: Diagnosis not present

## 2024-01-27 DIAGNOSIS — I10 Essential (primary) hypertension: Secondary | ICD-10-CM | POA: Diagnosis not present

## 2024-01-27 DIAGNOSIS — I4819 Other persistent atrial fibrillation: Secondary | ICD-10-CM

## 2024-01-27 MED ORDER — METOPROLOL SUCCINATE ER 25 MG PO TB24: 25.0000 mg | ORAL_TABLET | Freq: Two times a day (BID) | ORAL | 3 refills | Status: DC

## 2024-01-27 NOTE — Patient Instructions (Addendum)
 Medication Instructions:  1.STOP metoprolol tartrate (Lopressor) 2.START metoprolol succinate (Toprol XL) 25 mg twice daily *If you need a refill on your cardiac medications before your next appointment, please call your pharmacy*  Lab Work: None ordered If you have labs (blood work) drawn today and your tests are completely normal, you will receive your results only by: MyChart Message (if you have MyChart) OR A paper copy in the mail If you have any lab test that is abnormal or we need to change your treatment, we will call you to review the results.  Follow-Up: At [facility], you and your health needs are our priority.  As part of our continuing mission to provide you with exceptional heart care, we have created designated Provider Care Teams.  These Care Teams include your primary Cardiologist (physician) and Advanced Practice Providers (APPs -  Physician Assistants and Nurse Practitioners) who all work together to provide you with the care you need, when you need it.  Your next appointment:   6-8 week(s)  Provider:   Casimiro Needle "Otilio Saber, PA-C

## 2024-02-07 NOTE — Progress Notes (Unsigned)
 Subjective:   Patient ID: [name], female    DOB: 1950-11-04    MRN: [id number]    Brief patient profile:  73 yowf quit smoking in 1991 and dx as copd 1993  in [facility] pulmonary clinic referred back to [facility] clinic 09/05/2013  By Dr Marie Gu PA [name]  for eval of abn CXR  History of Present Illness  09/05/2013 1st [facility] office visit/ [name] cc acute onset shaking chills, cough/ gag 08/14/13 admitted to [facility] and rx as CAP  but now  feeling 100% baseline doe on  spiriva/ advair/ singulair no 02 and some am cough > most the time not productive. rec Try stopping the pm dose of advair for two weeks then stop the am dose also and see if it makes any difference Continue spiriva one capsule daily  Only use your albuterol(proair) as rescue     02/10/2023  Re-establish  ov/[place] office/[name] re: GOLD 3 COPD  maint on trelegy 100  / 02 dep since 2020 never had covid  Chief Complaint  Patient presents with   Pulmonary Consult    Referred by Marie Seller, NP. Pt c/o increased DOE for the past 6 months, esp worse x 2 months.   Dyspnea:  limited by weak legs  and knees hurt so very inactive mostly housebound rides scooter when shops  Cough: still smoker's rattle esp in am  white mucus / sometimes severe cough / heaves gag sev times a week  Sleeping: flat bed / L side/ one pillow s resp cc  SABA use: inhaler once a day, neb 3-4 x times a week  02: 2lpm POC when out with sats in the 80s sometimes but does not titrate Rec Plan A = Automatic = Always=   Breztri Take 2 puffs first thing in am and then another 2 puffs about 12 hours later.   Work on inhaler technique:  Plan B = Backup (to supplement plan A, not to replace it) Only use your albuterol inhaler as a rescue medication  Plan C = Crisis (instead of Plan B but only if Plan B stops working) - only use your albuterol nebulizer if you first try Plan B  Plan D = Deltasone - ok to take if ABC not working   Prednisone 10 mg take  4 each am x 2 days,   2 each am x 2 days,  1 each am x 2 days and stop  For cough / congestion > mucinex 600 mg up to 2 every 12hours (total of 2400 mg in 24 hours)  Make sure you check your oxygen saturation  AT  your highest level of activity (not after you stop)   to be sure it stays over 90%    09/22/2023  f/u ov/[place] office/[name] re: COPD GOLD 3/ 02 dep  maint on breztri  Chief Complaint  Patient presents with   COPD   Shortness of Breath  Dyspnea:  improved able to do housework, ride scooter due to knees  Cough: mostly daytime / slt rattle whitish  Sleeping: flat bed one pillow s  resp cc  SABA use: once a day x one puff  02: 2-3lpm reports still drops as low 82% and does not titrate  Rec No change in medications  Make sure you check your oxygen saturation  AT  your highest level of activity (not after you stop)   to be sure it stays over 90% Please schedule a follow up visit  in 6  months but call sooner if needed     02/09/2024  f/u ov/[place] office/[name] re: GOLD 3 copd / 02 dep  maint on breztri and mints  Chief Complaint  Patient presents with   Shortness of Breath  Dyspnea:  room to room at home  Cough: white thick  Sleeping: flat bed one pillow no  resp cc  SABA use: neb tid  02: 3lpm at home and 2lpm daytime   No obvious day to day or daytime variability or assoc excess/ purulent sputum or mucus plugs or hemoptysis or cp or chest tightness, subjective wheeze or overt   hb symptoms.    Also denies any obvious fluctuation of symptoms with weather or environmental changes or other aggravating or alleviating factors except as outlined above   No unusual exposure hx or h/o childhood pna/ asthma or knowledge of premature birth.  Current Allergies, Complete Past Medical History, Past Surgical History, Family History, and Social History were reviewed in [employer] record.  ROS  The following are not active complaints  unless bolded Hoarseness, sore throat, dysphagia, dental problems, itching, sneezing,  nasal congestion or discharge of excess mucus or purulent secretions, ear ache,   fever, chills, sweats, unintended wt loss or wt gain, classically pleuritic or exertional cp,  orthopnea pnd or arm/hand swelling  or leg swelling, presyncope, palpitations, abdominal pain, anorexia, nausea, vomiting, diarrhea  or change in bowel habits or change in bladder habits, change in stools or change in urine, dysuria, hematuria,  rash, arthralgias, visual complaints, headache, numbness, weakness or ataxia or problems with walking or coordination,  change in mood or  memory.        Current Meds  Medication Sig   albuterol (ACCUNEB) 0.63 MG/3ML nebulizer solution Take 1 ampule by nebulization every 6 (six) hours as needed.   albuterol (PROAIR HFA) 108 (90 Base) MCG/ACT inhaler Inhale 2 puffs into the lungs every 6 (six) hours as needed for wheezing or shortness of breath.   apixaban (ELIQUIS) 5 MG TABS tablet Take 1 tablet (5 mg total) by mouth 2 (two) times daily.   Bempedoic Acid-Ezetimibe (NEXLIZET) 180-10 MG TABS Take 1 tablet by mouth daily.   Budeson-Glycopyrrol-Formoterol (BREZTRI AEROSPHERE) 160-9-4.8 MCG/ACT AERO Inhale 2 puffs into the lungs 2 (two) times daily.   Cholecalciferol (VITAMIN D3) 5000 UNITS CAPS Take 5,000 Units by mouth daily.    flecainide (TAMBOCOR) 50 MG tablet Take 1 tablet (50 mg total) by mouth 2 (two) times daily.   guaiFENesin (MUCINEX) 600 MG 12 hr tablet Up to 2 every 12 hours as needed (Patient taking differently: Up to 2 tablets every 12 hours as needed)   metoprolol succinate (TOPROL XL) 25 MG 24 hr tablet Take 1 tablet (25 mg total) by mouth 2 (two) times daily.   montelukast (SINGULAIR) 10 MG tablet Take 1 tablet (10 mg total) by mouth at bedtime.   MYRBETRIQ 50 MG TB24 tablet Take 50 mg by mouth daily.   OXYGEN Inhale 2 L into the lungs continuous.   pantoprazole (PROTONIX) 40 MG tablet  TAKE 1 TABLET DAILY   predniSONE (DELTASONE) 20 MG tablet Take 2 tablets (40 mg total) by mouth daily.   VASCEPA 1 g capsule TAKE (2) CAPSULES TWICE DAILY.   vitamin B-12 (CYANOCOBALAMIN) 100 MCG tablet Take 100 mcg by mouth daily.   Current Facility-Administered Medications for the 02/09/24 encounter (Office Visit) with Nyoka Cowden, MD  Medication   cyanocobalamin ((VITAMIN B-12)) injection 1,000  mcg               Objective:   Physical Exam  wts  02/09/2024         206  09/22/2023     204  03/26/2023       200  02/10/2023         201   10/03/13 213 lb (96.616 kg)  09/05/13 215 lb (97.523 kg)  08/30/13 213 lb (96.616 kg)      Vital signs reviewed  02/09/2024  - Note at rest 02 sats  91% on 2lpm    General appearance:    amb wf congested cough    HEENT : Oropharynx  clear/ edentulous   Nasal turbinates mod edema   NECK :  without  apparent JVD/ palpable Nodes/TM    LUNGS: no acc muscle use,  Mild barrel  contour chest wall with bilateral  Distant bs s exp rhonchi  and  without cough on insp or exp maneuvers  and mild  Hyperresonant  to  percussion bilaterally     CV:  RRR  no s3 or murmur or increase in P2, and no edema   ABD:  soft and nontender   MS:  Nl gait/ ext warm without deformities Or obvious joint restrictions  calf tenderness, cyanosis or clubbing     SKIN: warm and dry without lesions    NEURO:  alert, approp, nl sensorium with  no motor or cerebellar deficits apparent.           Assessment & Plan:

## 2024-02-09 ENCOUNTER — Ambulatory Visit (INDEPENDENT_AMBULATORY_CARE_PROVIDER_SITE_OTHER): Admitting: Internal Medicine

## 2024-02-09 ENCOUNTER — Encounter: Payer: Self-pay | Admitting: Internal Medicine

## 2024-02-09 VITALS — BP 143/84 | HR 69 | Ht 66.0 in | Wt 206.0 lb

## 2024-02-09 DIAGNOSIS — J449 Chronic obstructive pulmonary disease, unspecified: Secondary | ICD-10-CM

## 2024-02-09 DIAGNOSIS — K219 Gastro-esophageal reflux disease without esophagitis: Secondary | ICD-10-CM

## 2024-02-09 DIAGNOSIS — Z87891 Personal history of nicotine dependence: Secondary | ICD-10-CM

## 2024-02-09 DIAGNOSIS — J9611 Chronic respiratory failure with hypoxia: Secondary | ICD-10-CM | POA: Diagnosis not present

## 2024-02-09 MED ORDER — PREDNISONE 10 MG PO TABS: ORAL_TABLET | ORAL | 0 refills | Status: DC

## 2024-02-09 MED ORDER — FAMOTIDINE 20 MG PO TABS: ORAL_TABLET | ORAL | 11 refills | Status: AC

## 2024-02-09 NOTE — Patient Instructions (Addendum)
 Change plan D Prednisone 10 mg x 2 daily until better then 1 daily x 5 days and stop   For cough/ congestion >   mucinex dm  (or mucinex D if nose is clogged up too)  up to maximum of  1200 mg every 12 hours and use the flutter valve as much as you can    Pantoprazole (protonix) 40 mg   Take  30-60 min before first meal of the day and Pepcid (famotidine)  20 mg after supper until return to office - this is the best way to tell whether stomach acid is contributing to your problem.    GERD (REFLUX)  is an extremely common cause of respiratory symptoms just like yours , many times with no obvious heartburn at all.    It can be treated with medication, but also with lifestyle changes including elevation of the head of your bed (ideally with 6 -8inch blocks under the headboard of your bed),  Smoking cessation, avoidance of late meals, excessive alcohol, and avoid fatty foods, chocolate, peppermint, colas, red wine, and acidic juices such as orange juice.  NO MINT OR MENTHOL PRODUCTS SO NO COUGH DROPS  USE SUGARLESS CANDY INSTEAD (Jolley ranchers or Stover's or Life Savers) or even ice chips will also do - the key is to swallow to prevent all throat clearing. NO OIL BASED VITAMINS - use powdered substitutes.  Avoid fish oil when coughing.     Make sure you check your oxygen saturation  AT  your highest level of activity (not after you stop)   to be sure it stays over 90% and adjust  02 flow upward to maintain this level if needed but remember to turn it back to previous settings when you stop (to conserve your supply).    Keep prior appt

## 2024-02-10 NOTE — Assessment & Plan Note (Signed)
 Started 02  Feb 20220  - 09/22/2023   Walked on 2lpm POC  x  1  lap(s) =  approx 150  ft  @ slow pace, stopped due to knee pain > sob  with lowest 02 sats 92%  Advised: Make sure you check your oxygen saturation  AT  your highest level of activity (not after you stop)   to be sure it stays over 90% and adjust  02 flow upward to maintain this level if needed but remember to turn it back to previous settings when you stop (to conserve your supply).          Each maintenance medication was reviewed in detail including emphasizing most importantly the difference between maintenance and prns and under what circumstances the prns are to be triggered using an action plan format where appropriate.  Total time for H and P, chart review, counseling, reviewing hfa/neg/02/ pulse ox device(s) and generating customized AVS unique to this office visit / same day charting = 23 min

## 2024-02-10 NOTE — Assessment & Plan Note (Addendum)
 Quit smoking 1991/MM  - PFT's  FEV1  1.07 (40%) ratio 46 and 13% better p B2 and DLCO 61%   -  02/10/2023  After extensive coaching inhaler device,  effectiveness =    75% > try breztri over trelegy due to cough  - 03/26/2023  After extensive coaching inhaler device,  effectiveness =    80% with hfa so continue brezri 2bid and approp saba  - 02/10/23 Added Plan D = pred x 6 d cycles - Labs ordered 03/26/2023  :  allergy EOS  0.1   alpha one AT phenotype  MM  level 184   Group D (now reclassified as E) in terms of symptom/risk and laba/lama/ICS  therefore appropriate rx at this point >>>  breztri and approp saba plus new Plan D= pred 20 mg per day until better then 10 /d x 5 d and stop  Also add empirical gerd rx   always difficult to exclude in this setting  as up to 75% of pts in some series report no assoc GI/ Heartburn symptoms> rec max (24h)  acid suppression and diet restrictions/ reviewed and instructions given in writing.

## 2024-02-24 ENCOUNTER — Other Ambulatory Visit: Payer: Self-pay | Admitting: Internal Medicine

## 2024-02-24 DIAGNOSIS — J449 Chronic obstructive pulmonary disease, unspecified: Secondary | ICD-10-CM

## 2024-03-14 ENCOUNTER — Other Ambulatory Visit: Payer: Self-pay | Admitting: Cardiovascular Disease

## 2024-03-15 ENCOUNTER — Ambulatory Visit: Admitting: Student

## 2024-03-15 NOTE — Progress Notes (Unsigned)
  Electrophysiology Office Note:   Date:  03/16/2024  ID:  [name], DOB November 06, 1950, MRN [id number]  Primary Cardiologist: None Electrophysiologist: Efraim Grange, MD      History of Present Illness:   [name] is a 73 y.o. female with h/o persistent fib/flutter s/p ablation, HLD, and COPD seen today for routine electrophysiology followup.   Since last being seen in our clinic the patient reports doing well. Palpitations have settled out on Toprol . Only having for about 30 minutes once a week. Otherwise, she denies chest pain, dyspnea, PND, orthopnea, nausea, vomiting, dizziness, syncope, edema, weight gain, or early satiety.   Review of systems complete and found to be negative unless listed in HPI.   EP Information / Studies Reviewed:    EKG is ordered today. Personal review as below.  EKG Interpretation Date/Time:  Wednesday Mar 16 2024 10:17:46 EDT Ventricular Rate:  71 PR Interval:  166 QRS Duration:  90 QT Interval:  408 QTC Calculation: 443 R Axis:   50  Text Interpretation: Normal sinus rhythm Normal ECG When compared with ECG of 17-Jan-2024 20:28, PREVIOUS ECG IS PRESENT Confirmed by Pilar Bridge [phone]) on 03/16/2024 10:23:05 AM    Arrhythmia/Device History S/p PVI  and CTI 05/2018   Physical Exam:   VS:  BP 120/72   Pulse 71   Ht 5\' 6"  (1.676 m)   Wt 208 lb (94.3 kg)   SpO2 96%   BMI 33.57 kg/m    Wt Readings from Last 3 Encounters:  03/16/24 208 lb (94.3 kg)  02/09/24 206 lb (93.4 kg)  01/27/24 208 lb (94.3 kg)     GEN: No acute distress NECK: No JVD; No carotid bruits CARDIAC: Regular rate and rhythm, no murmurs, rubs, gallops RESPIRATORY:  Clear to auscultation without rales, wheezing or rhonchi  ABDOMEN: Soft, non-tender, non-distended EXTREMITIES:  No edema; No deformity   ASSESSMENT AND PLAN:    Paroxysmal atrial fib Paroxysmal atrial flutter S/p CTI and PVI 2019 Continue flecainide  50 mg BID EKG Today shows NSR with  stable intervals Amiodarone  a poor choice with O2 requirement.  Tikosyn is a reasonable option, but pt refuses admission consideration.  Continue eliquis  5 mg BID for CHA2DS2/VASc of at least 5 Continue toprol  25 mg BID   If AF increases in frequency discussed going up on flecainide  vs toprol    Secondary hypercoagulable state Pt on Eliquis  as above    Follow up with EP APP in 6 months  Signed, Tylene Galla, PA-C

## 2024-03-16 ENCOUNTER — Encounter: Payer: Self-pay | Admitting: Student

## 2024-03-16 ENCOUNTER — Ambulatory Visit: Attending: Student | Admitting: Student

## 2024-03-16 VITALS — BP 120/72 | HR 71 | Ht 66.0 in | Wt 208.0 lb

## 2024-03-16 DIAGNOSIS — I4819 Other persistent atrial fibrillation: Secondary | ICD-10-CM | POA: Diagnosis not present

## 2024-03-16 DIAGNOSIS — I10 Essential (primary) hypertension: Secondary | ICD-10-CM

## 2024-03-16 NOTE — Patient Instructions (Signed)
 Medication Instructions:  Your physician recommends that you continue on your current medications as directed. Please refer to the Current Medication list given to you today.  *If you need a refill on your cardiac medications before your next appointment, please call your pharmacy*  Lab Work: None ordered If you have labs (blood work) drawn today and your tests are completely normal, you will receive your results only by: MyChart Message (if you have MyChart) OR A paper copy in the mail If you have any lab test that is abnormal or we need to change your treatment, we will call you to review the results.  Follow-Up: At [facility], you and your health needs are our priority.  As part of our continuing mission to provide you with exceptional heart care, our providers are all part of one team.  This team includes your primary Cardiologist (physician) and Advanced Practice Providers or APPs (Physician Assistants and Nurse Practitioners) who all work together to provide you with the care you need, when you need it.  Your next appointment:   6 month(s)  Provider:   Bambi Lever "Michaelle Adolphus, PA-C

## 2024-03-20 NOTE — Progress Notes (Signed)
 Subjective:   Patient ID: [name], female    DOB: 1951/09/05    MRN: [id number]    Brief patient profile:  73 yowf quit smoking in 1991 and dx as copd 1993  in [facility] pulmonary clinic referred back to [facility] clinic 09/05/2013  By Dr Virgene Griffin PA [place]  for eval of abn CXR  History of Present Illness  09/05/2013 1st [facility] office visit/ Nyeem Stoke cc acute onset shaking chills, cough/ gag 08/14/13 admitted to [facility] and rx as CAP  but now  feeling 100% baseline doe on  spiriva / advair / singulair  no 02 and some am cough > most the time not productive. rec Try stopping the pm dose of advair  for two weeks then stop the am dose also and see if it makes any difference Continue spiriva  one capsule daily  Only use your albuterol (proair ) as rescue     02/10/2023  Re-establish  ov/[place] office/[name] re: GOLD 3 COPD  maint on trelegy 100  / 02 dep since 2020 never had covid  Chief Complaint  Patient presents with   Pulmonary Consult    Referred by Rainelle Bur, NP. Pt c/o increased DOE for the past 6 months, esp worse x 2 months.   Dyspnea:  limited by weak legs  and knees hurt so very inactive mostly housebound rides scooter when shops  Cough: still smoker's rattle esp in am  white mucus / sometimes severe cough / heaves gag sev times a week  Sleeping: flat bed / L side/ one pillow s resp cc  SABA use: inhaler once a day, neb 3-4 x times a week  02: 2lpm POC when out with sats in the 80s sometimes but does not titrate Rec Plan A = Automatic = Always=   Breztri  Take 2 puffs first thing in am and then another 2 puffs about 12 hours later.   Work on inhaler technique:  Plan B = Backup (to supplement plan A, not to replace it) Only use your albuterol  inhaler as a rescue medication  Plan C = Crisis (instead of Plan B but only if Plan B stops working) - only use your albuterol  nebulizer if you first try Plan B  Plan D = Deltasone  - ok to take if ABC not working   Prednisone  10 mg take  4 each am x 2 days,   2 each am x 2 days,  1 each am x 2 days and stop  For cough / congestion > mucinex  600 mg up to 2 every 12hours (total of 2400 mg in 24 hours)  Make sure you check your oxygen  saturation  AT  your highest level of activity (not after you stop)   to be sure it stays over 90%    09/22/2023  f/u ov/[place] office/[name] re: COPD GOLD 3/ 02 dep  maint on breztri   Chief Complaint  Patient presents with   COPD   Shortness of Breath  Dyspnea:  improved able to do housework, ride scooter due to knees  Cough: mostly daytime / slt rattle whitish  Sleeping: flat bed one pillow s  resp cc  SABA use: once a day x one puff  02: 2-3lpm reports still drops as low 82% and does not titrate  Rec No change in medications  Make sure you check your oxygen  saturation  AT  your highest level of activity (not after you stop)   to be sure it stays over 90% Please schedule a follow up visit  in 6  months but call sooner if needed     02/09/2024  f/u ov/[place] office/[name] re: GOLD 3 copd / 02 dep  maint on breztri  and mints  Chief Complaint  Patient presents with   Shortness of Breath  Dyspnea:  room to room at home  Cough: white thick  Sleeping: flat bed one pillow no  resp cc  SABA use: neb tid  02: 3lpm at home and 2lpm daytime Rec Change plan D Prednisone  10 mg x 2 daily until better then 1 daily x 5 days and stop  For cough/ congestion >   mucinex  dm  (or mucinex  D if nose is clogged up too)  up to maximum of  1200 mg every 12 hours and use the flutter valve as much as you can   Pantoprazole  (protonix ) 40 mg   Take  30-60 min before first meal of the day and Pepcid  (famotidine )  20 mg after supper .d GERD diet reviewed, bed blocks rec  Make sure you check your oxygen  saturation  AT  your highest level of activity (not after you stop)   to be sure it stays over 90%     03/22/2024  f/u ov/ office/[name] re: GOLD 3 copd  maint on breztri  and pred  as plan D none x 3 weeks Chief Complaint  Patient presents with   COPD   Dyspnea:  still rides buggy due to knees and back more than breathing limiting  Cough: none but rattles a bit  Sleeping: flat bed one pillow s noct  resp cc  SABA use: hfa bid  02: 2.5 conc while home and 3lpm POC but not really titrating at peak ex as rec    No obvious day to day or daytime variability or assoc excess/ purulent sputum or mucus plugs or hemoptysis or cp or chest tightness, subjective wheeze or overt sinus or hb symptoms.    Also denies any obvious fluctuation of symptoms with weather or environmental changes or other aggravating or alleviating factors except as outlined above   No unusual exposure hx or h/o childhood pna/ asthma or knowledge of premature birth.  Current Allergies, Complete Past Medical History, Past Surgical History, Family History, and Social History were reviewed in [employer] record.  ROS  The following are not active complaints unless bolded Hoarseness, sore throat, dysphagia, dental problems, itching, sneezing,  nasal congestion or discharge of excess mucus or purulent secretions, ear ache,   fever, chills, sweats, unintended wt loss or wt gain, classically pleuritic or exertional cp,  orthopnea pnd or arm/hand swelling  or leg swelling, presyncope, palpitations, abdominal pain, anorexia, nausea, vomiting, diarrhea  or change in bowel habits or change in bladder habits, change in stools or change in urine, dysuria, hematuria,  rash, arthralgias, visual complaints, headache, numbness, weakness or ataxia or problems with walking or coordination,  change in mood or  memory.        Current Meds  Medication Sig   albuterol  (ACCUNEB ) 0.63 MG/3ML nebulizer solution Take 1 ampule by nebulization every 6 (six) hours as needed.   albuterol  (PROAIR  HFA) 108 (90 Base) MCG/ACT inhaler Inhale 2 puffs into the lungs every 6 (six) hours as needed for wheezing or shortness  of breath.   apixaban  (ELIQUIS ) 5 MG TABS tablet Take 1 tablet (5 mg total) by mouth 2 (two) times daily.   Bempedoic Acid-Ezetimibe  (NEXLIZET ) 180-10 MG TABS Take 1 tablet by mouth daily.   BREZTRI  AEROSPHERE 160-9-4.8 MCG/ACT  AERO inhaler INHALE TWO puffs into THE lungs TWICE DAILY   Cholecalciferol (VITAMIN D3) 5000 UNITS CAPS Take 5,000 Units by mouth daily.    famotidine  (PEPCID ) 20 MG tablet One after supper   flecainide  (TAMBOCOR ) 50 MG tablet TAKE 1 TABLET 2 TIMES A DAY   guaiFENesin  (MUCINEX ) 600 MG 12 hr tablet Up to 2 every 12 hours as needed (Patient taking differently: Up to 2 tablets every 12 hours as needed)   metoprolol  succinate (TOPROL  XL) 25 MG 24 hr tablet Take 1 tablet (25 mg total) by mouth 2 (two) times daily.   montelukast  (SINGULAIR ) 10 MG tablet Take 1 tablet (10 mg total) by mouth at bedtime.   MYRBETRIQ  50 MG TB24 tablet Take 50 mg by mouth daily.   OXYGEN  Inhale 2 L into the lungs continuous.   pantoprazole  (PROTONIX ) 40 MG tablet TAKE 1 TABLET DAILY   predniSONE  (DELTASONE ) 10 MG tablet 2 daily until better then 1 daily x 5 days and stop   VASCEPA  1 g capsule TAKE (2) CAPSULES TWICE DAILY.   vitamin B-12 (CYANOCOBALAMIN ) 100 MCG tablet Take 100 mcg by mouth daily.   Current Facility-Administered Medications for the 03/22/24 encounter (Office Visit) with Diamond Formica, MD  Medication   cyanocobalamin  ((VITAMIN B-12)) injection 1,000 mcg                  Objective:   Physical Exam  wts  03/22/2024       206   02/09/2024         206  09/22/2023     204  03/26/2023       200  02/10/2023         201   10/03/13 213 lb (96.616 kg)  09/05/13 215 lb (97.523 kg)  08/30/13 213 lb (96.616 kg)      Vital signs reviewed  03/22/2024  - Note at rest 02 sats  92% on 2lpm POC    General appearance:    pleasant wmb 02/ slt rattling cough     HEENT : Oropharynx  clear   Nasal turbinates nl    NECK :  without  apparent JVD/ palpable Nodes/TM    LUNGS: no acc  muscle use,  Mild barrel  contour chest wall with bilateral  Distant bs s audible wheeze and  without cough on insp or exp maneuvers  and mild  Hyperresonant  to  percussion bilaterally     CV:  RRR  no s3 or murmur or increase in P2, and no edema   ABD:  mildly obese    MS:  Nl gait/ ext warm without deformities Or obvious joint restrictions  calf tenderness, cyanosis or clubbing     SKIN: warm and dry without lesions    NEURO:  alert, approp, nl sensorium with  no motor or cerebellar deficits apparent.    .           Assessment & Plan:

## 2024-03-22 ENCOUNTER — Encounter: Payer: Self-pay | Admitting: Internal Medicine

## 2024-03-22 ENCOUNTER — Ambulatory Visit: Payer: Medicare HMO | Admitting: Internal Medicine

## 2024-03-22 VITALS — BP 116/62 | HR 90 | Ht 66.0 in | Wt 206.4 lb

## 2024-03-22 DIAGNOSIS — J9611 Chronic respiratory failure with hypoxia: Secondary | ICD-10-CM

## 2024-03-22 DIAGNOSIS — Z87891 Personal history of nicotine dependence: Secondary | ICD-10-CM

## 2024-03-22 DIAGNOSIS — J449 Chronic obstructive pulmonary disease, unspecified: Secondary | ICD-10-CM | POA: Diagnosis not present

## 2024-03-22 MED ORDER — ALBUTEROL SULFATE HFA 108 (90 BASE) MCG/ACT IN AERS
2.0000 | INHALATION_SPRAY | RESPIRATORY_TRACT | 11 refills | Status: AC | PRN
Start: 2024-03-22 — End: ?

## 2024-03-22 NOTE — Patient Instructions (Addendum)
Make sure you check your oxygen saturation  AT  your highest level of activity (not after you stop)   to be sure it stays over 90% and adjust  02 flow upward to maintain this level if needed but remember to turn it back to previous settings when you stop (to conserve your supply).   No change in medications   Please schedule a follow up visit in 6  months but call sooner if needed  

## 2024-03-22 NOTE — Assessment & Plan Note (Signed)
 Quit smoking 1991/MM  - PFT's  FEV1  1.07 (40%) ratio 46 and 13% better p B2 and DLCO 61%   -  02/10/2023  After extensive coaching inhaler device,  effectiveness =    75% > try breztri  over trelegy due to cough  - 03/26/2023  After extensive coaching inhaler device,  effectiveness =    80% with hfa so continue brezri 2bid and approp saba  - 02/10/23 Added Plan D = pred x 6 d cycles - Labs ordered 03/26/2023  :  allergy EOS  0.1   alpha one AT phenotype  MM  level 184 -new Plan D= pred 20 mg per day until better then 10 /d x 5 d and stop   Group D (now reclassified as E) in terms of symptom/risk and laba/lama/ICS  therefore appropriate rx at this point >>>  breztri  plus approp saba:  Re SABA :  I spent extra time with pt today reviewing appropriate use of albuterol  for prn use on exertion with the following points: 1) saba is for relief of sob that does not improve by walking a slower pace or resting but rather if the pt does not improve after trying this first. 2) If the pt is convinced, as many are, that saba helps recover from activity faster then it's easy to tell if this is the case by re-challenging : ie stop, take the inhaler, then p 5 minutes try the exact same activity (intensity of workload) that just caused the symptoms and see if they are substantially diminished or not after saba 3) if there is an activity that reproducibly causes the symptoms, try the saba 15 min before the activity on alternate days   If in fact the saba really does help, then fine to continue to use it prn but advised may need to look closer at the maintenance regimen being used to achieve better control of airways disease with exertion.

## 2024-03-22 NOTE — Assessment & Plan Note (Signed)
 Started 02  Feb 20220  - 09/22/2023   Walked on 2lpm POC  x  1  lap(s) =  approx 150  ft  @ slow pace, stopped due to knee pain > sob  with lowest 02 sats 92%  Again advised:  Make sure you check your oxygen  saturation  AT  your highest level of activity (not after you stop)   to be sure it stays over 90% and adjust  02 flow upward to maintain this level if needed but remember to turn it back to previous settings when you stop (to conserve your supply).   F/u q 6 m sooner prn          Each maintenance medication was reviewed in detail including emphasizing most importantly the difference between maintenance and prns and under what circumstances the prns are to be triggered using an action plan format where appropriate.  Total time for H and P, chart review, counseling, reviewing hfa/ neb/ 02/ pulse ox  device(s) and generating customized AVS unique to this office visit / same day charting = 30 min

## 2024-05-05 ENCOUNTER — Encounter: Payer: Self-pay | Admitting: Internal Medicine

## 2024-05-11 ENCOUNTER — Other Ambulatory Visit: Payer: Self-pay

## 2024-05-11 DIAGNOSIS — J449 Chronic obstructive pulmonary disease, unspecified: Secondary | ICD-10-CM

## 2024-05-11 DIAGNOSIS — J9611 Chronic respiratory failure with hypoxia: Secondary | ICD-10-CM

## 2024-05-24 NOTE — Telephone Encounter (Unsigned)
 Copied from CRM [id number]. Topic: Clinical - Order For Equipment >> May 24, 2024  1:06 PM Nathanel DEL wrote: Reason for CRM: [facility] is calling w/ the pt on the other line.  They never received the order. In referrals it says it was sent to [facility].  Pt states she has never used [facility].  It needs to go to [facility].  Can you resend to the fax number below.  This number provided by [facility]  Fax [phone]

## 2024-05-25 ENCOUNTER — Other Ambulatory Visit: Payer: Self-pay

## 2024-05-25 DIAGNOSIS — J449 Chronic obstructive pulmonary disease, unspecified: Secondary | ICD-10-CM

## 2024-05-25 NOTE — Progress Notes (Signed)
 Am f

## 2024-06-04 ENCOUNTER — Other Ambulatory Visit: Payer: Self-pay | Admitting: Cardiovascular Disease

## 2024-07-28 ENCOUNTER — Encounter: Payer: Self-pay | Admitting: Internal Medicine

## 2024-07-28 DIAGNOSIS — J449 Chronic obstructive pulmonary disease, unspecified: Secondary | ICD-10-CM

## 2024-08-15 ENCOUNTER — Ambulatory Visit: Admitting: Internal Medicine

## 2024-09-20 ENCOUNTER — Ambulatory Visit: Attending: Student | Admitting: Nurse Practitioner

## 2024-09-20 ENCOUNTER — Encounter: Payer: Self-pay | Admitting: Student

## 2024-09-20 VITALS — BP 136/78 | HR 68 | Ht 66.0 in | Wt 209.7 lb

## 2024-09-20 DIAGNOSIS — D6869 Other thrombophilia: Secondary | ICD-10-CM

## 2024-09-20 DIAGNOSIS — Z79899 Other long term (current) drug therapy: Secondary | ICD-10-CM

## 2024-09-20 DIAGNOSIS — I480 Paroxysmal atrial fibrillation: Secondary | ICD-10-CM | POA: Diagnosis not present

## 2024-09-20 DIAGNOSIS — Z5181 Encounter for therapeutic drug level monitoring: Secondary | ICD-10-CM

## 2024-09-20 MED ORDER — METOPROLOL SUCCINATE ER 25 MG PO TB24: ORAL_TABLET | ORAL | 3 refills | Status: AC

## 2024-09-20 NOTE — Patient Instructions (Signed)
 Medication Instructions:  Increase metoprolol  to 50 mg (2 tablets) in the morning and 25 mg (1 tablet) in the evening. Call and let us  know if you have persistent palpitations. *If you need a refill on your cardiac medications before your next appointment, please call your pharmacy*  Lab Work: None ordered If you have labs (blood work) drawn today and your tests are completely normal, you will receive your results only by: MyChart Message (if you have MyChart) OR A paper copy in the mail If you have any lab test that is abnormal or we need to change your treatment, we will call you to review the results.  Follow-Up: At [facility], you and your health needs are our priority.  As part of our continuing mission to provide you with exceptional heart care, our providers are all part of one team.  This team includes your primary Cardiologist (physician) and Advanced Practice Providers or APPs (Physician Assistants and Nurse Practitioners) who all work together to provide you with the care you need, when you need it.  Your next appointment:   6 month(s)  Provider:   You may see Eulas FORBES Furbish, MD or one of the following Advanced Practice Providers on your designated Care Team:   Charlies Arthur, [place] Ozell Jodie Passey, PA-C Suzann Riddle, NP Daphne Barrack, NP Artist Pouch, PA-C

## 2024-09-20 NOTE — Progress Notes (Signed)
  Electrophysiology Office Note:   Date:  09/20/2024  ID:  [name], DOB 09/05/1951, MRN [id number]  Primary Cardiologist: None Electrophysiologist: Eulas FORBES Furbish, MD   Electrophysiologist:  Eulas FORBES Furbish, MD      History of Present Illness:   [name] is a 73 y.o. female with h/o persistent AF s/p ablation on 2019, HLD, COPD seen today for routine EP follow-up.  [name] was last seen on 03/16/2024 by Jodie Passey, PA and at that time she reported doing well with decrease and palpitations on Toprol -XL.  She was maintaining sinus rhythm by EKG and was continued on flecainide  50 mg twice daily.  [name] reports today that she has been doing well but has noticed some increased palpitations mainly occurring in the evening and lasting for around 1 hour.  She reports compliance with her current medications and denies any adverse reactions. Her blood pressure today was well-controlled and EKG intervals were appropriate for flecainide  therapy.  We discussed possible adjustment to her antiarrhythmics with Tikosyn as the next primary medicine if she is in agreement and if palpitations or breakthrough atrial fibrillation occur.   Review of systems complete and found to be negative unless listed in HPI.   EP Information / Studies Reviewed:    EKG is ordered today. Personal review as below.  EKG Interpretation Date/Time:  Tuesday September 20 2024 09:45:55 EST Ventricular Rate:  68 PR Interval:  150 QRS Duration:  98 QT Interval:  408 QTC Calculation: 433 R Axis:   60  Text Interpretation: Normal sinus rhythm Normal ECG Confirmed by Wyn Manus [phone]) on 09/20/2024 9:49:24 AM    Arrhythmia/Device History S/p PVI  and CTI 05/2018   Physical Exam:   VS:  There were no vitals taken for this visit.   Wt Readings from Last 3 Encounters:  03/22/24 206 lb 6.4 oz (93.6 kg)  03/16/24 208 lb (94.3 kg)  02/09/24 206 lb (93.4 kg)     GEN: No acute distress NECK: No  JVD; No carotid bruits CARDIAC: Regular rate and rhythm, no murmurs, rubs, gallops RESPIRATORY:  Clear to auscultation without rales, wheezing or rhonchi  ABDOMEN: Soft, non-tender, non-distended EXTREMITIES:  No edema; No deformity   ASSESSMENT AND PLAN:    CHA2DS2-VASc Score = 3   This indicates a 3.2% annual risk of stroke. The patient's score is based upon: CHF History: 0 HTN History: 0 Diabetes History: 0 Stroke History: 0 Vascular Disease History: 0 Age Score: 2 Gender Score: 1      Paroxysmal AF: -s/p ablation on 2019 and currently maintaining -In sinus rhythm today but reports episodes of breakthrough palpitations lasting 1 hour and mostly occurring in the evening. -We will discontinue Toprol -XL 25 mg twice daily and start Toprol  XL 50 mg in the morning and 25 mg in the evening -She will contact office if palpitations persist with a plan to add 50 mg in the evening at that time. -Metoprolol  25 twice increase 25 she is on a call 5 mg twice daily - Continue flecainide  50 mg twice daily  High risk medication management: -reports no new cardiac complaints or concerns -EKG intervals appropriate for flecainide  therapy  Secondary hypercoagulable state: -Dose appropriate based on age and weight -continue Eliquis  5 mg BID  @STWREVIEWED @  Follow up with EP APP in 6 months  Signed, Wyn Raddle, Manus Shove, NP

## 2024-09-22 ENCOUNTER — Encounter: Payer: Self-pay | Admitting: Internal Medicine

## 2024-09-22 ENCOUNTER — Ambulatory Visit: Admitting: Internal Medicine

## 2024-09-22 VITALS — BP 126/75 | HR 82 | Ht 66.0 in | Wt 207.0 lb

## 2024-09-22 DIAGNOSIS — J9611 Chronic respiratory failure with hypoxia: Secondary | ICD-10-CM | POA: Diagnosis not present

## 2024-09-22 DIAGNOSIS — J449 Chronic obstructive pulmonary disease, unspecified: Secondary | ICD-10-CM

## 2024-09-22 MED ORDER — PREDNISONE 10 MG PO TABS: ORAL_TABLET | ORAL | 2 refills | Status: AC

## 2024-09-22 NOTE — Assessment & Plan Note (Addendum)
 Quit smoking 1991/MM/ GOLD 3 group E COPD   - PFT's  FEV1  1.07 (40%) ratio 46 and 13% better p B2 and DLCO 61%   -  02/10/2023  After extensive coaching inhaler device,  effectiveness =    75% > try breztri  over trelegy due to cough  - 03/26/2023  After extensive coaching inhaler device,  effectiveness =    80% with hfa so continue brezri 2bid and approp saba  - 02/10/23 Added Plan D = pred x 6 d cycles - Labs ordered 03/26/2023  :  allergy EOS  0.1   alpha one AT phenotype  MM  level 184 - flutter valve 02/09/24 >>  - 09/22/2024  new plan  D = prednisone  10mg  x 2 each am until better then  daily x 5 days then 1/2 daily   Comment:   The goal with a chronic steroid dependent illness is always arriving at the lowest effective dose that controls the disease/symptoms and not accepting a set formula which is based on statistics or guidelines that don't always take into account patient  variability or the natural hx of the dz in every individual patient, which may well vary over time.  For now therefore I recommend the patient maintain  a ceiling ot 20 mg and a floor of 5 mg and consider adding nucala if can't wean the predisone furhter at her next ov   Also  Re SABA :  I spent extra time with pt today reviewing appropriate use of albuterol  for prn use on exertion with the following points: 1) saba is for relief of sob that does not improve by walking a slower pace or resting but rather if the pt does not improve after trying this first. 2) If the pt is convinced, as many are, that saba helps recover from activity faster then it's easy to tell if this is the case by re-challenging : ie stop, take the inhaler, then p 5 minutes try the exact same activity (intensity of workload) that just caused the symptoms and see if they are substantially diminished or not after saba 3) if there is an activity that reproducibly causes the symptoms, try the saba 15 min before the activity on alternate days   If in fact the  saba really does help, then fine to continue to use it prn but advised may need to look closer at the maintenance regimen being used to achieve better control of airways disease with exertion.   Each maintenance medication was reviewed in detail including emphasizing most importantly the difference between maintenance and prns and under what circumstances the prns are to be triggered using an action plan format where appropriate.  Total time for H and P, chart review, counseling, reviewing hfa/neb/ flutter  device(s) , directly observing portions of ambulatory 02 saturation study/ and generating customized AVS unique to this office visit / same day charting = 33 min

## 2024-09-22 NOTE — Patient Instructions (Addendum)
 When breathing / coughing getting worse start prednisone  10 mg x 2 each am until better then daily x 5 days then 1/2 daily - take for breakfast   For cough/ congestion > mucinex  or mucinex  dm  up to maximum of  1200 mg every 12 hours and use the flutter valve as much as you can    Make sure you check your oxygen  saturation  AT  your highest level of activity (not after you stop)   to be sure it stays over 90% and adjust  02 flow upward to maintain this level if needed but remember to turn it back to previous settings when you stop (to conserve your supply).   Please schedule a follow up visit in 3 months but call sooner if needed

## 2024-09-22 NOTE — Progress Notes (Signed)
 Subjective:   Patient ID: [name], female    DOB: August 26, 1951    MRN: [id number]   Brief patient profile:  73 yowf quit smoking in 1991 and dx as copd 1993  in [facility] pulmonary clinic referred back to [facility] clinic 09/05/2013  By Dr Dorthea Hector PA Clayborne Molt  for eval of abn CXR  History of Present Illness  09/05/2013 1st  Pulmonary office visit/ Sameera Betton cc acute onset shaking chills, cough/ gag 08/14/13 admitted to [facility] and rx as CAP  but now  feeling 100% baseline doe on  spiriva / advair / singulair  no 02 and some am cough > most the time not productive. rec Try stopping the pm dose of advair  for two weeks then stop the am dose also and see if it makes any difference Continue spiriva  one capsule daily  Only use your albuterol (proair ) as rescue     02/10/2023  Re-establish  ov/[place] office/Ahan Eisenberger re: GOLD 3 COPD  maint on trelegy 100  / 02 dep since 2020 never had covid  Chief Complaint  Patient presents with   Pulmonary Consult    Referred by Comer Reid, NP. Pt c/o increased DOE for the past 6 months, esp worse x 2 months.   Dyspnea:  limited by weak legs  and knees hurt so very inactive mostly housebound rides scooter when shops  Cough: still smoker's rattle esp in am  white mucus / sometimes severe cough / heaves gag sev times a week  Sleeping: flat bed / L side/ one pillow s resp cc  SABA use: inhaler once a day, neb 3-4 x times a week  02: 2lpm POC when out with sats in the 80s sometimes but does not titrate Rec Plan A = Automatic = Always=   Breztri  Take 2 puffs first thing in am and then another 2 puffs about 12 hours later.   Work on inhaler technique:  Plan B = Backup (to supplement plan A, not to replace it) Only use your albuterol  inhaler as a rescue medication  Plan C = Crisis (instead of Plan B but only if Plan B stops working) - only use your albuterol  nebulizer if you first try Plan B  Plan D = Deltasone  - ok to take if ABC not working   Prednisone  10 mg take  4 each am x 2 days,   2 each am x 2 days,  1 each am x 2 days and stop  For cough / congestion > mucinex  600 mg up to 2 every 12hours (total of 2400 mg in 24 hours)  Make sure you check your oxygen  saturation  AT  your highest level of activity (not after you stop)   to be sure it stays over 90%        02/09/2024  f/u ov/ office/[place] [facility] re: GOLD 3 copd / 02 dep  maint on breztri  and mints  Chief Complaint  Patient presents with   Shortness of Breath  Dyspnea:  room to room at home  Cough: white thick  Sleeping: flat bed one pillow no  resp cc  SABA use: neb tid  02: 3lpm at home and 2lpm daytime Rec Change plan D Prednisone  10 mg x 2 daily until better then 1 daily x 5 days and stop  For cough/ congestion >   mucinex  dm  (or mucinex  D if nose is clogged up too)  up to maximum of  1200 mg every 12 hours and use the flutter valve as  much as you can   Pantoprazole  (protonix ) 40 mg   Take  30-60 min before first meal of the day and Pepcid  (famotidine )  20 mg after supper .d GERD diet reviewed, bed blocks rec  Make sure you check your oxygen  saturation  AT  your highest level of activity (not after you stop)   to be sure it stays over 90%     03/22/2024  f/u ov/[place] office/Traeson Dusza re: GOLD 3 copd  maint on breztri  and pred as plan D none x 3 weeks Chief Complaint  Patient presents with   COPD   Dyspnea:  still rides buggy due to knees and back more than breathing limiting  Cough: none but rattles a bit  Sleeping: flat bed one pillow s noct  resp cc  SABA use: hfa bid  02: 2.5 conc while home and 3lpm POC but not really titrating at peak ex as rec  Rec Make sure you check your oxygen  saturation  AT  your highest level of activity (not after you stop)   to be sure it stays over 90%   No change in medications     09/22/2024  6 m  f/u ov/ office/Jaja Switalski re: GOLD 3 copd maint on Breztri    Chief Complaint  Patient presents with   COPD    Doe -  coughing AM  Dyspnea:  limited by by knees and back and breathing / rides scooter most stores and avoids those that don't have one / always improves while actively  on pred Cough: white worse in am x white mucus but not daily  Sleeping: falt bed one pillow no    resp cc  SABA use: just uses p out of breath - never pre-challenges  02: 2lpm hs and up to 4 lpm POC   Lung cancer screening: not eligible > 15 y out from smoking    No obvious day to day or daytime variability or assoc excess/ purulent sputum or mucus plugs or hemoptysis or cp or chest tightness, subjective wheeze or overt   hb symptoms.    Also denies any obvious fluctuation of symptoms with weather or environmental changes or other aggravating or alleviating factors except as outlined above   No unusual exposure hx or h/o childhood pna/ asthma or knowledge of premature birth.  Current Allergies, Complete Past Medical History, Past Surgical History, Family History, and Social History were reviewed in [employer] record.  ROS  The following are not active complaints unless bolded Hoarseness, sore throat, dysphagia, dental problems, itching, sneezing,  nasal congestion or discharge of excess mucus or purulent secretions, ear ache,   fever, chills, sweats, unintended wt loss or wt gain, classically pleuritic or exertional cp,  orthopnea pnd or arm/hand swelling  or leg swelling, presyncope, palpitations, abdominal pain, anorexia, nausea, vomiting, diarrhea  or change in bowel habits or change in bladder habits, change in stools or change in urine, dysuria, hematuria,  rash, arthralgias, visual complaints, headache, numbness, weakness or ataxia or problems with walking or coordination,  change in mood or  memory.        Current Meds  Medication Sig   albuterol  (ACCUNEB ) 0.63 MG/3ML nebulizer solution Take 1 ampule by nebulization every 6 (six) hours as needed.   albuterol  (PROAIR  HFA) 108 (90 Base) MCG/ACT  inhaler Inhale 2 puffs into the lungs every 4 (four) hours as needed for wheezing or shortness of breath.   apixaban  (ELIQUIS ) 5 MG TABS tablet Take 1 tablet (5  mg total) by mouth 2 (two) times daily.   Bempedoic Acid (NEXLETOL) 180 MG TABS Take 180 mg by mouth daily.   BREZTRI  AEROSPHERE 160-9-4.8 MCG/ACT AERO inhaler INHALE TWO puffs into THE lungs TWICE DAILY   Cholecalciferol (VITAMIN D3) 5000 UNITS CAPS Take 5,000 Units by mouth daily.    ezetimibe  (ZETIA ) 10 MG tablet Take 10 mg by mouth daily.   famotidine  (PEPCID ) 20 MG tablet One after supper   flecainide  (TAMBOCOR ) 50 MG tablet TAKE 1 TABLET 2 TIMES A DAY   guaiFENesin  (MUCINEX ) 600 MG 12 hr tablet Up to 2 every 12 hours as needed (Patient taking differently: Up to 2 tablets every 12 hours as needed)   metoprolol  succinate (TOPROL  XL) 25 MG 24 hr tablet Take 50 mg (2 tablets) by mouth in the morning and 25 mg (1 tablet) by mouth in the evening   montelukast  (SINGULAIR ) 10 MG tablet Take 1 tablet (10 mg total) by mouth at bedtime.   MYRBETRIQ  50 MG TB24 tablet Take 50 mg by mouth daily.   OXYGEN  Inhale 2 L into the lungs continuous.   pantoprazole  (PROTONIX ) 40 MG tablet TAKE 1 TABLET DAILY   VASCEPA  1 g capsule TAKE (2) CAPSULES TWICE DAILY.   vitamin B-12 (CYANOCOBALAMIN ) 100 MCG tablet Take 100 mcg by mouth daily.   Current Facility-Administered Medications for the 09/22/24 encounter (Office Visit) with Darlean Ozell NOVAK, MD  Medication   cyanocobalamin  ((VITAMIN B-12)) injection 1,000 mcg             Objective:   Physical Exam  wts  09/22/2024     207  03/22/2024       206   02/09/2024         206  09/22/2023     204  03/26/2023       200  02/10/2023         201   10/03/13 213 lb (96.616 kg)  09/05/13 215 lb (97.523 kg)  08/30/13 213 lb (96.616 kg)       Vital signs reviewed  09/22/2024  - Note at rest 02 sats  89% on 2lpm POC  General appearance:    mod obese (by bmi) amb wf congested sounding cough    HEENT :  Oropharynx  clear      NECK :  without  apparent JVD/ palpable Nodes/TM    LUNGS: no acc muscle use,  Mild barrel  contour chest wall with bilateral  Distant bs s audible wheeze and  without cough on insp or exp maneuvers  and mild  Hyperresonant  to  percussion bilaterally     CV:  RRR  no s3 or murmur or increase in P2, and no edema   ABD:  soft and nontender   MS:  Nl gait/ ext warm without deformities Or obvious joint restrictions  calf tenderness, cyanosis or clubbing     SKIN: warm and dry without lesions    NEURO:  alert, approp, nl sensorium with  no motor or cerebellar deficits apparent.    .           Assessment & Plan:   Assessment & Plan Chronic respiratory failure with hypoxia [facility]) Started 02  Feb 20220  - 09/22/2023   Walked on 2lpm POC  x  1  lap(s) =  approx 150  ft  @ slow pace, stopped due to knee pain > sob  with lowest 02 sats 92% - 09/22/2024   Walked on 4lpm POC  approx 75   ft  @ mod  pace, stopped due to machine malfuntioned p being turned to 4lpm at start of [place] with sats improved from 89% to 95% before malfunctioning > referred to DME for repair/replacement   Again advised re titration of 02 with goal of > 90% esp with walking since so sob walking and emphasized pacing as a way to avoid having to switch over to tanks if functioning POC not ablt to maintain adequate sats     COPD mixed type (HCC) Quit smoking 1991/MM/ GOLD 3 group E COPD   - PFT's  FEV1  1.07 (40%) ratio 46 and 13% better p B2 and DLCO 61%   -  02/10/2023  After extensive coaching inhaler device,  effectiveness =    75% > try breztri  over trelegy due to cough  - 03/26/2023  After extensive coaching inhaler device,  effectiveness =    80% with hfa so continue brezri 2bid and approp saba  - 02/10/23 Added Plan D = pred x 6 d cycles - Labs ordered 03/26/2023  :  allergy EOS  0.1   alpha one AT phenotype  MM  level 184 - flutter valve 02/09/24 >>  - 09/22/2024  new plan  D = prednisone   10mg  x 2 each am until better then  daily x 5 days then 1/2 daily   Comment:   The goal with a chronic steroid dependent illness is always arriving at the lowest effective dose that controls the disease/symptoms and not accepting a set formula which is based on statistics or guidelines that don't always take into account patient  variability or the natural hx of the dz in every individual patient, which may well vary over time.  For now therefore I recommend the patient maintain  a ceiling ot 20 mg and a floor of 5 mg and consider adding nucala if can't wean the predisone furhter at her next ov   Also  Re SABA :  I spent extra time with pt today reviewing appropriate use of albuterol  for prn use on exertion with the following points: 1) saba is for relief of sob that does not improve by walking a slower pace or resting but rather if the pt does not improve after trying this first. 2) If the pt is convinced, as many are, that saba helps recover from activity faster then it's easy to tell if this is the case by re-challenging : ie stop, take the inhaler, then p 5 minutes try the exact same activity (intensity of workload) that just caused the symptoms and see if they are substantially diminished or not after saba 3) if there is an activity that reproducibly causes the symptoms, try the saba 15 min before the activity on alternate days   If in fact the saba really does help, then fine to continue to use it prn but advised may need to look closer at the maintenance regimen being used to achieve better control of airways disease with exertion.   Each maintenance medication was reviewed in detail including emphasizing most importantly the difference between maintenance and prns and under what circumstances the prns are to be triggered using an action plan format where appropriate.  Total time for H and P, chart review, counseling, reviewing hfa/neb/ flutter  device(s) , directly observing portions of  ambulatory 02 saturation study/ and generating customized AVS unique to this office visit / same day charting = 33 min  AVS  Patient Instructions  When breathing / coughing getting worse start prednisone  10 mg x 2 each am until better then daily x 5 days then 1/2 daily - take for breakfast   For cough/ congestion > mucinex  or mucinex  dm  up to maximum of  1200 mg every 12 hours and use the flutter valve as much as you can    Make sure you check your oxygen  saturation  AT  your highest level of activity (not after you stop)   to be sure it stays over 90% and adjust  02 flow upward to maintain this level if needed but remember to turn it back to previous settings when you stop (to conserve your supply).   Please schedule a follow up visit in 3 months but call sooner if needed      Ozell America, MD 09/23/2024

## 2024-09-23 NOTE — Assessment & Plan Note (Addendum)
 Started 02  Feb 20220  - 09/22/2023   Walked on 2lpm POC  x  1  lap(s) =  approx 150  ft  @ slow pace, stopped due to knee pain > sob  with lowest 02 sats 92% - 09/22/2024   Walked on 4lpm POC    approx 75   ft  @ mod  pace, stopped due to machine malfuntioned p being turned to 4lpm at start of [place] with sats improved from 89% to 95% before malfunctioning > referred to DME for repair/replacement   Again advised re titration of 02 with goal of > 90% esp with walking since so sob walking and emphasized pacing as a way to avoid having to switch over to tanks if functioning POC not ablt to maintain adequate sats

## 2024-10-12 ENCOUNTER — Ambulatory Visit: Payer: Self-pay | Admitting: Internal Medicine

## 2024-10-12 NOTE — Telephone Encounter (Signed)
 [email or web address] Pulmonary Triage - Initial Assessment Questions Chief Complaint (e.g., cough, sob, wheezing, fever, chills, sweat or additional symptoms) *Go to specific symptom protocol after initial questions. Productive cough, congestion, intermittent wheezing  How long have symptoms been present? Saturday  Have you used your inhalers/maintenance medication? Yes If yes, What medications? Albuterol , maintenance  If inhaler, ask How many puffs and how often? Note: Review instructions on medication in the chart. 2 puffs twice daily  OXYGEN : Do you wear supplemental oxygen ? Yes If yes, How many liters are you supposed to use? 2L [place]  Do you monitor your oxygen  levels? Yes If yes, What is your reading (oxygen  level) today? 91% sitting, 87% standing; normal for me    Reason for Disposition  [1] Continuous (nonstop) coughing interferes with work or school AND [2] no improvement using cough treatment per Care Advice  Answer Assessment - Initial Assessment Questions Offered appt today, patient declines. Patient requesting doxycyline.  Advised UC/ED if symptoms worsen. Patient verbalized understanding.  1. ONSET: When did the cough begin?      Saturday 2. SEVERITY: How bad is the cough today?      Moderate to severe; 7/10 91% sitting and 87% walking; normal for me Oxygen  Inhalers Denies sob rest , chest pain Intermittent wheezing when laying down Deneis fever, chills n/v 3. SPUTUM: Describe the color of your sputum (e.g., none, dry cough; clear, white, yellow, green)     Green sputum 4. HEMOPTYSIS: Are you coughing up any blood? If Yes, ask: How much? (e.g., flecks, streaks, tablespoons, etc.)     no 5. DIFFICULTY BREATHING: Are you having difficulty breathing? If Yes, ask: How bad is it? (e.g., mild, moderate, severe)      mild 6. FEVER: Do you have a fever? If Yes, ask: What is your temperature, how was it measured, and when did it start?      Denies  7. CARDIAC HISTORY: Do you have any history of heart disease? (e.g., heart attack, congestive heart failure)     afib 8. LUNG HISTORY: Do you have any history of lung disease?  (e.g., pulmonary embolus, asthma, emphysema)     copd 9. PE RISK FACTORS: Do you have a history of blood clots? (or: recent major surgery, recent prolonged travel, bedridden)     no 10. OTHER SYMPTOMS: Do you have any other symptoms? (e.g., runny nose, wheezing, chest pain)       Denies chest pain, sore throat, dizziness, HA Runny nose  12. TRAVEL: Have you traveled out of the country in the last month? (e.g., travel history, exposures)       no  Protocols used: Cough - Acute Productive-A-AH Message from Isabell A sent at 10/12/2024  9:13 AM EST  Reason for Triage: Patient states she has a cold and would like doxycycline  without coming in for a visit.   Callback number: [phone]

## 2024-10-12 NOTE — Telephone Encounter (Signed)
 Need acute visit [facility] please   Productive cough, congestion, intermittent wheezing

## 2024-10-12 NOTE — Telephone Encounter (Signed)
 Called patient to schedule an acute visit and she stated that she is okay and she would go to urgent care if she got any worse.

## 2024-10-23 ENCOUNTER — Ambulatory Visit
Admission: EM | Admit: 2024-10-23 | Discharge: 2024-10-23 | Disposition: A | Discharge status: Home / Self Care | Attending: Family Medicine | Admitting: Family Medicine

## 2024-10-23 ENCOUNTER — Encounter: Payer: Self-pay | Admitting: Emergency Medicine

## 2024-10-23 DIAGNOSIS — N390 Urinary tract infection, site not specified: Secondary | ICD-10-CM | POA: Diagnosis not present

## 2024-10-23 LAB — POCT URINE DIPSTICK
Bilirubin, UA: NEGATIVE
Glucose, UA: NEGATIVE mg/dL
Ketones, POC UA: NEGATIVE mg/dL
Nitrite, UA: NEGATIVE
POC PROTEIN,UA: 100 — AB
Spec Grav, UA: 1.025
Urobilinogen, UA: 1 U/dL
pH, UA: 6.5

## 2024-10-23 MED ORDER — FLUCONAZOLE 150 MG PO TABS: 150.0000 mg | ORAL_TABLET | ORAL | 0 refills | Status: AC

## 2024-10-23 MED ORDER — SULFAMETHOXAZOLE-TRIMETHOPRIM 800-160 MG PO TABS: 1.0000 | ORAL_TABLET | Freq: Two times a day (BID) | ORAL | 0 refills | Status: AC

## 2024-10-23 NOTE — ED Provider Notes (Signed)
 " [facility]    CSN: [id number] Arrival date & time: 10/23/24  0809      History   Chief Complaint No chief complaint on file.   HPI [name] is a 73 y.o. female.   Patient presenting today with 1 day history of urinary frequency, urgency, dysuria, dark urine.  Denies fever, chills, abdominal pain, nausea vomiting or diarrhea.  So far not trying anything over-the-counter for symptoms.  Long history of UTIs.    Past Medical History:  Diagnosis Date   Colon polyps    COPD (chronic obstructive pulmonary disease) (HCC)    GERD (gastroesophageal reflux disease)    Hypercholesteremia    Mild mitral regurgitation    Mitral valve prolapse    a. dx 1985, not seen on echoes more recently.   Obesity    Paroxysmal atrial fibrillation (HCC)    Paroxysmal atrial flutter (HCC)    a. dx 05/2017.    Patient Active Problem List   Diagnosis Date Noted   Encounter for monitoring flecainide  therapy    Colonic mass    Diverticulosis of colon without hemorrhage    Oxygen  dependent    Adenomatous polyp of ascending colon    Polyp of sigmoid colon    Adenomatous polyp of transverse colon    Grade II internal hemorrhoids    OAB (overactive bladder) 09/26/2019   Lumbar paraspinal muscle spasm 04/13/2018   Lumbosacral radiculopathy 04/13/2018   Atrial fibrillation (HCC) 08/30/2013   Mild mitral valve regurgitation 08/30/2013   B12 deficiency 08/16/2013   COPD  GOLD III 08/14/2013   Obesity, unspecified 08/14/2013   Hypercholesteremia 08/14/2013   Chronic respiratory failure with hypoxia (HCC) 08/14/2013    Past Surgical History:  Procedure Laterality Date   ABDOMINAL HYSTERECTOMY     ATRIAL FIBRILLATION ABLATION N/A 05/04/2018   Procedure: ATRIAL FIBRILLATION ABLATION;  Surgeon: Kelsie Agent, MD;  Location: [facility];  Service: Cardiovascular;  Laterality: N/A;   BIOPSY  05/30/2021   Procedure: BIOPSY;  Surgeon: San Sandor GAILS, DO;  Location: [facility]  ENDOSCOPY;  Service: Gastroenterology;;   CARDIOVERSION N/A 10/08/2017   Procedure: CARDIOVERSION;  Surgeon: Charls Pearla LABOR, MD;  Location: [facility];  Service: Cardiovascular;  Laterality: N/A;   CESAREAN SECTION     COLONOSCOPY WITH PROPOFOL  N/A 05/30/2021   Procedure: COLONOSCOPY WITH PROPOFOL ;  Surgeon: San Sandor GAILS, DO;  Location: [facility];  Service: Gastroenterology;  Laterality: N/A;   HEMOSTASIS CLIP PLACEMENT  05/30/2021   Procedure: HEMOSTASIS CLIP PLACEMENT;  Surgeon: San Sandor GAILS, DO;  Location: [facility];  Service: Gastroenterology;;   POLYPECTOMY  05/30/2021   Procedure: POLYPECTOMY;  Surgeon: San Sandor GAILS, DO;  Location: [facility];  Service: Gastroenterology;;   SUBMUCOSAL TATTOO INJECTION  05/30/2021   Procedure: SUBMUCOSAL TATTOO INJECTION;  Surgeon: San Sandor GAILS, DO;  Location: [facility];  Service: Gastroenterology;;   TEE WITHOUT CARDIOVERSION N/A 05/03/2018   Procedure: TRANSESOPHAGEAL ECHOCARDIOGRAM (TEE);  Surgeon: Okey Vina GAILS, MD;  Location: [facility] ENDOSCOPY;  Service: Cardiovascular;  Laterality: N/A;   TUBAL LIGATION      OB History   No obstetric history on file.      Home Medications    Prior to Admission medications  Medication Sig Start Date End Date Taking? Authorizing Provider  fluconazole  (DIFLUCAN ) 150 MG tablet Take 1 tablet (150 mg total) by mouth every other day. 10/23/24  Yes Stuart Vernell Norris, PA-C  sulfamethoxazole -trimethoprim  (BACTRIM  DS) 800-160 MG tablet Take 1 tablet  by mouth 2 (two) times daily for 5 days. 10/23/24 10/28/24 Yes Stuart Vernell Norris, PA-C  albuterol  (ACCUNEB ) 0.63 MG/3ML nebulizer solution Take 1 ampule by nebulization every 6 (six) hours as needed. 01/19/24   [provider]  albuterol  (PROAIR  HFA) 108 (90 Base) MCG/ACT inhaler Inhale 2 puffs into the lungs every 4 (four) hours as needed for wheezing or shortness of breath. 03/22/24   Darlean Ozell NOVAK, MD  apixaban  (ELIQUIS )  5 MG TABS tablet Take 1 tablet (5 mg total) by mouth 2 (two) times daily. 08/21/21   Merlynn Niki FALCON, FNP  Bempedoic Acid (NEXLETOL) 180 MG TABS Take 180 mg by mouth daily.    [provider]  BREZTRI  AEROSPHERE 160-9-4.8 MCG/ACT AERO inhaler INHALE TWO puffs into THE lungs TWICE DAILY 02/25/24   Wert, Michael B, MD  Cholecalciferol (VITAMIN D3) 5000 UNITS CAPS Take 5,000 Units by mouth daily.     [provider]  ezetimibe  (ZETIA ) 10 MG tablet Take 10 mg by mouth daily.    [provider]  famotidine  (PEPCID ) 20 MG tablet One after supper 02/09/24   Darlean Ozell NOVAK, MD  flecainide  (TAMBOCOR ) 50 MG tablet TAKE 1 TABLET 2 TIMES A DAY 06/07/24   Lesia Ozell Barter, PA-C  guaiFENesin  (MUCINEX ) 600 MG 12 hr tablet Up to 2 every 12 hours as needed Patient taking differently: Up to 2 tablets every 12 hours as needed 02/10/23   Darlean Ozell NOVAK, MD  metoprolol  succinate (TOPROL  XL) 25 MG 24 hr tablet Take 50 mg (2 tablets) by mouth in the morning and 25 mg (1 tablet) by mouth in the evening 09/20/24   Wyn Jackee VEAR Mickey., NP  montelukast  (SINGULAIR ) 10 MG tablet Take 1 tablet (10 mg total) by mouth at bedtime. 08/21/21   Merlynn Niki FALCON, FNP  MYRBETRIQ  50 MG TB24 tablet Take 50 mg by mouth daily.    [provider]  OXYGEN  Inhale 2 L into the lungs continuous.    [provider]  pantoprazole  (PROTONIX ) 40 MG tablet TAKE 1 TABLET DAILY 11/14/21   Merlynn Niki FALCON, FNP  predniSONE  (DELTASONE ) 10 MG tablet prednisone  10mg  x 2 each am until better then  daily x 5 days then 1/2 daily Take as directed 09/22/24   Darlean Ozell NOVAK, MD  VASCEPA  1 g capsule TAKE (2) CAPSULES TWICE DAILY. 10/15/21   Merlynn Niki FALCON, FNP  vitamin B-12 (CYANOCOBALAMIN ) 100 MCG tablet Take 100 mcg by mouth daily.    [provider]    Family History Family History  Problem Relation Age of Onset   Heart attack Mother 36   Asthma Mother    Emphysema Father        smoked   Heart  disease Father    Atrial fibrillation Brother    Colon polyps Brother    Cancer Maternal Grandfather        type unknown   Hypertension Son    Atrial fibrillation Son    Colon cancer Neg Hx    Pancreatic cancer Neg Hx    Esophageal cancer Neg Hx     Social History Social History[1]   Allergies   Penicillins, Dilaudid  [hydromorphone  hcl], and Statins   Review of Systems Review of Systems Per HPI  Physical Exam Triage Vital Signs ED Triage Vitals  Encounter Vitals Group     BP 10/23/24 0821 (!) 146/69     Girls Systolic BP Percentile --      Girls Diastolic BP Percentile --  Boys Systolic BP Percentile --      Boys Diastolic BP Percentile --      Pulse Rate 10/23/24 0821 70     Resp 10/23/24 0821 20     Temp 10/23/24 0821 97.9 F (36.6 C)     Temp src --      SpO2 10/23/24 0821 90 %     Weight --      Height --      Head Circumference --      Peak Flow --      Pain Score 10/23/24 0823 6     Pain Loc --      Pain Education --      Exclude from Growth Chart --    No data found.  Updated Vital Signs BP (!) 146/69 (BP Location: Right Arm)   Pulse 70   Temp 97.9 F (36.6 C)   Resp 20   SpO2 90%   Visual Acuity Right Eye Distance:   Left Eye Distance:   Bilateral Distance:    Right Eye Near:   Left Eye Near:    Bilateral Near:     Physical Exam Vitals and nursing note reviewed.  Constitutional:      Appearance: Normal appearance. She is not ill-appearing.  HENT:     Head: Atraumatic.  Eyes:     Extraocular Movements: Extraocular movements intact.     Conjunctiva/sclera: Conjunctivae normal.  Cardiovascular:     Rate and Rhythm: Normal rate.  Pulmonary:     Effort: Pulmonary effort is normal.  Abdominal:     General: Bowel sounds are normal. There is no distension.     Palpations: Abdomen is soft.     Tenderness: There is no abdominal tenderness. There is no right CVA tenderness, left CVA tenderness or guarding.  Musculoskeletal:         General: Normal range of motion.     Cervical back: Normal range of motion and neck supple.  Skin:    General: Skin is warm and dry.  Neurological:     Mental Status: She is alert and oriented to person, place, and time.  Psychiatric:        Mood and Affect: Mood normal.        Thought Content: Thought content normal.        Judgment: Judgment normal.      UC Treatments / Results  Labs (all labs ordered are listed, but only abnormal results are displayed) Labs Reviewed  POCT URINE DIPSTICK - Abnormal; Notable for the following components:      Result Value   Color, UA orange (*)    Clarity, UA cloudy (*)    Blood, UA large (*)    POC PROTEIN,UA =100 (*)    Leukocytes, UA Small (1+) (*)    All other components within normal limits  URINE CULTURE    EKG   Radiology No results found.  Procedures Procedures (including critical care time)  Medications Ordered in UC Medications - No data to display  Initial Impression / Assessment and Plan / UC Course  I have reviewed the triage vital signs and the nursing notes.  Pertinent labs & imaging results that were available during my care of the patient were reviewed by me and considered in my medical decision making (see chart for details).     Minimally hypertensive in triage, otherwise vital signs within normal limits.  She is well-appearing and in no acute distress.  Urinalysis today with evidence  of a urinary tract infection.  Urine culture pending, treat with Bactrim , Diflucan  as she states she tends to get yeast infections with antibiotics, Azo as needed, support over-the-counter medications and home care.  Return for worsening or unresolving symptoms.  Final Clinical Impressions(s) / UC Diagnoses   Final diagnoses:  Acute lower UTI     Discharge Instructions      I have sent out for a urine culture that will let us  know if you are on an appropriate medication.  If we need to make any changes we will let you know.   I have sent some Diflucan  for you to take every other day as needed in case you begin having any vaginal itching or irritation as you state this is an issue that you have had in the past.  Drink plenty of fluids, take Azo as needed for burning.  Return for worsening or unresolving symptoms    ED Prescriptions     Medication Sig Dispense Auth. Provider   sulfamethoxazole -trimethoprim  (BACTRIM  DS) 800-160 MG tablet Take 1 tablet by mouth 2 (two) times daily for 5 days. 10 tablet Stuart Vernell Norris, PA-C   fluconazole  (DIFLUCAN ) 150 MG tablet Take 1 tablet (150 mg total) by mouth every other day. 3 tablet Stuart Vernell Norris, [place]      PDMP not reviewed this encounter.    [1]  Social History Tobacco Use   Smoking status: Former    Current packs/day: 0.00    Average packs/day: 1 pack/day for 20.0 years (20.0 ttl pk-yrs)    Types: Cigarettes    Start date: 08/14/1970    Quit date: 08/14/1990    Years since quitting: 34.2    Passive exposure: Never   Smokeless tobacco: Never  Vaping Use   Vaping status: Never Used  Substance Use Topics   Alcohol use: No    Alcohol/week: 0.0 standard drinks of alcohol   Drug use: No     Stuart Vernell Norris, PA-C 10/23/24 1032  "

## 2024-10-23 NOTE — Discharge Instructions (Signed)
 I have sent out for a urine culture that will let us  know if you are on an appropriate medication.  If we need to make any changes we will let you know.  I have sent some Diflucan  for you to take every other day as needed in case you begin having any vaginal itching or irritation as you state this is an issue that you have had in the past.  Drink plenty of fluids, take Azo as needed for burning.  Return for worsening or unresolving symptoms

## 2024-10-23 NOTE — ED Triage Notes (Signed)
 Urinary urgency and burning on urination that started this morning.

## 2024-10-24 LAB — URINE CULTURE: Culture: <10000 — AB

## 2024-10-25 ENCOUNTER — Ambulatory Visit: Payer: Self-pay

## 2024-11-21 ENCOUNTER — Other Ambulatory Visit: Payer: Self-pay

## 2024-11-21 ENCOUNTER — Telehealth: Payer: Self-pay

## 2024-11-21 DIAGNOSIS — J9611 Chronic respiratory failure with hypoxia: Secondary | ICD-10-CM

## 2024-11-21 DIAGNOSIS — J449 Chronic obstructive pulmonary disease, unspecified: Secondary | ICD-10-CM

## 2024-11-21 NOTE — Telephone Encounter (Signed)
 Copied from CRM [id number]. Topic: Clinical - Order For Equipment >> Nov 16, 2024  3:25 PM LaVerne A wrote: Reason for CRM: Patient was getting her oxygen  supplies from [facility] but they said they were not in network with her insurance.  She would like for Dr. Darlean to send a new prescription for her supplies to [facility].  Please let patient know when this has been done by calling her at [phone].  Thanks.  Sent order for new o2 supplies to [facility]

## 2024-12-06 ENCOUNTER — Other Ambulatory Visit: Payer: Self-pay | Admitting: Student

## 2024-12-08 NOTE — Telephone Encounter (Signed)
 In accordance with refill protocols, please review and address the following requirements before this medication refill can be authorized:  Labs  Pt needs labs within 180 days within normal range

## 2024-12-09 ENCOUNTER — Other Ambulatory Visit: Payer: Self-pay | Admitting: Internal Medicine

## 2024-12-23 ENCOUNTER — Ambulatory Visit: Admitting: Internal Medicine

## 2025-03-20 ENCOUNTER — Ambulatory Visit: Admitting: Student
